# Patient Record
Sex: Male | Born: 1950 | Race: White | Hispanic: No | Marital: Married | Smoking: Former smoker
Health system: Southern US, Community
[De-identification: ages and names within clinical notes are randomized; demographics above are authoritative.]

## PROBLEM LIST (undated history)

## (undated) DIAGNOSIS — K5790 Diverticulosis of intestine, part unspecified, without perforation or abscess without bleeding: Secondary | ICD-10-CM

## (undated) DIAGNOSIS — Z8719 Personal history of other diseases of the digestive system: Secondary | ICD-10-CM

## (undated) DIAGNOSIS — J189 Pneumonia, unspecified organism: Secondary | ICD-10-CM

## (undated) DIAGNOSIS — K449 Diaphragmatic hernia without obstruction or gangrene: Secondary | ICD-10-CM

## (undated) DIAGNOSIS — Z974 Presence of external hearing-aid: Secondary | ICD-10-CM

## (undated) DIAGNOSIS — R269 Unspecified abnormalities of gait and mobility: Secondary | ICD-10-CM

## (undated) DIAGNOSIS — J310 Chronic rhinitis: Secondary | ICD-10-CM

## (undated) DIAGNOSIS — M1990 Unspecified osteoarthritis, unspecified site: Secondary | ICD-10-CM

## (undated) DIAGNOSIS — H9190 Unspecified hearing loss, unspecified ear: Secondary | ICD-10-CM

## (undated) DIAGNOSIS — G039 Meningitis, unspecified: Secondary | ICD-10-CM

## (undated) DIAGNOSIS — K08109 Complete loss of teeth, unspecified cause, unspecified class: Secondary | ICD-10-CM

## (undated) DIAGNOSIS — F329 Major depressive disorder, single episode, unspecified: Secondary | ICD-10-CM

## (undated) DIAGNOSIS — R7303 Prediabetes: Secondary | ICD-10-CM

## (undated) DIAGNOSIS — I498 Other specified cardiac arrhythmias: Secondary | ICD-10-CM

## (undated) DIAGNOSIS — I493 Ventricular premature depolarization: Secondary | ICD-10-CM

## (undated) DIAGNOSIS — F32A Depression, unspecified: Secondary | ICD-10-CM

## (undated) DIAGNOSIS — I77810 Thoracic aortic ectasia: Secondary | ICD-10-CM

## (undated) DIAGNOSIS — J302 Other seasonal allergic rhinitis: Secondary | ICD-10-CM

## (undated) DIAGNOSIS — Z860100 Personal history of colon polyps, unspecified: Secondary | ICD-10-CM

## (undated) DIAGNOSIS — Z9289 Personal history of other medical treatment: Secondary | ICD-10-CM

## (undated) DIAGNOSIS — N400 Enlarged prostate without lower urinary tract symptoms: Secondary | ICD-10-CM

## (undated) DIAGNOSIS — L409 Psoriasis, unspecified: Secondary | ICD-10-CM

## (undated) DIAGNOSIS — Z972 Presence of dental prosthetic device (complete) (partial): Secondary | ICD-10-CM

## (undated) DIAGNOSIS — I10 Essential (primary) hypertension: Secondary | ICD-10-CM

## (undated) DIAGNOSIS — K219 Gastro-esophageal reflux disease without esophagitis: Secondary | ICD-10-CM

## (undated) DIAGNOSIS — R3915 Urgency of urination: Secondary | ICD-10-CM

## (undated) DIAGNOSIS — D494 Neoplasm of unspecified behavior of bladder: Secondary | ICD-10-CM

## (undated) DIAGNOSIS — Z8619 Personal history of other infectious and parasitic diseases: Secondary | ICD-10-CM

## (undated) HISTORY — DX: Gastro-esophageal reflux disease without esophagitis: K21.9

## (undated) HISTORY — PX: SPINAL FUSION: SHX223

## (undated) HISTORY — DX: Personal history of colonic polyps: Z86.010

## (undated) HISTORY — PX: MULTIPLE TOOTH EXTRACTIONS: SHX2053

## (undated) HISTORY — DX: Psoriasis, unspecified: L40.9

## (undated) HISTORY — DX: Depression, unspecified: F32.A

## (undated) HISTORY — DX: Major depressive disorder, single episode, unspecified: F32.9

## (undated) HISTORY — PX: INGUINAL HERNIA REPAIR: SUR1180

## (undated) HISTORY — DX: Diverticulosis of intestine, part unspecified, without perforation or abscess without bleeding: K57.90

## (undated) HISTORY — DX: Personal history of colon polyps, unspecified: Z86.0100

## (undated) HISTORY — PX: TONSILLECTOMY: SUR1361

## (undated) HISTORY — DX: Chronic rhinitis: J31.0

## (undated) HISTORY — PX: ANKLE ARTHROSCOPY: SUR85

## (undated) HISTORY — PX: COLONOSCOPY: SHX174

## (undated) HISTORY — PX: INGUINAL LYMPH NODE BIOPSY: SHX5865

---

## 2001-04-12 ENCOUNTER — Ambulatory Visit (HOSPITAL_COMMUNITY): Admission: RE | Admit: 2001-04-12 | Discharge: 2001-04-12 | Payer: Self-pay | Admitting: *Deleted

## 2002-05-23 ENCOUNTER — Encounter: Payer: Self-pay | Admitting: Dermatology

## 2002-05-23 ENCOUNTER — Ambulatory Visit (HOSPITAL_COMMUNITY): Admission: RE | Admit: 2002-05-23 | Discharge: 2002-05-23 | Payer: Self-pay | Admitting: Dermatology

## 2002-07-05 ENCOUNTER — Ambulatory Visit (HOSPITAL_BASED_OUTPATIENT_CLINIC_OR_DEPARTMENT_OTHER): Admission: RE | Admit: 2002-07-05 | Discharge: 2002-07-05 | Payer: Self-pay | Admitting: General Surgery

## 2005-11-28 ENCOUNTER — Encounter: Admission: RE | Admit: 2005-11-28 | Discharge: 2005-11-28 | Payer: Self-pay | Admitting: Family Medicine

## 2006-11-07 DIAGNOSIS — Z8619 Personal history of other infectious and parasitic diseases: Secondary | ICD-10-CM

## 2006-11-07 HISTORY — DX: Personal history of other infectious and parasitic diseases: Z86.19

## 2007-02-09 ENCOUNTER — Ambulatory Visit (HOSPITAL_COMMUNITY): Admission: RE | Admit: 2007-02-09 | Discharge: 2007-02-09 | Payer: Self-pay | Admitting: Orthopedic Surgery

## 2007-02-10 ENCOUNTER — Ambulatory Visit: Payer: Self-pay | Admitting: Infectious Diseases

## 2007-02-10 ENCOUNTER — Inpatient Hospital Stay (HOSPITAL_COMMUNITY): Admission: AD | Admit: 2007-02-10 | Discharge: 2007-02-12 | Payer: Self-pay | Admitting: Orthopedic Surgery

## 2010-11-07 DIAGNOSIS — I493 Ventricular premature depolarization: Secondary | ICD-10-CM

## 2010-11-07 HISTORY — DX: Ventricular premature depolarization: I49.3

## 2010-12-29 ENCOUNTER — Emergency Department (HOSPITAL_COMMUNITY): Payer: BC Managed Care – PPO

## 2010-12-29 ENCOUNTER — Emergency Department (HOSPITAL_COMMUNITY)
Admission: EM | Admit: 2010-12-29 | Discharge: 2010-12-29 | Disposition: A | Discharge status: Home / Self Care | Payer: BC Managed Care – PPO | Attending: Emergency Medicine | Admitting: Emergency Medicine

## 2010-12-29 DIAGNOSIS — R42 Dizziness and giddiness: Secondary | ICD-10-CM | POA: Insufficient documentation

## 2010-12-29 DIAGNOSIS — R05 Cough: Secondary | ICD-10-CM | POA: Insufficient documentation

## 2010-12-29 DIAGNOSIS — I498 Other specified cardiac arrhythmias: Secondary | ICD-10-CM | POA: Insufficient documentation

## 2010-12-29 DIAGNOSIS — R059 Cough, unspecified: Secondary | ICD-10-CM | POA: Insufficient documentation

## 2010-12-29 DIAGNOSIS — R002 Palpitations: Secondary | ICD-10-CM | POA: Insufficient documentation

## 2010-12-29 DIAGNOSIS — I4949 Other premature depolarization: Secondary | ICD-10-CM | POA: Insufficient documentation

## 2010-12-29 DIAGNOSIS — R55 Syncope and collapse: Secondary | ICD-10-CM | POA: Insufficient documentation

## 2010-12-29 DIAGNOSIS — R0989 Other specified symptoms and signs involving the circulatory and respiratory systems: Secondary | ICD-10-CM | POA: Insufficient documentation

## 2010-12-29 DIAGNOSIS — R0602 Shortness of breath: Secondary | ICD-10-CM | POA: Insufficient documentation

## 2010-12-29 LAB — BASIC METABOLIC PANEL
BUN: 9 mg/dL (ref 6–23)
CO2: 27 mEq/L (ref 19–32)
Calcium: 9 mg/dL (ref 8.4–10.5)
Chloride: 107 mEq/L (ref 96–112)
Creatinine, Ser: 0.95 mg/dL (ref 0.4–1.5)
GFR calc Af Amer: >60 mL/min (ref >60)
GFR calc non Af Amer: >60 mL/min (ref >60)
Glucose, Bld: 97 mg/dL (ref 70–99)
Potassium: 4.5 mEq/L (ref 3.5–5.1)
Sodium: 142 mEq/L (ref 135–145)

## 2010-12-29 LAB — POCT CARDIAC MARKERS
CKMB, poc: 2.9 ng/mL (ref 1.0–8.0)
CKMB, poc: 1.5 ng/mL (ref 1.0–8.0)
CKMB, poc: 1.8 ng/mL (ref 1.0–8.0)
Myoglobin, poc: 73.5 ng/mL (ref 12–200)
Myoglobin, poc: 48.6 ng/mL (ref 12–200)
Myoglobin, poc: 74 ng/mL (ref 12–200)
Troponin i, poc: <0.05 ng/mL (ref 0.00–0.09)
Troponin i, poc: <0.05 ng/mL (ref 0.00–0.09)
Troponin i, poc: <0.05 ng/mL (ref 0.00–0.09)

## 2010-12-29 LAB — URINALYSIS, ROUTINE W REFLEX MICROSCOPIC
Bilirubin Urine: NEGATIVE
Hgb urine dipstick: NEGATIVE
Ketones, ur: 15 mg/dL — AB
Nitrite: NEGATIVE
Protein, ur: NEGATIVE mg/dL
Specific Gravity, Urine: 1.008 (ref 1.005–1.030)
Urine Glucose, Fasting: NEGATIVE mg/dL
Urobilinogen, UA: 0.2 mg/dL (ref 0.0–1.0)
pH: 8.5 — ABNORMAL HIGH (ref 5.0–8.0)

## 2010-12-29 LAB — DIFFERENTIAL
Basophils Absolute: 0.1 10*3/uL (ref 0.0–0.1)
Basophils Relative: 1 % (ref 0–1)
Eosinophils Absolute: 0.2 10*3/uL (ref 0.0–0.7)
Eosinophils Relative: 2 % (ref 0–5)
Lymphocytes Relative: 22 % (ref 12–46)
Lymphs Abs: 2.4 10*3/uL (ref 0.7–4.0)
Monocytes Absolute: 1.2 10*3/uL — ABNORMAL HIGH (ref 0.1–1.0)
Monocytes Relative: 11 % (ref 3–12)
Neutro Abs: 6.9 10*3/uL (ref 1.7–7.7)
Neutrophils Relative %: 65 % (ref 43–77)

## 2010-12-29 LAB — CBC
HCT: 45.1 % (ref 39.0–52.0)
Hemoglobin: 16.1 g/dL (ref 13.0–17.0)
MCH: 33.7 pg (ref 26.0–34.0)
MCHC: 35.7 g/dL (ref 30.0–36.0)
MCV: 94.4 fL (ref 78.0–100.0)
Platelets: 208 10*3/uL (ref 150–400)
RBC: 4.78 MIL/uL (ref 4.22–5.81)
RDW: 12.6 % (ref 11.5–15.5)
WBC: 10.7 10*3/uL — ABNORMAL HIGH (ref 4.0–10.5)

## 2010-12-29 LAB — MAGNESIUM: Magnesium: 2.1 mg/dL (ref 1.5–2.5)

## 2010-12-29 LAB — CK TOTAL AND CKMB (NOT AT ARMC)
CK, MB: 4.1 ng/mL — ABNORMAL HIGH (ref 0.3–4.0)
Relative Index: 2.8 — ABNORMAL HIGH (ref 0.0–2.5)
Total CK: 146 U/L (ref 7–232)

## 2010-12-29 LAB — TROPONIN I: Troponin I: 0.02 ng/mL (ref 0.00–0.06)

## 2011-01-03 NOTE — Consult Note (Signed)
NAME:  Jeremy Day, Jeremy Day NO.:  [id number]  MEDICAL RECORD NO.:  [id number]           PATIENT TYPE:  E  LOCATION:  [facility]                         FACILITY:  [facility]  PHYSICIAN:  [name], MD      DATE OF BIRTH:  08-02-1951  DATE OF CONSULTATION:  12/29/2010 DATE OF DISCHARGE:  12/29/2010                                CONSULTATION   REQUESTING PHYSICIAN:  Calvert Cantor, MD with [facility].  REASON FOR CONSULTATION:  Evaluation of ventricular bigeminy/abnormal EKG/dizziness.  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 During that period of time, he was also experiencing occasional "thumping" of his heart and felt at times slightly anxious and mildly dizzy.  His symptoms have resolved at our encounter.  After making it to his office, he called his wife and she urged him to go to the Urgent Care Center.  He was evaluated there where he was sent to the [facility] for further evaluation.  An EKG was performed, which did show frequent PVCs in a trigeminy/bigeminy pattern.  Once again, he feels as though these PVCs are symptomatic.  While interviewing him, I did observe a few PVCs, which he did not feel.  Point-of-care cardiac biomarkers are negative x2 and onset of abdominal fullness was at 9 a.m.  Once again in clarification, he does not describe any chest pain or chest fullness, just abdominal fullness which appears to be chronic.  He is also taking digestive enzymes for this and he had weaned himself off of his Prilosec, which he had been taken for years.   He is also taking six Tums throughout the day to assist them with his digestion.  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  PAST MEDICAL HISTORY: 1. Osteoarthritis. 2. Gastritis. 3. Prior right ankle staph infection. 4. Lower back pain. 5. Psoriasis.  No diabetes.  No hyperlipidemia.  No hypertension.  PAST SURGICAL HISTORY:  Right inguinal hernia repair, lymph node biopsy of left groin.  FAMILY HISTORY:  No early family history of coronary artery disease present.  SOCIAL HISTORY:  Nonsmoker.  No alcohol use.  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  REVIEW OF SYSTEMS:  He has had chronic sinus infections, but denies any recent decongestant use.  He does drink three to four cups of caffeine daily, occasionally has a cough, arthritis, but denies any melena, bleeding frank syncope, chest pain, or shortness of breath.  Anxiety and depression stress with his job.  Unless specified above, all other 12 review of systems negative.  PHYSICAL EXAMINATION:  VITAL SIGNS:  Blood pressure 110/83, pulse currently in the 70s with occasional ectopy/PVCs noted on telemetry, afebrile 97.5, respirations 18, satting 99% on room air. GENERAL:  Alert and oriented x3, very pleasant in no acute distress, sitting comfortably in bed, feeling in his usual state of health. EYES:  Well-perfused conjunctivae.  EOMI.  No scleral icterus. NECK:  Supple.  No lymphadenopathy.  No carotid bruits.  No JVD.  No thyromegaly. CARDIOVASCULAR:  Regular rate and rhythm without any appreciable murmurs, rubs or gallops. LUNGS:  Clear to auscultation bilaterally.  Normal respiratory effort. No wheezes.  No rales. ABDOMEN:  Soft, nontender.  No active bowel sounds.  No rebound or guarding. EXTREMITIES:  No clubbing, cyanosis or edema. GU:  Deferred. RECTAL:   Deferred. NEUROLOGIC:  Nonfocal.  No tremors are noted. PSYCHIATRY:  Normal affect.  DATA:  Two-point of cardiac biomarkers are normal.  Serum markers are currently pending.  Stat chest x-ray shows no acute findings.  EKG personally viewed shows sinus rhythm with frequent PVCs in a bigeminy/trigeminy pattern.  No ST-segment changes.  No Q-waves.  Normal R-wave progression is noted.  Heart rate of 63.  Lab work as above. Electrolytes unremarkable.  Magnesium normal.  White count 10.7, hemoglobin 16.1, hematocrit 45.1, platelet count 208.  ASSESSMENT AND PLAN:  A 60 year old male with no cardiac risk factors with EKG indicating bigeminy/trigeminy pattern with frequent premature ventricular contractions, recent abdominal fullness and bloating, mild dizziness. 1. Abnormal EKG/frequent premature ventricular contractions -     currently on monitor.  He is having rare ectopy, but occasionally     demonstrates a bigeminal pattern.  Some of these premature     ventricular contractions are symptomatic according to him.     Certainly if he was in a prolonged bigeminal pattern, his effective     heart rate would be in the 30s to 40s and may result in     lightheadedness.  Cardiac biomarkers are currently normal and a stat set of serum markers are pending.  If this stat set is negative, then I feel comfortable allowing him to be discharged from the emergency department with close followup with me in the office setting.  I will likely perform an echocardiogram on him as well as a Holter monitor to ensure that there is no evidence of any dangerous arrhythmias over a 24-hour period.  In addition, we will likely perform an exercise treadmill test on him to ensure that his sinoatrial node function is normal and to ensure that there is no evidence of overt ischemia.  Once again, he complains mostly of abdominal fullness, which appears to be quite chronic in nature with waxing and waning periods.  I  also wonder if he was having perhaps a mild viral gastroenteritis with his borderline elevated white count of 10.7.  Currently eating well without any difficulty. 1. Abdominal fullness - resolving, acute issue.  Continue to address     this with Dr. Gerri Spore, his primary physician. 2. Dizziness - may be partly secondary to mild dehydration.  I will     place a 24-hour Holter monitor to ensure that there are no     dangerous arrhythmias present.  With excessive ventricular bigeminy     as stated above, his effective heart rate may be overly decreased     and could cause some dizziness.  We will continue to monitor and     address.  I feel comfortable overall at this point if his stat serum cardiac biomarkers are  normal to allow him to be discharged from the emergency department and come back to my office for close evaluation.     [name], MD     MCS/MEDQ  D:  12/29/2010  T:  12/30/2010  Job:  [id number]  cc:   Otilio Connors. Gerri Spore, M.D. Calvert Cantor, M.D.  Electronically Signed by [name] MD on 01/03/2011 08:00:39 AM

## 2011-01-04 NOTE — Consult Note (Signed)
NAME:  Jeremy Day, Jeremy Day NO.:  [id number]  MEDICAL RECORD NO.:  [id number]           PATIENT TYPE:  E  LOCATION:  [facility]                         FACILITY:  [facility]  PHYSICIAN:  Calvert Cantor, M.D.     DATE OF BIRTH:  1951-05-18  DATE OF CONSULTATION: DATE OF DISCHARGE:                                CONSULTATION   REFERRING PHYSICIAN:  Eber Hong, MD  PRIMARY CARE PHYSICIAN:  Carola J. Gerri Spore, MD at [facility] [place].  PRESENTING COMPLAINT:  Palpitations, lightheadedness.  HISTORY OF PRESENT ILLNESS:  This is a 60 year old male with no cardiac history who was driving in his car this morning and felt a fullness in his stomach as if he had eaten a heavy meal.  It appeared to be pushing up against his chest.  He then noticed some thumping in his chest and the lightheadedness and pulled over.  He eventually made it to Urgent Care and was then referred to the ER, brought to the ER by EMS.  He was noted to be in bigeminy.  The patient states that his palpitations have improved but have not gone away.  He continues to be in bigeminy.  The patient admits to having significant amount of GI problems.  He was on omeprazole but states that he wanted to come off of it and therefore weaned himself off.  For the past 2 weeks, he has been taking Tums and states he takes about 10-12 a day and is still having significant GI issues.  He has never seen a gastroenterologist.  PAST MEDICAL HISTORY: 1. Psoriasis. 2. Arthritis. 3. Right ankle staph infection in 2006. 4. Skin cancer, surgically removed. 5. Lower back problems.  He states there is no pain but he has some     sort of disk problems. 6. Lymph node biopsy of the right groin area around 2001-2002.  This     was benign. 7. Right inguinal hernia repair in 1980s.  SOCIAL HISTORY:  Nonsmoker, nondrinker, teaches ballet, married.  ALLERGIES:  No known drug allergies but has occasionally had hives and he is not sure  what he was allergic to.  HOME MEDICATIONS: 1. Digestive enzymes 1 capsule three times a day with meals. 2. Multivitamin 1 tablet daily. 3. Nasonex both nostrils 1 spray daily as needed.  FAMILY HISTORY:  No history of heart disease.  He is not aware of any other medical illnesses in his family.  REVIEW OF SYSTEMS:  No recent weight loss or weight gain.  No fever, chills, or sweats.  No headache.  No sore throat, sinus trouble, earache.  He has had a dry cough for 4-6 weeks.  No shortness of breath. No chest pain.  He has had palpitations as mentioned in H and P.  No orthopnea.  No pedal edema.  GI:  As per H and P.  GU:  No dysuria, hematuria.  HEMATOLOGIC:  Bruises easily.  SKIN:  No rash. MUSCULOSKELETAL:  Has arthritis.  NEUROLOGIC:  Had some tingling in both of his thumbs on coming over here.  Otherwise no history of strokes or seizures.  PSYCHOLOGIC:  Has generalized stress but no significant depression or panic attacks  PHYSICAL EXAMINATION:  VITAL SIGNS:  Blood pressure 110/83, pulse 58, respiratory rate 18, temperature 97.5, oxygen saturation 99% on room air.  Pulse is fluctuating from 50s to the 80s. HEENT:  Pupils equal, round, reactive to light.  Extraocular movements are intact.  Conjunctivae is pink.  No scleral icterus.  Oral mucosa is moist.  Oropharynx clear. NECK:  Supple.  No thyromegaly, lymphadenopathy, carotid bruits. HEART:  Regular rate and rhythm.  No murmurs.  Slightly irregular rate and rhythm.  No murmurs, rubs or gallops. LUNGS:  Clear bilaterally.  Normal respiratory effort.  No use of accessory muscles. ABDOMEN:  Soft, nontender, nondistended.  Bowel sounds positive. EXTREMITIES:  No cyanosis, clubbing, or edema. NEUROLOGIC:  Cranial nerves II-XII intact.  Strength intact in all 4 extremities. PSYCHOLOGIC:  Awake, alert, oriented x3.  Mood and affect normal. SKIN:  Warm, dry, no rash, or bruising.  BLOOD WORK:  He has a normal metabolic panel,  magnesium is normal at 2.1.  Two sets of cardiac enzymes are negative and CBC is within normal limits.  UA reveals a small amount of ketones as 15 mg/dL.  EKG reveals bigeminy.  ASSESSMENT AND PLAN: 1. Near syncope with palpitations and lightheadedness, likely     secondary to his bigeminy.  He has had a cardiac eval in the ER.     He was evaluated by Dr. Jayme Cloud who is recommending that he be     discharged and follow up with him in the office.  He will be giving     him his information. 2. Bigeminy. 3. Dyspepsia.  I have recommended that the patient try either Pepcid     or Zantac if he does not want to restart his proton pump inhibitor.     He is currently very hesitant to go back on his PPI.  However, I     have told him it is not safe to take as many Tums as he is taking     on a daily basis.  Time taken on patient care was 80 minutes.     Calvert Cantor, M.D.     SR/MEDQ  D:  12/29/2010  T:  12/29/2010  Job:  [id number]  cc:   Otilio Connors. Gerri Spore, M.D.  Electronically Signed by Calvert Cantor M.D. on 01/04/2011 08:28:15 PM

## 2012-04-03 ENCOUNTER — Other Ambulatory Visit: Payer: Self-pay | Admitting: Gastroenterology

## 2014-04-16 ENCOUNTER — Other Ambulatory Visit: Payer: Self-pay | Admitting: Gastroenterology

## 2014-04-30 ENCOUNTER — Encounter: Payer: Self-pay | Admitting: Cardiology

## 2014-04-30 DIAGNOSIS — R12 Heartburn: Secondary | ICD-10-CM | POA: Insufficient documentation

## 2014-04-30 DIAGNOSIS — K219 Gastro-esophageal reflux disease without esophagitis: Secondary | ICD-10-CM | POA: Insufficient documentation

## 2014-04-30 DIAGNOSIS — M7540 Impingement syndrome of unspecified shoulder: Secondary | ICD-10-CM | POA: Insufficient documentation

## 2014-04-30 DIAGNOSIS — R1310 Dysphagia, unspecified: Secondary | ICD-10-CM | POA: Insufficient documentation

## 2015-07-15 ENCOUNTER — Encounter: Payer: Self-pay | Admitting: Infectious Disease

## 2015-07-15 ENCOUNTER — Ambulatory Visit (INDEPENDENT_AMBULATORY_CARE_PROVIDER_SITE_OTHER): Payer: Medicare Other | Admitting: Infectious Disease

## 2015-07-15 VITALS — BP 123/77 | HR 65 | Temp 98.1°F | Wt 197.0 lb

## 2015-07-15 DIAGNOSIS — Z23 Encounter for immunization: Secondary | ICD-10-CM

## 2015-07-15 DIAGNOSIS — M19271 Secondary osteoarthritis, right ankle and foot: Secondary | ICD-10-CM

## 2015-07-15 DIAGNOSIS — M009 Pyogenic arthritis, unspecified: Secondary | ICD-10-CM

## 2015-07-15 DIAGNOSIS — M129 Arthropathy, unspecified: Secondary | ICD-10-CM | POA: Diagnosis not present

## 2015-07-15 DIAGNOSIS — A4901 Methicillin susceptible Staphylococcus aureus infection, unspecified site: Secondary | ICD-10-CM | POA: Diagnosis not present

## 2015-07-15 DIAGNOSIS — M19171 Post-traumatic osteoarthritis, right ankle and foot: Secondary | ICD-10-CM | POA: Insufficient documentation

## 2015-07-15 DIAGNOSIS — S82891A Other fracture of right lower leg, initial encounter for closed fracture: Secondary | ICD-10-CM

## 2015-07-15 DIAGNOSIS — M19071 Primary osteoarthritis, right ankle and foot: Secondary | ICD-10-CM | POA: Insufficient documentation

## 2015-07-15 LAB — CBC WITH DIFFERENTIAL/PLATELET
Basophils Absolute: 0.1 10*3/uL (ref 0.0–0.1)
Basophils Relative: 1 % (ref 0–1)
Eosinophils Absolute: 0.3 10*3/uL (ref 0.0–0.7)
Eosinophils Relative: 4 % (ref 0–5)
HCT: 44.7 % (ref 39.0–52.0)
Hemoglobin: 15.5 g/dL (ref 13.0–17.0)
Lymphocytes Relative: 26 % (ref 12–46)
Lymphs Abs: 1.8 10*3/uL (ref 0.7–4.0)
MCH: 32.5 pg (ref 26.0–34.0)
MCHC: 34.7 g/dL (ref 30.0–36.0)
MCV: 93.7 fL (ref 78.0–100.0)
MPV: 9.2 fL (ref 8.6–12.4)
Monocytes Absolute: 0.9 10*3/uL (ref 0.1–1.0)
Monocytes Relative: 12 % (ref 3–12)
Neutro Abs: 4 10*3/uL (ref 1.7–7.7)
Neutrophils Relative %: 57 % (ref 43–77)
Platelets: 210 10*3/uL (ref 150–400)
RBC: 4.77 MIL/uL (ref 4.22–5.81)
RDW: 14.2 % (ref 11.5–15.5)
WBC: 7.1 10*3/uL (ref 4.0–10.5)

## 2015-07-15 LAB — BASIC METABOLIC PANEL WITH GFR
BUN: 15 mg/dL (ref 7–25)
CO2: 29 mmol/L (ref 20–31)
Calcium: 9.2 mg/dL (ref 8.6–10.3)
Chloride: 102 mmol/L (ref 98–110)
Creat: 0.75 mg/dL (ref 0.70–1.25)
GFR, Est African American: >89 mL/min (ref >=60)
GFR, Est Non African American: >89 mL/min (ref >=60)
Glucose, Bld: 98 mg/dL (ref 65–99)
Potassium: 4.5 mmol/L (ref 3.5–5.3)
Sodium: 140 mmol/L (ref 135–146)

## 2015-07-15 LAB — C-REACTIVE PROTEIN: CRP: <0.5 mg/dL (ref <0.60)

## 2015-07-15 NOTE — Progress Notes (Signed)
Consult: Is the patient's ankle still infected?  Requesting Physician: [name], MD   Subjective:    Patient ID: Jeremy Day, male    DOB: 05-26-1951, 64 y.o.   MRN: [id number]  HPI  64 year old with septic right ankle with retained loose body with infected peroneal sheath sp surgery with Dr. [name] on 02/13/2007 with arthroscopic debridement of ankle joint with removal of loose body. The communicated wound was opened and the area irrigated and debrided near the peroneal tendon sheath with 10 oh lysis of the peroneal tendons. Cultures were obtained intraoperatively and apparently yielded a staphylococcal  aureus species (the patient believes it was MSSA). He received at least 4 weeks of IV antibody etc. he does not recall which ones they were and was followed closely by Dr. [name]. Sedimentation rate and C-reactive protein normalized. The patient was never formally seen by [place] in infectious disease but Dr. [name] corresponded with my former senior colleague Dr. [name] who agreed with course of treatment and felt there was no need for further antibody beyond the IV course of the patient received. Since 2008 the patient has had no evidence of recurrence of infection. He does have pain with bearing weight on this leg with jumping and other activities that stress the joint. When at rest however he does not have pain he does not have fevers chills nausea malaise or weight loss to suggest ongoing infection.  He has been evaluated by some of the local orthopedic surgeons including Dr. [name] and Dr. [name] who is advised against ankle replacement surgery which the patient has been seeking. They apparently have been concerned that there still may be residual infection in the bone and that proceeding with ankle replacement surgery could run the risk of the patient developing hardware associated osteomyelitis. The patient was referred for 2nd opinion  to Dr. [name]  With practice in [place], [place].  Dr. [name] is contemplating placement of  "Star replacement joint."  The patient has since then had a CT of the ankle to assess bony alignment in anticipation of surgery.  The patient was referred to [place] by the primary care physician Dr. [name] at  request of the surgeon to help decide whether this patient still has evidence of active infection which could make placement of the replacement ankle hardware precarious.  Past Medical History  Diagnosis Date  . Chronic rhinitis   . Shoulder impingement syndrome   . Diverticulosis   . ETOH abuse   . Psoriasis   . Depression   . Irregular heart beat   . History of colon polyps   . DJD (degenerative joint disease)   . Arthritis   . GERD (gastroesophageal reflux disease)   . Arthritis of right ankle 07/15/2015    Past Surgical History  Procedure Laterality Date  . Biopsy of lymphnoid    . Staff infection right ankle    . Dental surgery     Social History:  reports that he has quit smoking. He does not have any smokeless tobacco history on file. He reports that he does not drink alcohol or use illicit drugs.   Family History  Problem Relation Age of Onset  . Other Father     low potassium     No Known Allergies   Current outpatient prescriptions:  .  aspirin 81 MG tablet, Take 81 mg by mouth daily., Disp: , Rfl:  .  cholecalciferol (VITAMIN D) 1000 UNITS tablet, Take 1,000 Units  by mouth daily., Disp: , Rfl:  .  clobetasol cream (TEMOVATE) 2.87 %, Apply 1 application topically 2 (two) times daily., Disp: , Rfl:  .  diclofenac (CATAFLAM) 50 MG tablet, Take 50 mg by mouth 3 (three) times daily., Disp: , Rfl:  .  DIGESTIVE ENZYMES PO, Take by mouth., Disp: , Rfl:  .  HYDROcodone-ibuprofen (VICOPROFEN) 7.5-200 MG per tablet, Take 1 tablet by mouth every 8 (eight) hours as needed for moderate pain., Disp: , Rfl:  .  mometasone (NASONEX) 50 MCG/ACT nasal spray, Place 2 sprays into the nose daily., Disp: , Rfl:  .  Nutritional Supplements  (PROTEIN SUPPLEMENT 80% PO), Take by mouth., Disp: , Rfl:  .  pimecrolimus (ELIDEL) 1 % cream, Apply 1 application topically once a week., Disp: , Rfl:  .  ranitidine (ZANTAC) 150 MG tablet, Take 150 mg by mouth 2 (two) times daily., Disp: , Rfl:  .  WHEY PROTEIN PO, Take by mouth., Disp: , Rfl:     Review of Systems  Constitutional: Negative for fever, chills, diaphoresis, activity change, appetite change, fatigue and unexpected weight change.  HENT: Negative for congestion, rhinorrhea, sinus pressure, sneezing, sore throat and trouble swallowing.   Eyes: Negative for photophobia and visual disturbance.  Respiratory: Negative for cough, chest tightness, shortness of breath, wheezing and stridor.   Cardiovascular: Negative for chest pain, palpitations and leg swelling.  Gastrointestinal: Negative for nausea, vomiting, abdominal pain, diarrhea, constipation, blood in stool, abdominal distention and anal bleeding.  Genitourinary: Negative for dysuria, hematuria, flank pain and difficulty urinating.  Musculoskeletal: Positive for arthralgias. Negative for myalgias, back pain, joint swelling and gait problem.  Skin: Negative for color change, pallor, rash and wound.  Neurological: Negative for dizziness, tremors, weakness and light-headedness.  Hematological: Negative for adenopathy. Does not bruise/bleed easily.  Psychiatric/Behavioral: Negative for behavioral problems, confusion, sleep disturbance, dysphoric mood, decreased concentration and agitation.       Objective:   Physical Exam  Constitutional: He is oriented to person, place, and time. He appears well-developed and well-nourished.  HENT:  Head: Normocephalic and atraumatic.  Eyes: Conjunctivae and EOM are normal.  Neck: Normal range of motion. Neck supple.  Cardiovascular: Normal rate and regular rhythm.   Pulmonary/Chest: Effort normal. No respiratory distress. He has no wheezes.  Abdominal: Soft. He exhibits no distension.    Musculoskeletal: Normal range of motion.       Feet:  Neurological: He is alert and oriented to person, place, and time.  Skin: Skin is warm and dry. No rash noted. No erythema. No pallor.  Psychiatric: He has a normal mood and affect. His behavior is normal. Judgment and thought content normal.  Nursing note and vitals reviewed.       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 This is certainly possible though the patient has gone for more than 8 years now without evidence of recurrent infection in the ankle. Specifically has not had increasing pain persistent pain at rest fevers swelling of the ankle or anything to suggest recurrent infection clinically. This does not however certainly eliminate the possibility that there could be some quiescent organisms deep in the bone. That is always a possibility.  However I feel clinically there is little to suggest evidence of significant infection in the joint, or foot.  I will check a  sedimentation rate and C-reactive protein along with a CBC and a copper has a metabolic panel today. CONSIDER getting an MRI of his ankle to further evaluate structures and to see if there is anything else to suggest infection based on radiographic images.  Certainly a fall the above is encouraging I would build assay with a fair amount of confidence that there is not any active infection and that there is certainly a much lower risk for proceeding with ankle replacement surgery then there would've been many years ago.  I have told the patient however that there is never 100% guarantee of hardware not becoming infected.  I told him if the hardware was placed by his surgeon and it became suddenly infected that this could lead to greater morbidity including potential loss of his limb.  Severe  osteo-arthritis of right ankle see above surgeon wants to replace with prosthetic ankle. See above discussion.  I spent greater than 60 minutes with the patient including greater than 50% of time in face to face counsel of the patient regarding his past MSSA septic ankle, his severe osteoarthritis and in coordination of their care.

## 2015-07-16 LAB — SEDIMENTATION RATE: Sed Rate: 1 mm/hr (ref 0–20)

## 2015-08-12 ENCOUNTER — Encounter: Payer: Self-pay | Admitting: Infectious Disease

## 2015-08-12 ENCOUNTER — Ambulatory Visit (INDEPENDENT_AMBULATORY_CARE_PROVIDER_SITE_OTHER): Payer: Medicare Other | Admitting: Infectious Disease

## 2015-08-12 VITALS — BP 108/67 | Wt 196.0 lb

## 2015-08-12 DIAGNOSIS — A4901 Methicillin susceptible Staphylococcus aureus infection, unspecified site: Secondary | ICD-10-CM

## 2015-08-12 DIAGNOSIS — M00071 Staphylococcal arthritis, right ankle and foot: Secondary | ICD-10-CM

## 2015-08-12 DIAGNOSIS — M19271 Secondary osteoarthritis, right ankle and foot: Secondary | ICD-10-CM

## 2015-08-12 DIAGNOSIS — M129 Arthropathy, unspecified: Secondary | ICD-10-CM

## 2015-08-12 DIAGNOSIS — S82891A Other fracture of right lower leg, initial encounter for closed fracture: Secondary | ICD-10-CM | POA: Diagnosis not present

## 2015-08-12 DIAGNOSIS — M19071 Primary osteoarthritis, right ankle and foot: Secondary | ICD-10-CM

## 2015-08-12 DIAGNOSIS — M19171 Post-traumatic osteoarthritis, right ankle and foot: Secondary | ICD-10-CM

## 2015-08-12 NOTE — Progress Notes (Signed)
Chief Complaint: followup for possible ankle infection  Subjective:    Patient ID: Jeremy Day, male    DOB: 08-12-1951, 64 y.o.   MRN: [id number]  HPI   64 year old with septic right ankle with retained loose body with infected peroneal sheath sp surgery with Dr. [name] on 02/13/2007 with arthroscopic debridement of ankle joint with removal of loose body. The communicated wound was opened and the area irrigated and debrided near the peroneal tendon sheath with 10 oh lysis of the peroneal tendons. Cultures were obtained intraoperatively and apparently yielded a staphylococcal  aureus species (the patient believes it was MSSA). He received at least 4 weeks of IV antibody etc. he does not recall which ones they were and was followed closely by Dr. [name]. Sedimentation rate and C-reactive protein normalized. The patient was never formally seen by [place] in infectious disease but Dr. [name] corresponded with my former senior colleague Dr. [name] who agreed with course of treatment and felt there was no need for further antibody beyond the IV course of the patient received. Since 2008 the patient has had no evidence of recurrence of infection. He does have pain with bearing weight on this leg with jumping and other activities that stress the joint. When at rest however he does not have pain he does not have fevers chills nausea malaise or weight loss to suggest ongoing infection.  He has been evaluated by some of the local orthopedic surgeons including Dr. [name] and Dr. [name] who is advised against ankle replacement surgery which the patient has been seeking. They apparently have been concerned that there still may be residual infection in the bone and that proceeding with ankle replacement surgery could run the risk of the patient developing hardware associated osteomyelitis. The patient was referred for 2nd opinion  to Dr. [name]  With practice in [place], [place]. Dr. [name] is contemplating placement  of  "Star replacement joint."  The patient has since then had a CT of the ankle to assess bony alignment in anticipation of surgery.  The patient was referred to [place] by the primary care physician Dr. [name] at  request of the surgeon to help decide whether this patient still has evidence of active infection which could make placement of the replacement ankle hardware precarious.   I examined him in late September and checked ESR, CRP and other labs that were normal and without laboratory evidence for infection. He comes in today and is doing well and without complaints. We discussed obtaining an MRI to get even more data  Past Medical History  Diagnosis Date  . Chronic rhinitis   . Shoulder impingement syndrome   . Diverticulosis   . ETOH abuse   . Psoriasis   . Depression   . Irregular heart beat   . History of colon polyps   . DJD (degenerative joint disease)   . Arthritis   . GERD (gastroesophageal reflux disease)   . Arthritis of right ankle 07/15/2015  . Septic arthritis of right ankle ([place]) 07/15/2015  . MSSA (methicillin susceptible Staphylococcus aureus) infection 07/15/2015  . Ankle fracture, right 07/15/2015  . Traumatic osteoarthritis of right ankle 07/15/2015    Past Surgical History  Procedure Laterality Date  . Biopsy of lymphnoid    . Staff infection right ankle    . Dental surgery     Social History:  reports that he has quit smoking. He does not have any smokeless tobacco history on file. He  reports that he does not drink alcohol or use illicit drugs.   Family History  Problem Relation Age of Onset  . Other Father     low potassium     No Known Allergies   Current outpatient prescriptions:  .  aspirin 81 MG tablet, Take 81 mg by mouth daily., Disp: , Rfl:  .  cholecalciferol (VITAMIN D) 1000 UNITS tablet, Take 1,000 Units by mouth daily., Disp: , Rfl:  .  clobetasol cream (TEMOVATE) 1.03 %, Apply 1 application topically 2 (two) times daily., Disp: , Rfl:  .   diclofenac (CATAFLAM) 50 MG tablet, Take 50 mg by mouth 3 (three) times daily., Disp: , Rfl:  .  DIGESTIVE ENZYMES PO, Take by mouth., Disp: , Rfl:  .  HYDROcodone-ibuprofen (VICOPROFEN) 7.5-200 MG per tablet, Take 1 tablet by mouth every 8 (eight) hours as needed for moderate pain., Disp: , Rfl:  .  mometasone (NASONEX) 50 MCG/ACT nasal spray, Place 2 sprays into the nose daily., Disp: , Rfl:  .  Nutritional Supplements (PROTEIN SUPPLEMENT 80% PO), Take by mouth., Disp: , Rfl:  .  pimecrolimus (ELIDEL) 1 % cream, Apply 1 application topically once a week., Disp: , Rfl:  .  ranitidine (ZANTAC) 150 MG tablet, Take 150 mg by mouth 2 (two) times daily., Disp: , Rfl:  .  WHEY PROTEIN PO, Take by mouth., Disp: , Rfl:     Review of Systems  Constitutional: Negative for fever, chills, diaphoresis, activity change, appetite change, fatigue and unexpected weight change.  HENT: Negative for congestion, rhinorrhea, sinus pressure, sneezing, sore throat and trouble swallowing.   Eyes: Negative for photophobia and visual disturbance.  Respiratory: Negative for cough, chest tightness, shortness of breath, wheezing and stridor.   Cardiovascular: Negative for chest pain, palpitations and leg swelling.  Gastrointestinal: Negative for nausea, vomiting, abdominal pain, diarrhea, constipation, blood in stool, abdominal distention and anal bleeding.  Genitourinary: Negative for dysuria, hematuria, flank pain and difficulty urinating.  Musculoskeletal: Positive for arthralgias. Negative for myalgias, back pain, joint swelling and gait problem.  Skin: Negative for color change, pallor, rash and wound.  Neurological: Negative for dizziness, tremors, weakness and light-headedness.  Hematological: Negative for adenopathy. Does not bruise/bleed easily.  Psychiatric/Behavioral: Negative for behavioral problems, confusion, sleep disturbance, dysphoric mood, decreased concentration and agitation.       Objective:    Physical Exam  Constitutional: He is oriented to person, place, and time. He appears well-developed and well-nourished.  HENT:  Head: Normocephalic and atraumatic.  Eyes: Conjunctivae and EOM are normal.  Neck: Normal range of motion. Neck supple.  Cardiovascular: Normal rate and regular rhythm.   Pulmonary/Chest: Effort normal. No respiratory distress. He has no wheezes.  Abdominal: Soft. He exhibits no distension.  Musculoskeletal: Normal range of motion.       Feet:  Neurological: He is alert and oriented to person, place, and time.  Skin: Skin is warm and dry. No rash noted. No erythema. No pallor.     Psychiatric: He has a normal mood and affect. His behavior is normal. Judgment and thought content normal.  Nursing note and vitals reviewed.       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 This is certainly possible though the patient has gone for more than 8 years now without evidence of recurrent infection  in the ankle. Specifically has not had increasing pain persistent pain at rest fevers swelling of the ankle or anything to suggest recurrent infection clinically. His ESR, CRP are normal as well   This does not however certainly eliminate the possibility that there could be some quiescent organisms deep in the bone. That is always a possibility.  However I feel clinically there is little to suggest evidence of significant infection in the joint, or foot.  We will also get an MRI of his ankle to further evaluate structures and to see if there is anything else to suggest infection based on radiographic images keeping in mind the MRI can be overly sensitive at times  Severe osteo-arthritis of right ankle see above surgeon wants to replace with prosthetic ankle. See above discussion.  Psoriasis: taking topical  clobetasol  I spent greater than 25 minutes with the patient including greater than 50% of time in face to face counsel of the patient regarding his past MSSA septic ankle, his severe osteoarthritis and in coordination of their care.

## 2015-08-21 ENCOUNTER — Telehealth: Payer: Self-pay | Admitting: Infectious Disease

## 2015-08-21 NOTE — Telephone Encounter (Signed)
Called to schedule patient's MRI of ankle and the representative stated she would call the patient directly because she needed to ask screening questions. Approval number is [id number] 08/20/2015 thru 09/18/2015

## 2015-09-07 ENCOUNTER — Ambulatory Visit
Admission: RE | Admit: 2015-09-07 | Discharge: 2015-09-07 | Disposition: A | Discharge status: Home / Self Care | Payer: Medicare Other | Source: Ambulatory Visit | Attending: Infectious Disease | Admitting: Infectious Disease

## 2015-09-07 DIAGNOSIS — M00071 Staphylococcal arthritis, right ankle and foot: Secondary | ICD-10-CM

## 2015-09-07 MED ORDER — GADOBENATE DIMEGLUMINE 529 MG/ML IV SOLN
19.0000 mL | Freq: Once | INTRAVENOUS | Status: AC | PRN
  Administered 2015-09-07: 19 mL via INTRAVENOUS

## 2015-09-07 MED ORDER — GADOBENATE DIMEGLUMINE 529 MG/ML IV SOLN: 19.0000 mL | Freq: Once | INTRAVENOUS | Status: DC | PRN

## 2015-09-21 ENCOUNTER — Ambulatory Visit (INDEPENDENT_AMBULATORY_CARE_PROVIDER_SITE_OTHER): Payer: Medicare Other | Admitting: Infectious Disease

## 2015-09-21 ENCOUNTER — Encounter: Payer: Self-pay | Admitting: Infectious Disease

## 2015-09-21 VITALS — BP 116/76 | HR 70 | Temp 97.8°F | Ht 72.0 in | Wt 196.8 lb

## 2015-09-21 DIAGNOSIS — A4901 Methicillin susceptible Staphylococcus aureus infection, unspecified site: Secondary | ICD-10-CM | POA: Diagnosis not present

## 2015-09-21 DIAGNOSIS — M00071 Staphylococcal arthritis, right ankle and foot: Secondary | ICD-10-CM

## 2015-09-21 DIAGNOSIS — M19271 Secondary osteoarthritis, right ankle and foot: Secondary | ICD-10-CM | POA: Diagnosis not present

## 2015-09-21 DIAGNOSIS — R12 Heartburn: Secondary | ICD-10-CM | POA: Diagnosis not present

## 2015-09-21 DIAGNOSIS — M19171 Post-traumatic osteoarthritis, right ankle and foot: Secondary | ICD-10-CM

## 2015-09-21 NOTE — Progress Notes (Signed)
CD received from Radiology.  Mailed CD to Dr. [name] in [place], [place] per the pt request.

## 2015-09-21 NOTE — Progress Notes (Signed)
Chief Complaint: followup for possible ankle infection  Subjective:    Patient ID: Jeremy Day, male    DOB: [birth year], 64 y.o.   MRN: [id number]  HPI   64 year old with septic right ankle with retained loose body with infected peroneal sheath sp surgery with Dr. [name] on 02/13/2007 with arthroscopic debridement of ankle joint with removal of loose body. The communicated wound was opened and the area irrigated and debrided near the peroneal tendon sheath with 10 oh lysis of the peroneal tendons. Cultures were obtained intraoperatively and apparently yielded a staphylococcal  aureus species (the patient believes it was MSSA). He received at least 4 weeks of IV antibody etc. he does not recall which ones they were and was followed closely by Dr. [name]. Sedimentation rate and C-reactive protein normalized. The patient was never formally seen by [place] in infectious disease but Dr. [name] corresponded with my former senior colleague Dr. [name] who agreed with course of treatment and felt there was no need for further antibody beyond the IV course of the patient received. Since 2008 the patient has had no evidence of recurrence of infection. He does have pain with bearing weight on this leg with jumping and other activities that stress the joint. When at rest however he does not have pain he does not have fevers chills nausea malaise or weight loss to suggest ongoing infection.  He has been evaluated by some of the local orthopedic surgeons including Dr. [name] and Dr. [name] who is advised against ankle replacement surgery which the patient has been seeking. They apparently have been concerned that there still may be residual infection in the bone and that proceeding with ankle replacement surgery could run the risk of the patient developing hardware associated osteomyelitis. The patient was referred for 2nd opinion  to Dr. [name]  With practice in [place], [place]. Dr. [name] is contemplating placement  of  "Star replacement joint."  The patient has since then had a CT of the ankle to assess bony alignment in anticipation of surgery.  The patient was referred to [place] by the primary care physician Dr. [name] at  request of the surgeon to help decide whether this patient still has evidence of active infection which could make placement of the replacement ankle hardware precarious.   I examined him in late September and checked ESR, CRP and other labs that were normal and without laboratory evidence for infection. He comes in today and is doing well and without complaints. We obtained an MRI to get even more data and this did NOT show any evidence of infection but showed:  MRI ankle  1. 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 2. Mild to moderate subtalar joint degenerative changes. 3. 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  Past Medical History  Diagnosis Date  . Chronic rhinitis   . Shoulder impingement syndrome   . Diverticulosis   . ETOH abuse   . Psoriasis   . Depression   . Irregular heart beat   . History of colon polyps   . DJD (degenerative joint disease)   . Arthritis   . GERD (gastroesophageal reflux disease)   . Arthritis of right ankle 07/15/2015  . Septic arthritis of right ankle ([place]) 07/15/2015  . MSSA (methicillin susceptible Staphylococcus aureus) infection 07/15/2015  . Ankle fracture, right 07/15/2015  . Traumatic osteoarthritis  of right ankle 07/15/2015    Past Surgical History  Procedure Laterality Date  . Biopsy of lymphnoid    . Staff infection right ankle    . Dental surgery     Social History:  reports that he has quit smoking. He does not have any smokeless tobacco history on file. He reports that he does not drink alcohol or use illicit drugs.   Family History  Problem Relation Age of Onset  .  Other Father     low potassium     No Known Allergies   Current outpatient prescriptions:  .  aspirin 81 MG tablet, Take 81 mg by mouth daily., Disp: , Rfl:  .  cholecalciferol (VITAMIN D) 1000 UNITS tablet, Take 1,000 Units by mouth daily., Disp: , Rfl:  .  clobetasol cream (TEMOVATE) 2.50 %, Apply 1 application topically 2 (two) times daily., Disp: , Rfl:  .  diclofenac (CATAFLAM) 50 MG tablet, Take 50 mg by mouth 3 (three) times daily., Disp: , Rfl:  .  DIGESTIVE ENZYMES PO, Take by mouth., Disp: , Rfl:  .  HYDROcodone-ibuprofen (VICOPROFEN) 7.5-200 MG per tablet, Take 1 tablet by mouth every 8 (eight) hours as needed for moderate pain., Disp: , Rfl:  .  mometasone (NASONEX) 50 MCG/ACT nasal spray, Place 2 sprays into the nose daily., Disp: , Rfl:  .  Nutritional Supplements (PROTEIN SUPPLEMENT 80% PO), Take by mouth., Disp: , Rfl:  .  pimecrolimus (ELIDEL) 1 % cream, Apply 1 application topically once a week., Disp: , Rfl:  .  ranitidine (ZANTAC) 150 MG tablet, Take 150 mg by mouth 2 (two) times daily., Disp: , Rfl:  .  WHEY PROTEIN PO, Take by mouth., Disp: , Rfl:     Review of Systems  Constitutional: Negative for fever, chills, diaphoresis, activity change, appetite change, fatigue and unexpected weight change.  HENT: Negative for congestion, rhinorrhea, sinus pressure, sneezing, sore throat and trouble swallowing.   Eyes: Negative for photophobia and visual disturbance.  Respiratory: Negative for cough, chest tightness, shortness of breath, wheezing and stridor.   Cardiovascular: Negative for chest pain, palpitations and leg swelling.  Gastrointestinal: Negative for nausea, vomiting, abdominal pain, diarrhea, constipation, blood in stool, abdominal distention and anal bleeding.  Genitourinary: Negative for dysuria, hematuria, flank pain and difficulty urinating.  Musculoskeletal: Positive for arthralgias. Negative for myalgias, back pain, joint swelling and gait problem.  Skin:  Negative for color change, pallor, rash and wound.  Neurological: Negative for dizziness, tremors, weakness and light-headedness.  Hematological: Negative for adenopathy. Does not bruise/bleed easily.  Psychiatric/Behavioral: Negative for behavioral problems, confusion, sleep disturbance, dysphoric mood, decreased concentration and agitation.       Objective:   Physical Exam  Constitutional: He is oriented to person, place, and time. He appears well-developed and well-nourished.  HENT:  Head: Normocephalic and atraumatic.  Eyes: Conjunctivae and EOM are normal.  Neck: Normal range of motion. Neck supple.  Cardiovascular: Normal rate and regular rhythm.   Pulmonary/Chest: Effort normal. No respiratory distress. He has no wheezes.  Abdominal: Soft. He exhibits no distension.  Musculoskeletal: Normal range of motion.       Feet:  Neurological: He is alert and oriented to person, place, and time.  Skin: Skin is warm and dry. No rash noted. No erythema. No pallor.     Psychiatric: He has a normal mood and affect. His behavior is normal. Judgment and thought content normal.  Nursing note and vitals reviewed.  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 This is certainly possible though the patient has gone for more than 8 years now without evidence of recurrent infection in the ankle. Specifically has not had increasing pain persistent pain at rest fevers swelling of the ankle or anything to suggest recurrent infection clinically. His ESR, CRP are normal as well   This does not however certainly eliminate the possibility that there could be some quiescent organisms deep in the bone. That is always a possibility.  However I feel clinically there is little to suggest evidence of significant infection in the joint, or  foot. Finally we have obtained an MRI and this ALSO does not sow evidence of infection  At this point in time we have done as much as is possible to exclude presence of any smoldering infection in the joint and if he is to pursure surgery it would seem that now seems to be the best time for him to have such surgery. Again we can never guarantee no recurrence of infection nor a new infection  Severe osteo-arthritis of right ankle see above surgeon wants to replace with prosthetic ankle. See above discussion.  Psoriasis: taking topical clobetasol

## 2016-03-27 NOTE — Progress Notes (Signed)
Cardiology Office Note    Date:  03/28/2016   ID:  Jeremy, Day December 14, 1950, MRN [id number]  PCP:  Jonathon Bellows, MD  Cardiologist:   Candee Furbish, MD     History of Present Illness:  Jeremy Day is a 65 y.o. male previously seen by myself in 2012 for consultation during hospitalization secondary to ventricular bigeminy/abnormal EKG/dizziness here for further evaluation of dilated aortic root previously 1.4-1.5 cm, palpitations.  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 Felt like a pushing on his diaphragm. During that period of time he felt occasional thumping of his heart and at times slightly anxious, mildly dizzy. After making into the office, he cold his wife urged him to go to urgent care center. He was sent to the emergency room for further evaluation at that time where her EKG showed frequent PVCs and trigeminy/bigeminy pattern. At that time, I observed a few PVCs on the monitor which she did not feel however he did state that he felt the sensation at home. Biomarkers were normal. No chest pain.  Ascending aorta.Dilated, 4.1-4.5 cm  Noting skipping again. Trying to stay hydrated. Mild SOB. Feels like he is in bad shape.  Denies any chest pain, fevers, chills, shortness of breath, orthopnea. No bleeding. No further syncopal episodes.  Past Medical History  Diagnosis Date  . Chronic rhinitis   . Shoulder impingement syndrome   . Diverticulosis   . ETOH abuse   . Psoriasis   . Depression   . Irregular heart beat   . History of colon polyps   . DJD (degenerative joint disease)   . Arthritis   . GERD (gastroesophageal reflux disease)   . Arthritis of right ankle 07/15/2015  . Septic arthritis of right  ankle ([place]) 07/15/2015  . MSSA (methicillin susceptible Staphylococcus aureus) infection 07/15/2015  . Ankle fracture, right 07/15/2015  . Traumatic osteoarthritis of right ankle 07/15/2015    Past Surgical History  Procedure Laterality Date  . Biopsy of lymphnoid    . Staff infection right ankle    . Dental surgery      Current Medications: Outpatient Prescriptions Prior to Visit  Medication Sig Dispense Refill  . aspirin 81 MG tablet Take 81 mg by mouth daily.    . cholecalciferol (VITAMIN D) 1000 UNITS tablet Take 1,000 Units by mouth daily.    . clobetasol cream (TEMOVATE) [id number] % Apply 1 application topically 2 (two) times daily.    . diclofenac (CATAFLAM) 50 MG tablet Take 50 mg by mouth 3 (three) times daily.    Marland Kitchen DIGESTIVE ENZYMES PO Take 1 tablet by mouth daily.     . mometasone (NASONEX) 50 MCG/ACT nasal spray Place 2 sprays into the nose daily as needed (sinuses).     . Nutritional Supplements (PROTEIN SUPPLEMENT 80% PO) Take 1 Can by mouth daily.     . pimecrolimus (ELIDEL) 1 % cream Apply 1 application topically once a week.    . ranitidine (ZANTAC) 150 MG tablet Take 150 mg by mouth 2 (two) times daily.    . WHEY PROTEIN PO Take 1 tablet by mouth daily.     Marland Kitchen HYDROcodone-ibuprofen (VICOPROFEN) 7.5-200 MG per tablet Take  1 tablet by mouth every 8 (eight) hours as needed for moderate pain.     No facility-administered medications prior to visit.     Allergies:   Review of patient's allergies indicates no known allergies.   Social History   Social History  . Marital Status: Married    Spouse Name: N/A  . Number of Children: N/A  . Years of Education: N/A   Social History Main Topics  . Smoking status: Former Research scientist (life sciences)  . Smokeless tobacco: None  . Alcohol Use: No  . Drug Use: No  . Sexual Activity: Not Asked   Other Topics Concern  . None   Social History Narrative     Family History:  No early family history of coronary artery disease *family history includes  Other in his father.   ROS:   Please see the history of present illness.   Right ankle pain, feels dehydrated, anxiety, depression ROS All other systems reviewed and are negative.   PHYSICAL EXAM:   VS:  BP 124/86 mmHg  Pulse 56  Ht 6' (1.829 m)  Wt 196 lb (88.905 kg)  BMI 26.58 kg/m2   GEN: Well nourished, well developed, in no acute distress HEENT: normal Neck: no JVD, carotid bruits, or masses Cardiac: RRR; no murmurs, rubs, or gallops,no edema , occasional ectopy. Respiratory:  clear to auscultation bilaterally, normal work of breathing GI: soft, nontender, nondistended, + BS MS: no deformity or atrophy Skin: warm and dry, no rash, right ankle injuries in the past. Left dorsalis pedis normal. Neuro:  Alert and Oriented x 3, Strength and sensation are intact Psych: euthymic mood, full affect  Wt Readings from Last 3 Encounters:  03/28/16 196 lb (88.905 kg)  09/21/15 196 lb 12 oz (89.245 kg)  08/12/15 196 lb (88.905 kg)      Studies/Labs Reviewed:   EKG:  Was not ordered today. Previous demonstrated PVCs  Recent Labs: 07/15/2015: BUN 15; Creat 0.75; Hemoglobin 15.5; Platelets 210; Potassium 4.5; Sodium 140   Lipid Panel No results found for: CHOL, TRIG, HDL, CHOLHDL, VLDL, LDLCALC, LDLDIRECT  Additional studies/ records that were reviewed today include:  Prior echocardiogram demonstrated dilated aortic root of 4.5 cm.    ASSESSMENT:    1. Dilated aortic root (HCC)   2. Palpitations   3. Premature ventricular contractions      PLAN:  In order of problems listed above:  Dilated aortic root  - Previously described as 4.1 4.5 cm. We will check echocardiogram. It is been several years. Contemplate CT angiogram in future as well for other objective measurement. Discussed when surgery potential, 5 cm range. Do not prescribe beta blocker because of low normal blood pressure. If blood pressure were to become elevated, beta blocker would be helpful to produce sheer  stress forces.  Palpitations -I did hear ectopy on exam. Previously described PVCs on monitor. I do not feel strongly that we need to pursue Holter monitor at this time. He is not having any significant symptoms from this. Continue to hydrate. Concerned about fluid status. Encouraged at least 1/2 L per day. Gatorade he has been drinking, good. Salts his food. Blood pressure has been excellent.    Medication Adjustments/Labs and Tests Ordered: Current medicines are reviewed at length with the patient today.  Concerns regarding medicines are outlined above.  Medication changes, Labs and Tests ordered today are listed in the Patient Instructions below. Patient Instructions  Medication Instructions:  The current medical regimen is effective;  continue present plan  and medications.  Testing/Procedures: Your physician has requested that you have an echocardiogram. Echocardiography is a painless test that uses sound waves to create images of your heart. It provides your doctor with information about the size and shape of your heart and how well your heart's chambers and valves are working. This procedure takes approximately one hour. There are no restrictions for this procedure.  Follow-Up: Follow up in 6 months with Dr. Marlou Porch.  You will receive a letter in the mail 2 months before you are due.  Please call us when you receive this letter to schedule your follow up appointment.  If you need a refill on your cardiac medications before your next appointment, please call your pharmacy.  Thank you for choosing [facility]!!           Signed, Candee Furbish, MD  03/28/2016 12:45 PM    [address], [place], [place]  [address] Phone: [phone]; Fax: [phone]

## 2016-03-28 ENCOUNTER — Ambulatory Visit (INDEPENDENT_AMBULATORY_CARE_PROVIDER_SITE_OTHER): Payer: Medicare Other | Admitting: Cardiology

## 2016-03-28 ENCOUNTER — Encounter: Payer: Self-pay | Admitting: Cardiology

## 2016-03-28 VITALS — BP 124/86 | HR 56 | Ht 72.0 in | Wt 196.0 lb

## 2016-03-28 DIAGNOSIS — R002 Palpitations: Secondary | ICD-10-CM | POA: Diagnosis not present

## 2016-03-28 DIAGNOSIS — I77810 Thoracic aortic ectasia: Secondary | ICD-10-CM

## 2016-03-28 DIAGNOSIS — I493 Ventricular premature depolarization: Secondary | ICD-10-CM

## 2016-03-28 NOTE — Patient Instructions (Signed)
Medication Instructions:  The current medical regimen is effective;  continue present plan and medications.  Testing/Procedures: Your physician has requested that you have an echocardiogram. Echocardiography is a painless test that uses sound waves to create images of your heart. It provides your doctor with information about the size and shape of your heart and how well your heart's chambers and valves are working. This procedure takes approximately one hour. There are no restrictions for this procedure.  Follow-Up: Follow up in 6 months with Dr. Skains.  You will receive a letter in the mail 2 months before you are due.  Please call us when you receive this letter to schedule your follow up appointment.  If you need a refill on your cardiac medications before your next appointment, please call your pharmacy.  Thank you for choosing [facility]!!       

## 2016-04-11 ENCOUNTER — Other Ambulatory Visit: Payer: Self-pay

## 2016-04-11 ENCOUNTER — Ambulatory Visit (HOSPITAL_COMMUNITY): Payer: Medicare Other | Attending: Cardiology

## 2016-04-11 DIAGNOSIS — I77810 Thoracic aortic ectasia: Secondary | ICD-10-CM | POA: Insufficient documentation

## 2016-04-11 DIAGNOSIS — Z87891 Personal history of nicotine dependence: Secondary | ICD-10-CM | POA: Diagnosis not present

## 2016-04-11 DIAGNOSIS — I517 Cardiomegaly: Secondary | ICD-10-CM | POA: Diagnosis not present

## 2016-04-11 DIAGNOSIS — R002 Palpitations: Secondary | ICD-10-CM | POA: Diagnosis not present

## 2016-04-11 LAB — ECHOCARDIOGRAM COMPLETE
Ao-asc: 38 cm
E decel time: 206 msec
E/e' ratio: 6.45
FS: 32 % (ref 28–44)
IVS/LV PW RATIO, ED: 1.61
LA ID, A-P, ES: 31 cm
LA diam end sys: 31 cm
LA diam index: 1.45 cm/m2
LA vol A4C: 27.4 ml
LA vol index: 17.8 mL/m2
LA vol: 38.1 cm3
LV E/e' medial: 6.45
LV E/e'average: 6.45
LV PW d: 11.6 mm — AB (ref 0.6–1.1)
LV e' LATERAL: 8.49 cm/s
LVOT SV: 80 cm3
LVOT VTI: 21.1 cm
LVOT area: 3.8 cm2
LVOT diameter: 22 mm
LVOT peak vel: 94.7 cm/s
Lateral S' vel: 12.6 cm/s
MV Dec: 206
MV pk A vel: 49.3 m/s
MV pk E vel: 54.8 m/s
RV sys press: 28 mmHg
Reg peak vel: 222 cm/s
TDI e' lateral: 8.49
TDI e' medial: 6.31
TR max vel: 222 m/s

## 2016-04-18 ENCOUNTER — Telehealth: Payer: Self-pay | Admitting: Cardiology

## 2016-04-18 NOTE — Telephone Encounter (Signed)
F/u  Pt returning RN phone call- echo results. Please call back and discuss.   

## 2016-04-18 NOTE — Telephone Encounter (Signed)
Reviewed results of echo with pt and answered all questions he had.  He will f/u in 09/2016 with Dr Marlou Porch.  He will c/b prior to then for any questions or concerns.

## 2016-11-14 DIAGNOSIS — R6889 Other general symptoms and signs: Secondary | ICD-10-CM | POA: Diagnosis not present

## 2016-11-14 DIAGNOSIS — R05 Cough: Secondary | ICD-10-CM | POA: Diagnosis not present

## 2017-05-02 DIAGNOSIS — I712 Thoracic aortic aneurysm, without rupture: Secondary | ICD-10-CM | POA: Diagnosis not present

## 2017-05-02 DIAGNOSIS — E782 Mixed hyperlipidemia: Secondary | ICD-10-CM | POA: Diagnosis not present

## 2017-05-02 DIAGNOSIS — Z1159 Encounter for screening for other viral diseases: Secondary | ICD-10-CM | POA: Diagnosis not present

## 2017-05-02 DIAGNOSIS — Z5181 Encounter for therapeutic drug level monitoring: Secondary | ICD-10-CM | POA: Diagnosis not present

## 2017-05-02 DIAGNOSIS — R739 Hyperglycemia, unspecified: Secondary | ICD-10-CM | POA: Diagnosis not present

## 2017-05-02 DIAGNOSIS — F330 Major depressive disorder, recurrent, mild: Secondary | ICD-10-CM | POA: Diagnosis not present

## 2017-05-02 DIAGNOSIS — K219 Gastro-esophageal reflux disease without esophagitis: Secondary | ICD-10-CM | POA: Diagnosis not present

## 2017-05-02 DIAGNOSIS — Z125 Encounter for screening for malignant neoplasm of prostate: Secondary | ICD-10-CM | POA: Diagnosis not present

## 2017-05-02 DIAGNOSIS — Z0000 Encounter for general adult medical examination without abnormal findings: Secondary | ICD-10-CM | POA: Diagnosis not present

## 2017-05-02 DIAGNOSIS — M150 Primary generalized (osteo)arthritis: Secondary | ICD-10-CM | POA: Diagnosis not present

## 2017-07-11 DIAGNOSIS — L820 Inflamed seborrheic keratosis: Secondary | ICD-10-CM | POA: Diagnosis not present

## 2017-07-11 DIAGNOSIS — D485 Neoplasm of uncertain behavior of skin: Secondary | ICD-10-CM | POA: Diagnosis not present

## 2017-07-11 DIAGNOSIS — L219 Seborrheic dermatitis, unspecified: Secondary | ICD-10-CM | POA: Diagnosis not present

## 2017-07-11 DIAGNOSIS — L309 Dermatitis, unspecified: Secondary | ICD-10-CM | POA: Diagnosis not present

## 2017-07-11 DIAGNOSIS — L570 Actinic keratosis: Secondary | ICD-10-CM | POA: Diagnosis not present

## 2018-01-18 DIAGNOSIS — Z85828 Personal history of other malignant neoplasm of skin: Secondary | ICD-10-CM | POA: Diagnosis not present

## 2018-01-18 DIAGNOSIS — L219 Seborrheic dermatitis, unspecified: Secondary | ICD-10-CM | POA: Diagnosis not present

## 2018-01-18 DIAGNOSIS — D1801 Hemangioma of skin and subcutaneous tissue: Secondary | ICD-10-CM | POA: Diagnosis not present

## 2018-01-18 DIAGNOSIS — L821 Other seborrheic keratosis: Secondary | ICD-10-CM | POA: Diagnosis not present

## 2018-01-18 DIAGNOSIS — L400 Psoriasis vulgaris: Secondary | ICD-10-CM | POA: Diagnosis not present

## 2018-01-18 DIAGNOSIS — L814 Other melanin hyperpigmentation: Secondary | ICD-10-CM | POA: Diagnosis not present

## 2018-01-18 DIAGNOSIS — L570 Actinic keratosis: Secondary | ICD-10-CM | POA: Diagnosis not present

## 2018-04-11 DIAGNOSIS — L570 Actinic keratosis: Secondary | ICD-10-CM | POA: Diagnosis not present

## 2018-05-18 DIAGNOSIS — F330 Major depressive disorder, recurrent, mild: Secondary | ICD-10-CM | POA: Diagnosis not present

## 2018-05-18 DIAGNOSIS — R739 Hyperglycemia, unspecified: Secondary | ICD-10-CM | POA: Diagnosis not present

## 2018-05-18 DIAGNOSIS — R399 Unspecified symptoms and signs involving the genitourinary system: Secondary | ICD-10-CM | POA: Diagnosis not present

## 2018-05-18 DIAGNOSIS — Z5181 Encounter for therapeutic drug level monitoring: Secondary | ICD-10-CM | POA: Diagnosis not present

## 2018-05-18 DIAGNOSIS — Z125 Encounter for screening for malignant neoplasm of prostate: Secondary | ICD-10-CM | POA: Diagnosis not present

## 2018-05-18 DIAGNOSIS — Z0000 Encounter for general adult medical examination without abnormal findings: Secondary | ICD-10-CM | POA: Diagnosis not present

## 2018-05-18 DIAGNOSIS — M150 Primary generalized (osteo)arthritis: Secondary | ICD-10-CM | POA: Diagnosis not present

## 2018-05-18 DIAGNOSIS — I712 Thoracic aortic aneurysm, without rupture: Secondary | ICD-10-CM | POA: Diagnosis not present

## 2018-05-18 DIAGNOSIS — E782 Mixed hyperlipidemia: Secondary | ICD-10-CM | POA: Diagnosis not present

## 2018-05-24 DIAGNOSIS — K219 Gastro-esophageal reflux disease without esophagitis: Secondary | ICD-10-CM | POA: Diagnosis not present

## 2018-05-24 DIAGNOSIS — Z86010 Personal history of colonic polyps: Secondary | ICD-10-CM | POA: Diagnosis not present

## 2018-05-24 DIAGNOSIS — R1310 Dysphagia, unspecified: Secondary | ICD-10-CM | POA: Diagnosis not present

## 2018-07-10 DIAGNOSIS — K2930 Chronic superficial gastritis without bleeding: Secondary | ICD-10-CM | POA: Diagnosis not present

## 2018-07-12 DIAGNOSIS — K222 Esophageal obstruction: Secondary | ICD-10-CM | POA: Diagnosis not present

## 2018-07-12 DIAGNOSIS — K210 Gastro-esophageal reflux disease with esophagitis: Secondary | ICD-10-CM | POA: Diagnosis not present

## 2018-07-12 DIAGNOSIS — R1310 Dysphagia, unspecified: Secondary | ICD-10-CM | POA: Diagnosis not present

## 2018-07-12 DIAGNOSIS — K2930 Chronic superficial gastritis without bleeding: Secondary | ICD-10-CM | POA: Diagnosis not present

## 2018-07-12 DIAGNOSIS — K228 Other specified diseases of esophagus: Secondary | ICD-10-CM | POA: Diagnosis not present

## 2018-07-12 DIAGNOSIS — Z86010 Personal history of colonic polyps: Secondary | ICD-10-CM | POA: Diagnosis not present

## 2018-07-18 DIAGNOSIS — K210 Gastro-esophageal reflux disease with esophagitis: Secondary | ICD-10-CM | POA: Diagnosis not present

## 2018-07-18 DIAGNOSIS — K2930 Chronic superficial gastritis without bleeding: Secondary | ICD-10-CM | POA: Diagnosis not present

## 2018-11-07 DIAGNOSIS — C801 Malignant (primary) neoplasm, unspecified: Secondary | ICD-10-CM

## 2018-11-07 HISTORY — DX: Malignant (primary) neoplasm, unspecified: C80.1

## 2018-12-18 DIAGNOSIS — R399 Unspecified symptoms and signs involving the genitourinary system: Secondary | ICD-10-CM | POA: Diagnosis not present

## 2019-01-24 DIAGNOSIS — R3121 Asymptomatic microscopic hematuria: Secondary | ICD-10-CM | POA: Diagnosis not present

## 2019-01-24 DIAGNOSIS — N401 Enlarged prostate with lower urinary tract symptoms: Secondary | ICD-10-CM | POA: Diagnosis not present

## 2019-01-24 DIAGNOSIS — R3912 Poor urinary stream: Secondary | ICD-10-CM | POA: Diagnosis not present

## 2019-01-24 DIAGNOSIS — R351 Nocturia: Secondary | ICD-10-CM | POA: Diagnosis not present

## 2019-01-24 DIAGNOSIS — R3915 Urgency of urination: Secondary | ICD-10-CM | POA: Diagnosis not present

## 2019-01-24 DIAGNOSIS — R310 Gross hematuria: Secondary | ICD-10-CM | POA: Diagnosis not present

## 2019-01-29 DIAGNOSIS — R310 Gross hematuria: Secondary | ICD-10-CM | POA: Diagnosis not present

## 2019-02-01 ENCOUNTER — Other Ambulatory Visit: Payer: Self-pay | Admitting: Urology

## 2019-02-01 ENCOUNTER — Encounter (HOSPITAL_COMMUNITY): Payer: Self-pay | Admitting: *Deleted

## 2019-02-01 ENCOUNTER — Encounter (HOSPITAL_COMMUNITY): Payer: Self-pay

## 2019-02-01 DIAGNOSIS — R310 Gross hematuria: Secondary | ICD-10-CM | POA: Diagnosis not present

## 2019-02-01 DIAGNOSIS — D414 Neoplasm of uncertain behavior of bladder: Secondary | ICD-10-CM | POA: Diagnosis not present

## 2019-02-01 NOTE — Patient Instructions (Addendum)
Jeremy Day  02/01/2019      Your procedure is scheduled on:  02-06-2019   Report to [facility] Main  Entrance,  Report to admitting at  6:30 AM    Call this number if you have problems the morning of surgery [phone]         Remember: Do not eat food or drink liquids :After Midnight.  This includes no water, candy, gum, mints  BRUSH YOUR TEETH MORNING OF SURGERY AND RINSE YOUR MOUTH OUT         Take these medicines the morning of surgery with A SIP OF WATER:   Famotidine (pepcid),  Tamsulosin (flomax),  Nasonex nasal spray                                    You may not have any metal on your body including piercings              Do not wear jewelry,  lotions, powders or perfumes, deodorant                          Men may shave face and neck.      Do not bring valuables to the hospital. [place].  Contacts, dentures or bridgework may not be worn into surgery.        Patients discharged the day of surgery will not be allowed to drive home. IF YOU ARE HAVING SURGERY AND GOING HOME THE SAME DAY, YOU MUST HAVE AN ADULT TO DRIVE YOU HOME AND BE WITH YOU FOR THE NEXT 24 HOURS AFTER SURGERY DUE TO ANESTHESIA. YOU MAY NOT GO HOME BY TAXI OR UBER  OR OTHERWISE, BUT AN ADULT MUST ACCOMPANY YOU HOME AND STAY WITH YOU FOR 24 HOURS.   Name and phone number of your driver:  Wife-- Jeremy Day  [phone]                _____________________________________________________________________             [name] - Preparing for Surgery Before surgery, you can play an important role.  Because skin is not sterile, your skin needs to be as free of germs as possible.  You can reduce the number of germs on your skin by washing with CHG (chlorahexidine gluconate) soap before surgery.  CHG is an antiseptic cleaner which kills germs and bonds with the skin to continue killing germs even after  washing. Please DO NOT use if you have an allergy to CHG or antibacterial soaps.  If your skin becomes reddened/irritated stop using the CHG and inform your nurse when you arrive at Short Stay. Do not shave (including legs and underarms) for at least 48 hours prior to the first CHG shower.  You may shave your face/neck. Please follow these instructions carefully:  1.  Shower with CHG Soap the night before surgery and the  morning of Surgery.  2.  If you choose to wash your hair, wash your hair first as usual with your  normal  shampoo.  3.  After you shampoo, rinse your hair and body thoroughly to remove the  shampoo.  4.  Use CHG as you would any other liquid soap.  You can apply chg directly  to the skin and wash                       Gently with a scrungie or clean washcloth.  5.  Apply the CHG Soap to your body ONLY FROM THE NECK DOWN.   Do not use on face/ open                           Wound or open sores. Avoid contact with eyes, ears mouth and genitals (private parts).                       Wash face,  Genitals (private parts) with your normal soap.             6.  Wash thoroughly, paying special attention to the area where your surgery  will be performed.  7.  Thoroughly rinse your body with warm water from the neck down.  8.  DO NOT shower/wash with your normal soap after using and rinsing off  the CHG Soap.             9.  Pat yourself dry with a clean towel.            10.  Wear clean pajamas.            11.  Place clean sheets on your bed the night of your first shower and do not  sleep with pets. Day of Surgery : Do not apply any lotions/deodorants the morning of surgery.  Please wear clean clothes to the hospital/surgery center.  FAILURE TO FOLLOW THESE INSTRUCTIONS MAY RESULT IN THE CANCELLATION OF YOUR SURGERY PATIENT SIGNATURE_________________________________  NURSE  SIGNATURE__________________________________  ________________________________________________________________________

## 2019-02-05 ENCOUNTER — Encounter (HOSPITAL_COMMUNITY): Payer: Self-pay

## 2019-02-05 ENCOUNTER — Other Ambulatory Visit: Payer: Self-pay

## 2019-02-05 ENCOUNTER — Encounter (HOSPITAL_COMMUNITY): Payer: Self-pay | Admitting: *Deleted

## 2019-02-05 ENCOUNTER — Encounter (HOSPITAL_COMMUNITY)
Admission: RE | Admit: 2019-02-05 | Discharge: 2019-02-05 | Disposition: A | Discharge status: Home / Self Care | Payer: PPO | Source: Ambulatory Visit | Attending: Urology | Admitting: Urology

## 2019-02-05 DIAGNOSIS — R9431 Abnormal electrocardiogram [ECG] [EKG]: Secondary | ICD-10-CM | POA: Diagnosis not present

## 2019-02-05 DIAGNOSIS — C679 Malignant neoplasm of bladder, unspecified: Secondary | ICD-10-CM | POA: Diagnosis not present

## 2019-02-05 DIAGNOSIS — Z01818 Encounter for other preprocedural examination: Secondary | ICD-10-CM | POA: Insufficient documentation

## 2019-02-05 DIAGNOSIS — R001 Bradycardia, unspecified: Secondary | ICD-10-CM | POA: Diagnosis not present

## 2019-02-05 HISTORY — DX: Personal history of other infectious and parasitic diseases: Z86.19

## 2019-02-05 HISTORY — DX: Ventricular premature depolarization: I49.3

## 2019-02-05 HISTORY — DX: Neoplasm of unspecified behavior of bladder: D49.4

## 2019-02-05 HISTORY — DX: Unspecified osteoarthritis, unspecified site: M19.90

## 2019-02-05 HISTORY — DX: Other seasonal allergic rhinitis: J30.2

## 2019-02-05 HISTORY — DX: Thoracic aortic ectasia: I77.810

## 2019-02-05 LAB — CBC
HCT: 47.5 % (ref 39.0–52.0)
Hemoglobin: 15.4 g/dL (ref 13.0–17.0)
MCH: 32.8 pg (ref 26.0–34.0)
MCHC: 32.4 g/dL (ref 30.0–36.0)
MCV: 101.3 fL — ABNORMAL HIGH (ref 80.0–100.0)
Platelets: 208 10*3/uL (ref 150–400)
RBC: 4.69 MIL/uL (ref 4.22–5.81)
RDW: 13.2 % (ref 11.5–15.5)
WBC: 6.6 10*3/uL (ref 4.0–10.5)
nRBC: 0 % (ref 0.0–0.2)

## 2019-02-05 NOTE — Anesthesia Preprocedure Evaluation (Addendum)
Anesthesia Evaluation  Patient identified by MRN, date of birth, ID band Patient awake    Reviewed: Allergy & Precautions, NPO status , Patient's Chart, lab work & pertinent test results  Airway Mallampati: II  TM Distance: >3 FB Neck ROM: Full    Dental no notable dental hx.    Pulmonary neg pulmonary ROS, former smoker,    Pulmonary exam normal breath sounds clear to auscultation       Cardiovascular negative cardio ROS Normal cardiovascular exam Rhythm:Regular Rate:Normal     Neuro/Psych Depression negative neurological ROS  negative psych ROS   GI/Hepatic Neg liver ROS, GERD  ,  Endo/Other  negative endocrine ROS  Renal/GU negative Renal ROS  negative genitourinary   Musculoskeletal  (+) Arthritis , Osteoarthritis,    Abdominal   Peds negative pediatric ROS (+)  Hematology negative hematology ROS (+)   Anesthesia Other Findings Bladder Cancer  Reproductive/Obstetrics negative OB ROS                            Anesthesia Physical Anesthesia Plan  ASA: III  Anesthesia Plan: General   Post-op Pain Management:    Induction: Intravenous  PONV Risk Score and Plan: 2 and Ondansetron, Midazolam and Treatment may vary due to age or medical condition  Airway Management Planned: LMA  Additional Equipment:   Intra-op Plan:   Post-operative Plan: Extubation in OR  Informed Consent: I have reviewed the patients History and Physical, chart, labs and discussed the procedure including the risks, benefits and alternatives for the proposed anesthesia with the patient or authorized representative who has indicated his/her understanding and acceptance.     Dental advisory given  Plan Discussed with: CRNA  Anesthesia Plan Comments: (See PAT note 02/05/19, Konrad Felix, PA-C)       Anesthesia Quick Evaluation

## 2019-02-05 NOTE — Progress Notes (Signed)
Final EKG pending dated 02-05-2019.   SPOKE W/  _  Pt at PAT appointment today     SCREENING SYMPTOMS OF COVID 19:   COUGH--  NO  RUNNY NOSE--- NO  SORE THROAT--- NO  SHORTNESS OF BREATH--- NO  DIFFICULTY BREATHING--- NO  TEMP >100.4----- NO  HAVE YOU OR ANY FAMILY MEMBER TRAVELLED PAST 14 DAYS OUT OF THE   COUNTY--- NO STATE---- NO COUNTRY---- NO  HAVE YOU OR ANY FAMILY MEMBER BEEN EXPOSED TO ANYONE WITH COVID 19?  NO

## 2019-02-05 NOTE — Progress Notes (Signed)
Anesthesia Chart Review   Case:  [id number] Date/Time:  02/06/19 0815   Procedure:  TRANSURETHRAL RESECTION OF BLADDER TUMOR (TURBT) (N/A )   Anesthesia type:  General   Pre-op diagnosis:  BLADDER TUMOR   Location:  [place] 09 / [facility] ORS   Surgeon:  Lucas Mallow, MD      DISCUSSION: 68 yo former smoker (quit 02/04/85) with h/o depression, GERD, PVC's, dilated aortic root (1mm on Echo 2017, stable with recheck in 2-3 years recommended), BPH, bladder tumor scheduled for above procedure 02/06/19 with Dr. Link Snuffer.   Asymptomatic at PAT visit 02/05/19.  Pt can proceed with planned procedure barring acute status change.  VS: BP 132/77   Pulse 65   Temp 36.4 C (Oral)   Resp 16   Ht 5\' 10"  (1.778 m)   Wt 89.4 kg   SpO2 99%   BMI 28.28 kg/m   PROVIDERS: [name], MD is PCP   Candee Furbish, MD is Cardiologist  LABS: Labs reviewed: Acceptable for surgery. (all labs ordered are listed, but only abnormal results are displayed)  Labs Reviewed  CBC - Abnormal; Notable for the following components:      Result Value   MCV 101.3 (*)    All other components within normal limits     IMAGES:   EKG: 02/05/2019 Rate 59 bpm Sinus bradycardia Cannot rule out inferior infarct, age undetermined Abnormal ECG   CV: Echo 04/11/2016 Study Conclusions  - Left ventricle: The cavity size was normal. There was mild focal   basal hypertrophy of the septum. The estimated ejection fraction   was 55%. Wall motion was normal; there were no regional wall   motion abnormalities. Doppler parameters are consistent with   abnormal left ventricular relaxation (grade 1 diastolic   dysfunction). - Aortic valve: Trileaflet. There was no stenosis. - Aorta: Mildly dilated aortic root and ascending aorta. Aortic   root dimension: 43 mm (ED). Ascending aortic diameter: 41 mm (S). - Mitral valve: There was no significant regurgitation. - Right ventricle: The cavity size was normal. Systolic function    was normal. - Tricuspid valve: Peak RV-RA gradient (S): 20 mm Hg. - Pulmonary arteries: PA peak pressure: 23 mm Hg (S). - Inferior vena cava: The vessel was normal in size. The   respirophasic diameter changes were in the normal range (>= 50%),   consistent with normal central venous pressure.  Impressions:  - Normal LV size with mild focal basal septal hypertrophy. EF 55%.   Normal RV size and systolic function. Mildly dilated aortic root   and ascending aortra. Trileaflet aortic valve without   regurgitation. Past Medical History:  Diagnosis Date  . Ascending aorta dilatation (HCC)    echo  04-11-2016  31mm and dilated aortc root 63mm  . Bladder tumor   . BPH (benign prostatic hyperplasia)   . Chronic rhinitis   . Depression   . Diverticulosis   . Full dentures   . GERD (gastroesophageal reflux disease)   . History of colon polyps   . History of staphylococcal infection 2008   right ankle  w/ sepsis  . OA (osteoarthritis)   . Psoriasis   . PVC's (premature ventricular contractions) 2012   hx bigeminy/ trigeminy with near syncope (cardiology consult note in epic dated 2012)  . Urgency of urination     Past Surgical History:  Procedure Laterality Date  . ANKLE ARTHROSCOPY Right 02-13-2007    dr [name]   w/ debridement and  removal loose body  . COLONOSCOPY  last one summer 2019  . INGUINAL HERNIA REPAIR Right 1980s  . INGUINAL LYMPH NODE BIOPSY  2001  approx.   benign    MEDICATIONS: . diclofenac (VOLTAREN) 75 MG EC tablet  . calcium carbonate (TUMS - DOSED IN MG ELEMENTAL CALCIUM) 500 MG chewable tablet  . clobetasol cream (TEMOVATE) 0.05 %  . diphenhydrAMINE (BENADRYL) 25 MG tablet  . famotidine (PEPCID) 20 MG tablet  . mometasone (NASONEX) 50 MCG/ACT nasal spray  . pimecrolimus (ELIDEL) 1 % cream  . Polyethyl Glycol-Propyl Glycol (SYSTANE OP)  . tamsulosin (FLOMAX) 0.4 MG CAPS capsule   No current facility-administered medications for this encounter.      Konrad Felix, PA-C [facility] Pre-Surgical Testing [phone] 02/05/19 2:00 PM

## 2019-02-06 ENCOUNTER — Observation Stay (HOSPITAL_COMMUNITY)
Admission: RE | Admit: 2019-02-06 | Discharge: 2019-02-07 | Disposition: A | Discharge status: Home / Self Care | Payer: PPO | Attending: Urology | Admitting: Urology

## 2019-02-06 ENCOUNTER — Encounter (HOSPITAL_COMMUNITY): Payer: Self-pay | Admitting: Emergency Medicine

## 2019-02-06 ENCOUNTER — Ambulatory Visit (HOSPITAL_COMMUNITY): Payer: PPO | Admitting: Physician Assistant

## 2019-02-06 ENCOUNTER — Encounter (HOSPITAL_COMMUNITY)
Admission: RE | Disposition: A | Discharge status: Home / Self Care | Payer: Self-pay | Source: Home / Self Care | Attending: Urology

## 2019-02-06 ENCOUNTER — Ambulatory Visit (HOSPITAL_COMMUNITY): Payer: PPO | Admitting: Certified Registered Nurse Anesthetist

## 2019-02-06 ENCOUNTER — Other Ambulatory Visit: Payer: Self-pay

## 2019-02-06 DIAGNOSIS — R188 Other ascites: Secondary | ICD-10-CM | POA: Diagnosis not present

## 2019-02-06 DIAGNOSIS — R310 Gross hematuria: Secondary | ICD-10-CM | POA: Insufficient documentation

## 2019-02-06 DIAGNOSIS — S72112A Displaced fracture of greater trochanter of left femur, initial encounter for closed fracture: Secondary | ICD-10-CM | POA: Diagnosis not present

## 2019-02-06 DIAGNOSIS — C678 Malignant neoplasm of overlapping sites of bladder: Principal | ICD-10-CM | POA: Insufficient documentation

## 2019-02-06 DIAGNOSIS — Z6834 Body mass index (BMI) 34.0-34.9, adult: Secondary | ICD-10-CM | POA: Diagnosis not present

## 2019-02-06 DIAGNOSIS — Z791 Long term (current) use of non-steroidal anti-inflammatories (NSAID): Secondary | ICD-10-CM | POA: Diagnosis not present

## 2019-02-06 DIAGNOSIS — M4322 Fusion of spine, cervical region: Secondary | ICD-10-CM | POA: Diagnosis not present

## 2019-02-06 DIAGNOSIS — D494 Neoplasm of unspecified behavior of bladder: Secondary | ICD-10-CM | POA: Diagnosis present

## 2019-02-06 DIAGNOSIS — I878 Other specified disorders of veins: Secondary | ICD-10-CM | POA: Diagnosis not present

## 2019-02-06 DIAGNOSIS — G893 Neoplasm related pain (acute) (chronic): Secondary | ICD-10-CM | POA: Diagnosis not present

## 2019-02-06 DIAGNOSIS — D696 Thrombocytopenia, unspecified: Secondary | ICD-10-CM | POA: Diagnosis not present

## 2019-02-06 DIAGNOSIS — E43 Unspecified severe protein-calorie malnutrition: Secondary | ICD-10-CM | POA: Diagnosis not present

## 2019-02-06 DIAGNOSIS — J019 Acute sinusitis, unspecified: Secondary | ICD-10-CM | POA: Diagnosis not present

## 2019-02-06 DIAGNOSIS — E6609 Other obesity due to excess calories: Secondary | ICD-10-CM | POA: Diagnosis not present

## 2019-02-06 DIAGNOSIS — I130 Hypertensive heart and chronic kidney disease with heart failure and stage 1 through stage 4 chronic kidney disease, or unspecified chronic kidney disease: Secondary | ICD-10-CM | POA: Diagnosis not present

## 2019-02-06 DIAGNOSIS — I25810 Atherosclerosis of coronary artery bypass graft(s) without angina pectoris: Secondary | ICD-10-CM | POA: Diagnosis not present

## 2019-02-06 DIAGNOSIS — B078 Other viral warts: Secondary | ICD-10-CM | POA: Diagnosis not present

## 2019-02-06 DIAGNOSIS — F5101 Primary insomnia: Secondary | ICD-10-CM | POA: Diagnosis not present

## 2019-02-06 DIAGNOSIS — F411 Generalized anxiety disorder: Secondary | ICD-10-CM | POA: Diagnosis not present

## 2019-02-06 DIAGNOSIS — R279 Unspecified lack of coordination: Secondary | ICD-10-CM | POA: Diagnosis not present

## 2019-02-06 DIAGNOSIS — E1151 Type 2 diabetes mellitus with diabetic peripheral angiopathy without gangrene: Secondary | ICD-10-CM | POA: Diagnosis not present

## 2019-02-06 DIAGNOSIS — J9611 Chronic respiratory failure with hypoxia: Secondary | ICD-10-CM | POA: Diagnosis not present

## 2019-02-06 DIAGNOSIS — N189 Chronic kidney disease, unspecified: Secondary | ICD-10-CM | POA: Diagnosis not present

## 2019-02-06 DIAGNOSIS — E039 Hypothyroidism, unspecified: Secondary | ICD-10-CM | POA: Diagnosis not present

## 2019-02-06 DIAGNOSIS — B351 Tinea unguium: Secondary | ICD-10-CM | POA: Diagnosis not present

## 2019-02-06 DIAGNOSIS — E7800 Pure hypercholesterolemia, unspecified: Secondary | ICD-10-CM | POA: Diagnosis not present

## 2019-02-06 DIAGNOSIS — R351 Nocturia: Secondary | ICD-10-CM | POA: Insufficient documentation

## 2019-02-06 DIAGNOSIS — A419 Sepsis, unspecified organism: Secondary | ICD-10-CM | POA: Diagnosis not present

## 2019-02-06 DIAGNOSIS — R3912 Poor urinary stream: Secondary | ICD-10-CM | POA: Diagnosis not present

## 2019-02-06 DIAGNOSIS — J9601 Acute respiratory failure with hypoxia: Secondary | ICD-10-CM | POA: Diagnosis not present

## 2019-02-06 DIAGNOSIS — I252 Old myocardial infarction: Secondary | ICD-10-CM | POA: Diagnosis not present

## 2019-02-06 DIAGNOSIS — I700 Atherosclerosis of aorta: Secondary | ICD-10-CM | POA: Diagnosis not present

## 2019-02-06 DIAGNOSIS — F0390 Unspecified dementia without behavioral disturbance: Secondary | ICD-10-CM | POA: Diagnosis not present

## 2019-02-06 DIAGNOSIS — R3915 Urgency of urination: Secondary | ICD-10-CM | POA: Diagnosis not present

## 2019-02-06 DIAGNOSIS — Z79899 Other long term (current) drug therapy: Secondary | ICD-10-CM | POA: Diagnosis not present

## 2019-02-06 DIAGNOSIS — K219 Gastro-esophageal reflux disease without esophagitis: Secondary | ICD-10-CM | POA: Insufficient documentation

## 2019-02-06 DIAGNOSIS — C9110 Chronic lymphocytic leukemia of B-cell type not having achieved remission: Secondary | ICD-10-CM | POA: Diagnosis not present

## 2019-02-06 DIAGNOSIS — J209 Acute bronchitis, unspecified: Secondary | ICD-10-CM | POA: Diagnosis not present

## 2019-02-06 DIAGNOSIS — C679 Malignant neoplasm of bladder, unspecified: Secondary | ICD-10-CM | POA: Diagnosis not present

## 2019-02-06 DIAGNOSIS — J329 Chronic sinusitis, unspecified: Secondary | ICD-10-CM | POA: Diagnosis not present

## 2019-02-06 DIAGNOSIS — I5032 Chronic diastolic (congestive) heart failure: Secondary | ICD-10-CM | POA: Diagnosis not present

## 2019-02-06 DIAGNOSIS — D630 Anemia in neoplastic disease: Secondary | ICD-10-CM | POA: Diagnosis not present

## 2019-02-06 DIAGNOSIS — N390 Urinary tract infection, site not specified: Secondary | ICD-10-CM | POA: Diagnosis not present

## 2019-02-06 DIAGNOSIS — E1152 Type 2 diabetes mellitus with diabetic peripheral angiopathy with gangrene: Secondary | ICD-10-CM | POA: Diagnosis not present

## 2019-02-06 DIAGNOSIS — G4700 Insomnia, unspecified: Secondary | ICD-10-CM | POA: Diagnosis not present

## 2019-02-06 DIAGNOSIS — J1289 Other viral pneumonia: Secondary | ICD-10-CM | POA: Diagnosis not present

## 2019-02-06 DIAGNOSIS — F321 Major depressive disorder, single episode, moderate: Secondary | ICD-10-CM | POA: Diagnosis not present

## 2019-02-06 DIAGNOSIS — Z5111 Encounter for antineoplastic chemotherapy: Secondary | ICD-10-CM | POA: Diagnosis not present

## 2019-02-06 DIAGNOSIS — K7290 Hepatic failure, unspecified without coma: Secondary | ICD-10-CM | POA: Diagnosis not present

## 2019-02-06 DIAGNOSIS — G90521 Complex regional pain syndrome I of right lower limb: Secondary | ICD-10-CM | POA: Diagnosis not present

## 2019-02-06 DIAGNOSIS — L821 Other seborrheic keratosis: Secondary | ICD-10-CM | POA: Diagnosis not present

## 2019-02-06 DIAGNOSIS — J84112 Idiopathic pulmonary fibrosis: Secondary | ICD-10-CM | POA: Diagnosis not present

## 2019-02-06 DIAGNOSIS — Z79891 Long term (current) use of opiate analgesic: Secondary | ICD-10-CM | POA: Diagnosis not present

## 2019-02-06 DIAGNOSIS — L408 Other psoriasis: Secondary | ICD-10-CM | POA: Diagnosis not present

## 2019-02-06 DIAGNOSIS — I96 Gangrene, not elsewhere classified: Secondary | ICD-10-CM | POA: Diagnosis not present

## 2019-02-06 DIAGNOSIS — Z7689 Persons encountering health services in other specified circumstances: Secondary | ICD-10-CM | POA: Diagnosis not present

## 2019-02-06 DIAGNOSIS — M79675 Pain in left toe(s): Secondary | ICD-10-CM | POA: Diagnosis not present

## 2019-02-06 DIAGNOSIS — M2042 Other hammer toe(s) (acquired), left foot: Secondary | ICD-10-CM | POA: Diagnosis not present

## 2019-02-06 DIAGNOSIS — L570 Actinic keratosis: Secondary | ICD-10-CM | POA: Diagnosis not present

## 2019-02-06 DIAGNOSIS — M79672 Pain in left foot: Secondary | ICD-10-CM | POA: Diagnosis not present

## 2019-02-06 DIAGNOSIS — R1031 Right lower quadrant pain: Secondary | ICD-10-CM | POA: Diagnosis not present

## 2019-02-06 DIAGNOSIS — C155 Malignant neoplasm of lower third of esophagus: Secondary | ICD-10-CM | POA: Diagnosis not present

## 2019-02-06 DIAGNOSIS — I8500 Esophageal varices without bleeding: Secondary | ICD-10-CM | POA: Diagnosis not present

## 2019-02-06 DIAGNOSIS — M47816 Spondylosis without myelopathy or radiculopathy, lumbar region: Secondary | ICD-10-CM | POA: Diagnosis not present

## 2019-02-06 DIAGNOSIS — R5381 Other malaise: Secondary | ICD-10-CM | POA: Diagnosis not present

## 2019-02-06 DIAGNOSIS — G894 Chronic pain syndrome: Secondary | ICD-10-CM | POA: Diagnosis not present

## 2019-02-06 DIAGNOSIS — S86811D Strain of other muscle(s) and tendon(s) at lower leg level, right leg, subsequent encounter: Secondary | ICD-10-CM | POA: Diagnosis not present

## 2019-02-06 DIAGNOSIS — I10 Essential (primary) hypertension: Secondary | ICD-10-CM | POA: Diagnosis not present

## 2019-02-06 DIAGNOSIS — K7581 Nonalcoholic steatohepatitis (NASH): Secondary | ICD-10-CM | POA: Diagnosis not present

## 2019-02-06 DIAGNOSIS — Z720 Tobacco use: Secondary | ICD-10-CM | POA: Diagnosis not present

## 2019-02-06 DIAGNOSIS — N179 Acute kidney failure, unspecified: Secondary | ICD-10-CM | POA: Diagnosis not present

## 2019-02-06 DIAGNOSIS — Z743 Need for continuous supervision: Secondary | ICD-10-CM | POA: Diagnosis not present

## 2019-02-06 DIAGNOSIS — Z794 Long term (current) use of insulin: Secondary | ICD-10-CM | POA: Diagnosis not present

## 2019-02-06 DIAGNOSIS — Z87891 Personal history of nicotine dependence: Secondary | ICD-10-CM | POA: Diagnosis not present

## 2019-02-06 DIAGNOSIS — J189 Pneumonia, unspecified organism: Secondary | ICD-10-CM | POA: Diagnosis not present

## 2019-02-06 DIAGNOSIS — E871 Hypo-osmolality and hyponatremia: Secondary | ICD-10-CM | POA: Diagnosis not present

## 2019-02-06 DIAGNOSIS — E1122 Type 2 diabetes mellitus with diabetic chronic kidney disease: Secondary | ICD-10-CM | POA: Diagnosis not present

## 2019-02-06 DIAGNOSIS — N401 Enlarged prostate with lower urinary tract symptoms: Secondary | ICD-10-CM | POA: Insufficient documentation

## 2019-02-06 DIAGNOSIS — G5750 Tarsal tunnel syndrome, unspecified lower limb: Secondary | ICD-10-CM | POA: Diagnosis not present

## 2019-02-06 DIAGNOSIS — Z6830 Body mass index (BMI) 30.0-30.9, adult: Secondary | ICD-10-CM | POA: Diagnosis not present

## 2019-02-06 DIAGNOSIS — E875 Hyperkalemia: Secondary | ICD-10-CM | POA: Diagnosis not present

## 2019-02-06 DIAGNOSIS — M1990 Unspecified osteoarthritis, unspecified site: Secondary | ICD-10-CM | POA: Diagnosis not present

## 2019-02-06 DIAGNOSIS — F329 Major depressive disorder, single episode, unspecified: Secondary | ICD-10-CM | POA: Insufficient documentation

## 2019-02-06 DIAGNOSIS — M6284 Sarcopenia: Secondary | ICD-10-CM | POA: Diagnosis not present

## 2019-02-06 DIAGNOSIS — R259 Unspecified abnormal involuntary movements: Secondary | ICD-10-CM | POA: Diagnosis not present

## 2019-02-06 DIAGNOSIS — I872 Venous insufficiency (chronic) (peripheral): Secondary | ICD-10-CM | POA: Diagnosis not present

## 2019-02-06 DIAGNOSIS — S32592A Other specified fracture of left pubis, initial encounter for closed fracture: Secondary | ICD-10-CM | POA: Diagnosis not present

## 2019-02-06 DIAGNOSIS — Z7189 Other specified counseling: Secondary | ICD-10-CM | POA: Diagnosis not present

## 2019-02-06 DIAGNOSIS — Z95828 Presence of other vascular implants and grafts: Secondary | ICD-10-CM | POA: Diagnosis not present

## 2019-02-06 HISTORY — DX: Complete loss of teeth, unspecified cause, unspecified class: K08.109

## 2019-02-06 HISTORY — DX: Complete loss of teeth, unspecified cause, unspecified class: Z97.2

## 2019-02-06 HISTORY — DX: Benign prostatic hyperplasia without lower urinary tract symptoms: N40.0

## 2019-02-06 HISTORY — PX: TRANSURETHRAL RESECTION OF BLADDER TUMOR: SHX2575

## 2019-02-06 HISTORY — DX: Urgency of urination: R39.15

## 2019-02-06 LAB — BASIC METABOLIC PANEL
Anion gap: 9 (ref 5–15)
BUN: 15 mg/dL (ref 8–23)
CO2: 23 mmol/L (ref 22–32)
Calcium: 8.1 mg/dL — ABNORMAL LOW (ref 8.9–10.3)
Chloride: 104 mmol/L (ref 98–111)
Creatinine, Ser: 0.95 mg/dL (ref 0.61–1.24)
GFR calc Af Amer: >60 mL/min (ref >60)
GFR calc non Af Amer: >60 mL/min (ref >60)
Glucose, Bld: 140 mg/dL — ABNORMAL HIGH (ref 70–99)
Potassium: 4 mmol/L (ref 3.5–5.1)
Sodium: 136 mmol/L (ref 135–145)

## 2019-02-06 LAB — HEMOGLOBIN AND HEMATOCRIT, BLOOD
HCT: 44.2 % (ref 39.0–52.0)
Hemoglobin: 14.7 g/dL (ref 13.0–17.0)

## 2019-02-06 SURGERY — TURBT (TRANSURETHRAL RESECTION OF BLADDER TUMOR)
Anesthesia: General | Site: Bladder

## 2019-02-06 MED ORDER — DEXAMETHASONE SODIUM PHOSPHATE 10 MG/ML IJ SOLN
INTRAMUSCULAR | Status: AC
  Filled 2019-02-06: qty 1

## 2019-02-06 MED ORDER — DOCUSATE SODIUM 100 MG PO CAPS
100.0000 mg | ORAL_CAPSULE | Freq: Two times a day (BID) | ORAL | Status: DC
  Filled 2019-02-06 (×2): qty 1

## 2019-02-06 MED ORDER — SODIUM CHLORIDE 0.9 % IR SOLN
Status: DC | PRN
  Administered 2019-02-06: 9000 mL

## 2019-02-06 MED ORDER — DEXAMETHASONE SODIUM PHOSPHATE 10 MG/ML IJ SOLN
INTRAMUSCULAR | Status: DC | PRN
  Administered 2019-02-06: 10 mg via INTRAVENOUS

## 2019-02-06 MED ORDER — ONDANSETRON HCL 4 MG/2ML IJ SOLN
INTRAMUSCULAR | Status: DC | PRN
  Administered 2019-02-06: 4 mg via INTRAVENOUS

## 2019-02-06 MED ORDER — HYDROMORPHONE HCL 1 MG/ML IJ SOLN
0.2500 mg | INTRAMUSCULAR | Status: DC | PRN
  Administered 2019-02-06: 0.5 mg via INTRAVENOUS

## 2019-02-06 MED ORDER — OXYCODONE HCL 5 MG/5ML PO SOLN: 5.0000 mg | Freq: Once | ORAL | Status: DC | PRN

## 2019-02-06 MED ORDER — SENNA 8.6 MG PO TABS
1.0000 | ORAL_TABLET | Freq: Two times a day (BID) | ORAL | Status: DC
  Filled 2019-02-06 (×2): qty 1

## 2019-02-06 MED ORDER — PROPOFOL 10 MG/ML IV BOLUS
INTRAVENOUS | Status: DC | PRN
  Administered 2019-02-06: 140 mg via INTRAVENOUS

## 2019-02-06 MED ORDER — OXYBUTYNIN CHLORIDE 5 MG PO TABS: 5.0000 mg | ORAL_TABLET | Freq: Three times a day (TID) | ORAL | Status: DC | PRN

## 2019-02-06 MED ORDER — SUGAMMADEX SODIUM 200 MG/2ML IV SOLN
INTRAVENOUS | Status: DC | PRN
  Administered 2019-02-06: 200 mg via INTRAVENOUS

## 2019-02-06 MED ORDER — HYDROMORPHONE HCL 1 MG/ML IJ SOLN: 0.5000 mg | INTRAMUSCULAR | Status: DC | PRN

## 2019-02-06 MED ORDER — LACTATED RINGERS IV SOLN
INTRAVENOUS | Status: DC
  Administered 2019-02-06 (×2): via INTRAVENOUS

## 2019-02-06 MED ORDER — LIDOCAINE 2% (20 MG/ML) 5 ML SYRINGE
INTRAMUSCULAR | Status: AC
  Filled 2019-02-06: qty 5

## 2019-02-06 MED ORDER — ROCURONIUM BROMIDE 10 MG/ML (PF) SYRINGE
PREFILLED_SYRINGE | INTRAVENOUS | Status: AC
  Filled 2019-02-06: qty 10

## 2019-02-06 MED ORDER — ACETAMINOPHEN 325 MG PO TABS
650.0000 mg | ORAL_TABLET | ORAL | Status: DC | PRN
  Administered 2019-02-06: 650 mg via ORAL
  Filled 2019-02-06: qty 2

## 2019-02-06 MED ORDER — BELLADONNA ALKALOIDS-OPIUM 16.2-60 MG RE SUPP: 1.0000 | Freq: Four times a day (QID) | RECTAL | Status: DC | PRN

## 2019-02-06 MED ORDER — ONDANSETRON HCL 4 MG/2ML IJ SOLN: 4.0000 mg | INTRAMUSCULAR | Status: DC | PRN

## 2019-02-06 MED ORDER — CEFAZOLIN SODIUM-DEXTROSE 2-4 GM/100ML-% IV SOLN
2.0000 g | INTRAVENOUS | Status: AC
  Administered 2019-02-06: 2 g via INTRAVENOUS
  Filled 2019-02-06: qty 100

## 2019-02-06 MED ORDER — HYDROMORPHONE HCL 1 MG/ML IJ SOLN
INTRAMUSCULAR | Status: AC
  Administered 2019-02-06: 12:00:00
  Filled 2019-02-06: qty 1

## 2019-02-06 MED ORDER — PROMETHAZINE HCL 25 MG/ML IJ SOLN: 6.2500 mg | INTRAMUSCULAR | Status: DC | PRN

## 2019-02-06 MED ORDER — SUCCINYLCHOLINE CHLORIDE 200 MG/10ML IV SOSY
PREFILLED_SYRINGE | INTRAVENOUS | Status: DC | PRN
  Administered 2019-02-06: 120 mg via INTRAVENOUS

## 2019-02-06 MED ORDER — OXYCODONE HCL 5 MG PO TABS: 5.0000 mg | ORAL_TABLET | Freq: Once | ORAL | Status: DC | PRN

## 2019-02-06 MED ORDER — FENTANYL CITRATE (PF) 100 MCG/2ML IJ SOLN
INTRAMUSCULAR | Status: AC
  Filled 2019-02-06: qty 2

## 2019-02-06 MED ORDER — DIPHENHYDRAMINE HCL 12.5 MG/5ML PO ELIX: 12.5000 mg | ORAL_SOLUTION | Freq: Four times a day (QID) | ORAL | Status: DC | PRN

## 2019-02-06 MED ORDER — POLYETHYL GLYCOL-PROPYL GLYCOL 0.4-0.3 % OP GEL: Freq: Two times a day (BID) | OPHTHALMIC | Status: DC | PRN

## 2019-02-06 MED ORDER — SUCCINYLCHOLINE CHLORIDE 200 MG/10ML IV SOSY
PREFILLED_SYRINGE | INTRAVENOUS | Status: AC
  Filled 2019-02-06: qty 10

## 2019-02-06 MED ORDER — ZOLPIDEM TARTRATE 5 MG PO TABS: 5.0000 mg | ORAL_TABLET | Freq: Every evening | ORAL | Status: DC | PRN

## 2019-02-06 MED ORDER — ONDANSETRON HCL 4 MG/2ML IJ SOLN
INTRAMUSCULAR | Status: AC
  Filled 2019-02-06: qty 2

## 2019-02-06 MED ORDER — TAMSULOSIN HCL 0.4 MG PO CAPS
0.4000 mg | ORAL_CAPSULE | Freq: Two times a day (BID) | ORAL | Status: DC
  Administered 2019-02-06 – 2019-02-07 (×2): 0.4 mg via ORAL
  Filled 2019-02-06 (×2): qty 1

## 2019-02-06 MED ORDER — CALCIUM CARBONATE ANTACID 500 MG PO CHEW
1.0000 | CHEWABLE_TABLET | ORAL | Status: DC | PRN
  Administered 2019-02-06: 200 mg via ORAL
  Filled 2019-02-06: qty 1

## 2019-02-06 MED ORDER — FAMOTIDINE 20 MG PO TABS
20.0000 mg | ORAL_TABLET | Freq: Every morning | ORAL | Status: DC
  Administered 2019-02-07: 20 mg via ORAL
  Filled 2019-02-06: qty 1

## 2019-02-06 MED ORDER — LIDOCAINE 2% (20 MG/ML) 5 ML SYRINGE
INTRAMUSCULAR | Status: DC | PRN
  Administered 2019-02-06: 60 mg via INTRAVENOUS

## 2019-02-06 MED ORDER — DIPHENHYDRAMINE HCL 50 MG/ML IJ SOLN: 12.5000 mg | Freq: Four times a day (QID) | INTRAMUSCULAR | Status: DC | PRN

## 2019-02-06 MED ORDER — SODIUM CHLORIDE 0.9 % IV SOLN
INTRAVENOUS | Status: DC
  Administered 2019-02-06 (×2): via INTRAVENOUS

## 2019-02-06 MED ORDER — OXYCODONE HCL 5 MG PO TABS: 5.0000 mg | ORAL_TABLET | ORAL | Status: DC | PRN

## 2019-02-06 MED ORDER — SODIUM CHLORIDE 0.9 % IR SOLN
3000.0000 mL | Status: AC
  Administered 2019-02-06 (×4): 3000 mL

## 2019-02-06 MED ORDER — FENTANYL CITRATE (PF) 100 MCG/2ML IJ SOLN
INTRAMUSCULAR | Status: DC | PRN
  Administered 2019-02-06: 75 ug via INTRAVENOUS
  Administered 2019-02-06 (×2): 50 ug via INTRAVENOUS
  Administered 2019-02-06: 25 ug via INTRAVENOUS

## 2019-02-06 MED ORDER — BACITRACIN-NEOMYCIN-POLYMYXIN 400-5-5000 EX OINT: 1.0000 "application " | TOPICAL_OINTMENT | Freq: Three times a day (TID) | CUTANEOUS | Status: DC | PRN

## 2019-02-06 MED ORDER — HYDROCODONE-ACETAMINOPHEN 5-325 MG PO TABS: 1.0000 | ORAL_TABLET | ORAL | 0 refills | Status: DC | PRN

## 2019-02-06 MED ORDER — PROPOFOL 10 MG/ML IV BOLUS
INTRAVENOUS | Status: AC
  Filled 2019-02-06: qty 20

## 2019-02-06 MED ORDER — ROCURONIUM BROMIDE 100 MG/10ML IV SOLN
INTRAVENOUS | Status: DC | PRN
  Administered 2019-02-06: 10 mg via INTRAVENOUS
  Administered 2019-02-06: 40 mg via INTRAVENOUS
  Administered 2019-02-06 (×2): 10 mg via INTRAVENOUS

## 2019-02-06 SURGICAL SUPPLY — 14 items
BAG URINE DRAINAGE (UROLOGICAL SUPPLIES) IMPLANT
BAG URO CATCHER STRL LF (MISCELLANEOUS) ×2 IMPLANT
COVER WAND RF STERILE (DRAPES) IMPLANT
ELECT REM PT RETURN 15FT ADLT (MISCELLANEOUS) ×2 IMPLANT
GLOVE BIO SURGEON STRL SZ7.5 (GLOVE) ×2 IMPLANT
GOWN STRL REUS W/TWL LRG LVL3 (GOWN DISPOSABLE) ×2 IMPLANT
KIT TURNOVER KIT A (KITS) IMPLANT
LOOP CUT BIPOLAR 24F LRG (ELECTROSURGICAL) IMPLANT
MANIFOLD NEPTUNE II (INSTRUMENTS) ×2 IMPLANT
PACK CYSTO (CUSTOM PROCEDURE TRAY) ×2 IMPLANT
SET ASPIRATION TUBING (TUBING) IMPLANT
SYRINGE IRR TOOMEY STRL 70CC (SYRINGE) IMPLANT
TUBING CONNECTING 10 (TUBING) ×2 IMPLANT
TUBING UROLOGY SET (TUBING) ×2 IMPLANT

## 2019-02-06 NOTE — Transfer of Care (Signed)
Immediate Anesthesia Transfer of Care Note  Patient: Jeremy Day  Procedure(s) Performed: TRANSURETHRAL RESECTION OF BLADDER TUMOR (TURBT) (N/A Bladder)  Patient Location: PACU  Anesthesia Type:General  Level of Consciousness: awake and alert   Airway & Oxygen Therapy: Patient Spontanous Breathing and Patient connected to face mask oxygen  Post-op Assessment: Report given to RN and Post -op Vital signs reviewed and stable  Post vital signs: Reviewed and stable  Last Vitals:  Vitals Value Taken Time  BP 120/80 02/06/2019 10:30 AM  Temp    Pulse 64 02/06/2019 10:30 AM  Resp 13 02/06/2019 10:30 AM  SpO2 100 % 02/06/2019 10:30 AM  Vitals shown include unvalidated device data.  Last Pain:  Vitals:   02/06/19 0650  TempSrc:   PainSc: 1       Patients Stated Pain Goal: 4 (37/94/32 7614)  Complications: No apparent anesthesia complications

## 2019-02-06 NOTE — Op Note (Signed)
Operative Note  Preoperative diagnosis:  1.  Bladder tumor  Postoperative diagnosis: 1.  Bladder tumor--large, multiple  Procedure(s): 1.  Transurethral resection of bladder tumor--large  Surgeon: Link Snuffer, MD  Assistants: None  Anesthesia: General  Complications: None immediate  EBL: 50 cc  Specimens: 1.  Bladder tumor  Drains/Catheters: 1.  22 French three-way Foley catheter  Intraoperative findings: 1.  Normal anterior urethra 2.  Mildly obstructing prostate with bilobar hypertrophy.  Tumor encompassed the majority of the bladder neck.  There was tumor throughout the bladder.  I was unable to locate ureteral orifices but resection appeared to be away from this area.  There is tumor on the posterior wall, both lateral walls, and the anterior bladder wall.  Tumor burden was extensive and unable to be completely resected.  This appeared to be high-grade and invasive.  Resection was somewhat deep but no evidence of obvious perforation.  Largest tumor was greater than 5 cm.  Indication: 68 year old male evaluated for gross hematuria found to have multiple bladder tumors presents for the previously mentioned operation.  Description of procedure:  The patient was identified and consent was obtained.  The patient was taken to the operating room and placed in the supine position.  The patient was placed under general anesthesia.  Perioperative antibiotics were administered.  The patient was placed in dorsal lithotomy.  Patient was prepped and draped in a standard sterile fashion and a timeout was performed.  A 26 French resectoscope with the visual obturator in place was advanced into the urethra and into the bladder.  Complete cystoscopy was performed with the findings noted above.  I exchanged this for the working element.  On bipolar settings, I resected a great deal of tumor on the posterior and lateral walls as well as towards the dome.  Also resected the bladder neck and  resected a portion of the prostate.  I fulgurated the resection beds throughout.  At the end, there was a very small amount of bleeding but there appeared to be no significant bleeding.  I collected all bladder specimen and reinspected the bladder and fulgurated some more mucosa.  There was a very small amount of bleeding and nothing significant but I left a 28 [place] three-way catheter in the event that he needs continuous bladder irrigation.  Plan: He will remain overnight on observation.  I will try to avoid continuous bladder irrigation but if he needs it, I will start it.  Obtain labs today and tomorrow.  Return in 1 week for a voiding trial.

## 2019-02-06 NOTE — H&P (Signed)
CC/HPI: CC: Lower urinary tract symptoms, gross hematuria  HPI:  01/24/2019  68 year old male presents with a primary complaint of urgency to urinate with low volume output. He also complains about mildly weak stream, nocturia 3 in difficulty postponing urination. AUA symptom score is 15. He denies a history of urinary tract infections except for once in the 1970s. He denies current hematuria or dysuria. Incidentally, he notes a 2 year history of intermittent gross hematuria. Most recently, the gross hematuria symptoms have increased and he had 3 episodes of gross hematuria last week. He has never had this worked up. He is asymptomatic in this regard. He started Flomax about 2 weeks ago and has not noticed much of a difference.   02/01/2019  Patient underwent a CT IVP. This revealed multiple filling defects in his bladder consistent with likely tumors. Kidney and ureter were normal. PSA at the last visit was normal.     ALLERGIES: None   MEDICATIONS: Phenazopyridine Hcl  Tamsulosin Hcl 0.4 mg capsule 1 capsule PO BID  Tamsulosin Hcl 0.4 mg capsule  Calcium Carbonate  Clobetasol Propionate  Diclofenac Sodium  Diphenhydramine Hcl 25 mg capsule  Famotidine 20 mg tablet  Mometasone Furoate  Pimecrolimus 1 % cream     GU PSH: Locm 300-399Mg /Ml Iodine,1Ml - 01/29/2019    NON-GU PSH: None   GU PMH: BPH w/LUTS - 01/24/2019 Encounter for Prostate Cancer screening - 01/24/2019 Gross hematuria - 01/24/2019 Nocturia - 01/24/2019 Urinary Urgency - 01/24/2019 Weak Urinary Stream - 01/24/2019    NON-GU PMH: None   FAMILY HISTORY: None   SOCIAL HISTORY: None   REVIEW OF SYSTEMS:    GU Review Male:   Patient denies frequent urination, hard to postpone urination, burning/ pain with urination, get up at night to urinate, leakage of urine, stream starts and stops, trouble starting your stream, have to strain to urinate , erection problems, and penile pain.  Gastrointestinal (Upper):   Patient denies  nausea, vomiting, and indigestion/ heartburn.  Gastrointestinal (Lower):   Patient denies diarrhea and constipation.  Constitutional:   Patient denies fever, night sweats, weight loss, and fatigue.  Skin:   Patient denies skin rash/ lesion and itching.  Eyes:   Patient denies double vision and blurred vision.  Ears/ Nose/ Throat:   Patient denies sore throat and sinus problems.  Hematologic/Lymphatic:   Patient denies swollen glands and easy bruising.  Cardiovascular:   Patient denies leg swelling and chest pains.  Respiratory:   Patient denies cough and shortness of breath.  Endocrine:   Patient denies excessive thirst.  Musculoskeletal:   Patient denies back pain and joint pain.  Neurological:   Patient denies headaches and dizziness.  Psychologic:   Patient denies depression and anxiety.   VITAL SIGNS:      02/01/2019 11:41 AM  BP 122/70 mmHg  Pulse 70 /min  Temperature 96.9 F / 36.0 C   MULTI-SYSTEM PHYSICAL EXAMINATION:    Constitutional: Well-nourished. No physical deformities. Normally developed. Good grooming.  Respiratory: No labored breathing, no use of accessory muscles.   Cardiovascular: Normal temperature, adequate perfusion of extremities  Skin: No paleness, no jaundice  Neurologic / Psychiatric: Oriented to time, oriented to place, oriented to person. No depression, no anxiety, no agitation.  Gastrointestinal: No mass, no tenderness, no rigidity, non obese abdomen.  Eyes: Normal conjunctivae. Normal eyelids.  Musculoskeletal: Normal gait and station of head and neck.     PAST DATA REVIEWED:  Source Of History:  Patient  Lab Test  Review:   PSA  Records Review:   Previous Patient Records  X-Ray Review: C.T. Abdomen/Pelvis: Reviewed Films. Reviewed Report. Discussed With Patient.     01/24/19  PSA  Total PSA 2.93 ng/mL    PROCEDURES:         Flexible Cystoscopy - 52000  Risks, benefits, and some of the potential complications of the procedure were discussed at  length with the patient including infection, bleeding, voiding discomfort, urinary retention, fever, chills, sepsis, and others. All questions were answered. Informed consent was obtained. Antibiotic prophylaxis was given. Sterile technique and intraurethral analgesia were used.  Meatus:  Normal size. Normal location. Normal condition.  Urethra:  No strictures.  External Sphincter:  Normal.  Verumontanum:  Normal.  Prostate:  Obstructing. Moderate hyperplasia.  Bladder Neck:  Non-obstructing.  Ureteral Orifices:  I was unable to visualize the ureteral orifices. They may be involved with tumor  Bladder:  The patient had many tumors papillary in nature consistent with bladder cancer. Largest was about 5 cm. These were located throughout the bladder and there was probably 10-20 tumors some of them of which were quite large. There were tumors around the bladder neck, posterior wall, dome of the bladder, anterior bladder wall, and both lateral walls.      The lower urinary tract was carefully examined. The procedure was well-tolerated and without complications. Antibiotic instructions were given. Instructions were given to call the office immediately for bloody urine, difficulty urinating, urinary retention, painful or frequent urination, fever, chills, nausea, vomiting or other illness. The patient stated that he understood these instructions and would comply with them.         Urinalysis w/Scope Dipstick Dipstick Cont'd Micro  Color: Amber Bilirubin: Neg mg/dL WBC/hpf: 0 - 5/hpf  Appearance: Cloudy Ketones: Neg mg/dL RBC/hpf: >60/hpf  Specific Gravity: 1.025 Blood: 3+ ery/uL Bacteria: Rare (0-9/hpf)  pH: 6.5 Protein: 3+ mg/dL Cystals: NS (Not Seen)  Glucose: Neg mg/dL Urobilinogen: 0.2 mg/dL Casts: NS (Not Seen)    Nitrites: Neg Trichomonas: Not Present    Leukocyte Esterase: Trace leu/uL Mucous: Not Present      Epithelial Cells: 10 - 20/hpf      Yeast: NS (Not Seen)      Sperm: Not Present     Notes: transitional and renal tubular    ASSESSMENT:      ICD-10 Details  1 GU:   Bladder tumor/neoplasm - D41.4   2   Gross hematuria - R31.0    PLAN:           Orders Labs Urine Culture          Document Letter(s):  Created for Patient: Clinical Summary         Notes:   Proceed with transurethral resection bladder tumor. He understands potential risks including but not limited to bleeding, infection, injury to surrounding structures, need for additional procedures, potential for bladder perforation. Do not plan to instill postoperative chemotherapy due to the extent of disease and the possibility of invasive disease, and a large amount of resection that is going to be required.   Cc: [name], M.D.    Signed by Link Snuffer, III, M.D. on 02/01/19 at 12:05 PM (EDT

## 2019-02-06 NOTE — Anesthesia Procedure Notes (Signed)
Procedure Name: Intubation Date/Time: 02/06/2019 8:37 AM Performed by: Sharlette Dense, CRNA Patient Re-evaluated:Patient Re-evaluated prior to induction Oxygen Delivery Method: Circle system utilized Preoxygenation: Pre-oxygenation with 100% oxygen Induction Type: IV induction and Rapid sequence Laryngoscope Size: Miller and 3 Grade View: Grade I Tube type: Oral Tube size: 8.0 mm Number of attempts: 1 Airway Equipment and Method: Stylet Placement Confirmation: positive ETCO2,  ETT inserted through vocal cords under direct vision and breath sounds checked- equal and bilateral Secured at: 22 cm Tube secured with: Tape Dental Injury: Teeth and Oropharynx as per pre-operative assessment

## 2019-02-06 NOTE — Interval H&P Note (Signed)
History and Physical Interval Note:  02/06/2019 8:07 AM  Jeremy Day  has presented today for surgery, with the diagnosis of BLADDER TUMOR.  The various methods of treatment have been discussed with the patient and family. After consideration of risks, benefits and other options for treatment, the patient has consented to  Procedure(s): TRANSURETHRAL RESECTION OF BLADDER TUMOR (TURBT) (N/A) as a surgical intervention.  The patient's history has been reviewed, patient examined, no change in status, stable for surgery.  I have reviewed the patient's chart and labs.  Questions were answered to the patient's satisfaction.     Marton Redwood, III

## 2019-02-06 NOTE — Anesthesia Postprocedure Evaluation (Signed)
Anesthesia Post Note  Patient: Jeremy Day  Procedure(s) Performed: TRANSURETHRAL RESECTION OF BLADDER TUMOR (TURBT) (N/A Bladder)     Patient location during evaluation: PACU Anesthesia Type: General Level of consciousness: awake and alert Pain management: pain level controlled Vital Signs Assessment: post-procedure vital signs reviewed and stable Respiratory status: spontaneous breathing, nonlabored ventilation and respiratory function stable Cardiovascular status: blood pressure returned to baseline and stable Postop Assessment: no apparent nausea or vomiting Anesthetic complications: no    Last Vitals:  Vitals:   02/06/19 1200 02/06/19 1223  BP: (!) 141/103 (!) 150/82  Pulse: 68 64  Resp: 18 16  Temp:  36.6 C  SpO2: 100% 100%    Last Pain:  Vitals:   02/06/19 1223  TempSrc: Oral  PainSc:                  Lynda Rainwater

## 2019-02-06 NOTE — Discharge Instructions (Signed)

## 2019-02-07 ENCOUNTER — Encounter (HOSPITAL_COMMUNITY): Payer: Self-pay | Admitting: Urology

## 2019-02-07 DIAGNOSIS — C678 Malignant neoplasm of overlapping sites of bladder: Secondary | ICD-10-CM | POA: Diagnosis not present

## 2019-02-07 LAB — BASIC METABOLIC PANEL
Anion gap: 5 (ref 5–15)
BUN: 15 mg/dL (ref 8–23)
CO2: 23 mmol/L (ref 22–32)
Calcium: 8 mg/dL — ABNORMAL LOW (ref 8.9–10.3)
Chloride: 109 mmol/L (ref 98–111)
Creatinine, Ser: 0.85 mg/dL (ref 0.61–1.24)
GFR calc Af Amer: >60 mL/min (ref >60)
GFR calc non Af Amer: >60 mL/min (ref >60)
Glucose, Bld: 126 mg/dL — ABNORMAL HIGH (ref 70–99)
Potassium: 4.2 mmol/L (ref 3.5–5.1)
Sodium: 137 mmol/L (ref 135–145)

## 2019-02-07 LAB — HEMOGLOBIN AND HEMATOCRIT, BLOOD
HCT: 43.8 % (ref 39.0–52.0)
Hemoglobin: 14.1 g/dL (ref 13.0–17.0)

## 2019-02-07 LAB — HIV ANTIBODY (ROUTINE TESTING W REFLEX): HIV Screen 4th Generation wRfx: NONREACTIVE

## 2019-02-07 NOTE — Discharge Summary (Signed)
Physician Discharge Summary  Patient ID: Jeremy Day MRN: [id number] DOB/AGE: [birth year] 70 y.o.  Admit date: 02/06/2019 Discharge date: 02/07/2019  Admission Diagnoses:  Discharge Diagnoses:  Active Problems:   Bladder tumor   Discharged Condition: good  Hospital Course: Patient underwent a TURBT on 02/07/2019.  Due to continued gross hematuria, he remained overnight for observation on continuous bladder irrigation.  This was able to be weaned off the following day.  He was therefore discharged home in stable condition with his catheter.  Consults: None  Significant Diagnostic Studies: labs: Hemoglobin stable, creatinine stable  Treatments: surgery: TURBT  Discharge Exam: Blood pressure 136/87, pulse (!) 55, temperature 98.3 F (36.8 C), temperature source Oral, resp. rate 18, height 6' (1.829 m), weight 91.7 kg, SpO2 95 %. General appearance: alert no acute distress Adequate perfusion of extremities Nonlabored respiration Abdomen soft nontender nondistended Foley catheter with light pink urine off CBI  Disposition: Discharge disposition: 01-Home or Self Care        Allergies as of 02/07/2019   No Known Allergies     Medication List    TAKE these medications   acetaminophen 500 MG tablet Commonly known as:  TYLENOL Take 1,000 mg by mouth every 6 (six) hours as needed (pain.).   calcium carbonate 500 MG chewable tablet Commonly known as:  TUMS - dosed in mg elemental calcium Chew 1 tablet by mouth daily as needed for indigestion or heartburn.   clobetasol cream 0.05 % Commonly known as:  TEMOVATE Apply 1 application topically 2 (two) times daily as needed (psoriasis).   diclofenac 75 MG EC tablet Commonly known as:  VOLTAREN Take 75 mg by mouth daily.   diphenhydrAMINE 25 MG tablet Commonly known as:  BENADRYL Take 25 mg by mouth every 6 (six) hours as needed for allergies.   famotidine 20 MG tablet Commonly known as:  PEPCID Take 20 mg by mouth every  morning.   HYDROcodone-acetaminophen 5-325 MG tablet Commonly known as:  NORCO/VICODIN Take 1 tablet by mouth every 4 (four) hours as needed for moderate pain.   mometasone 50 MCG/ACT nasal spray Commonly known as:  NASONEX Place 2 sprays into the nose daily as needed (allergies.).   pimecrolimus 1 % cream Commonly known as:  ELIDEL Apply 1 application topically daily as needed (psoriasis).   SYSTANE OP Place 1 drop into both eyes 2 (two) times daily as needed (dry eyes).   tamsulosin 0.4 MG Caps capsule Commonly known as:  FLOMAX Take 0.4 mg by mouth 2 (two) times daily.        Signed: Marton Redwood, III 02/07/2019, 4:21 PM

## 2019-02-11 DIAGNOSIS — N400 Enlarged prostate without lower urinary tract symptoms: Secondary | ICD-10-CM | POA: Diagnosis not present

## 2019-02-11 DIAGNOSIS — I70233 Atherosclerosis of native arteries of right leg with ulceration of ankle: Secondary | ICD-10-CM | POA: Diagnosis not present

## 2019-02-11 DIAGNOSIS — L97519 Non-pressure chronic ulcer of other part of right foot with unspecified severity: Secondary | ICD-10-CM | POA: Diagnosis not present

## 2019-02-11 DIAGNOSIS — I70235 Atherosclerosis of native arteries of right leg with ulceration of other part of foot: Secondary | ICD-10-CM | POA: Diagnosis not present

## 2019-02-11 DIAGNOSIS — C678 Malignant neoplasm of overlapping sites of bladder: Secondary | ICD-10-CM | POA: Diagnosis not present

## 2019-02-11 DIAGNOSIS — L97319 Non-pressure chronic ulcer of right ankle with unspecified severity: Secondary | ICD-10-CM | POA: Diagnosis not present

## 2019-02-11 DIAGNOSIS — R310 Gross hematuria: Secondary | ICD-10-CM | POA: Diagnosis not present

## 2019-02-11 DIAGNOSIS — Z79899 Other long term (current) drug therapy: Secondary | ICD-10-CM | POA: Diagnosis not present

## 2019-02-11 DIAGNOSIS — Z7902 Long term (current) use of antithrombotics/antiplatelets: Secondary | ICD-10-CM | POA: Diagnosis not present

## 2019-02-11 DIAGNOSIS — Z7982 Long term (current) use of aspirin: Secondary | ICD-10-CM | POA: Diagnosis not present

## 2019-02-12 ENCOUNTER — Other Ambulatory Visit: Payer: Self-pay | Admitting: Urology

## 2019-02-13 ENCOUNTER — Other Ambulatory Visit: Payer: Self-pay

## 2019-02-18 NOTE — Progress Notes (Signed)
Anesthesia Chart Review   Case:  [id number] Date/Time:  02/20/19 0815   Procedure:  TRANSURETHRAL RESECTION OF BLADDER TUMOR (TURBT) (N/A )   Anesthesia type:  General   Pre-op diagnosis:  BLADDER CANCER   Location:  [facility] 03 / [facility]   Surgeon:  Lucas Mallow, MD      DISCUSSION:68 yo former smoker (quit 02/04/85) with h/o depression, GERD, PVC's, dilated aortic root (64mm on Echo 2017, stable with recheck in 2-3 years recommended), BPH, bladder tumor scheduled for above procedure 02/20/19 with Dr. Link Snuffer.   Anesthesia records reviewed.  S/p TURBT 02/06/19 with no anesthesia complications noted. Pt can proceed with planned procedure barring acute status change and DOS evaluation (same day workup).  VS: There were no vitals taken for this visit.  PROVIDERS: [name], MD is PCP   Candee Furbish, MD is Cardiologist  LABS: Labs DOS (all labs ordered are listed, but only abnormal results are displayed)  Labs Reviewed - No data to display   IMAGES:   EKG: 02/05/2019 Rate 59 bpm Sinus bradycardia Cannot rule out inferior infarct, age undetermined Abnormal ECG   CV: Echo 04/11/2016 Study Conclusions  - Left ventricle: The cavity size was normal. There was mild focal basal hypertrophy of the septum. The estimated ejection fraction was 55%. Wall motion was normal; there were no regional wall motion abnormalities. Doppler parameters are consistent with abnormal left ventricular relaxation (grade 1 diastolic dysfunction). - Aortic valve: Trileaflet. There was no stenosis. - Aorta: Mildly dilated aortic root and ascending aorta. Aortic root dimension: 43 mm (ED). Ascending aortic diameter: 41 mm (S). - Mitral valve: There was no significant regurgitation. - Right ventricle: The cavity size was normal. Systolic function was normal. - Tricuspid valve: Peak RV-RA gradient (S): 20 mm Hg. - Pulmonary arteries: PA peak pressure: 23 mm Hg (S). - Inferior vena  cava: The vessel was normal in size. The respirophasic diameter changes were in the normal range (>= 50%), consistent with normal central venous pressure.  Impressions:  - Normal LV size with mild focal basal septal hypertrophy. EF 55%. Normal RV size and systolic function. Mildly dilated aortic root and ascending aortra. Trileaflet aortic valve without regurgitation. Past Medical History:  Diagnosis Date  . Ascending aorta dilatation (HCC)    echo  04-11-2016  22mm and dilated aortc root 76mm  . Bladder tumor   . BPH (benign prostatic hyperplasia)   . Chronic rhinitis   . Depression   . Diverticulosis   . Full dentures   . GERD (gastroesophageal reflux disease)   . History of colon polyps   . History of staphylococcal infection 2008   right ankle  w/ sepsis  . OA (osteoarthritis)   . Psoriasis   . PVC's (premature ventricular contractions) 2012   hx bigeminy/ trigeminy with near syncope (cardiology consult note in epic dated 2012)  . Seasonal allergies   . Urgency of urination     Past Surgical History:  Procedure Laterality Date  . ANKLE ARTHROSCOPY Right 02-13-2007    dr [name]   w/ debridement and removal loose body  . COLONOSCOPY  last one summer 2019  . INGUINAL HERNIA REPAIR Right 1980s  . INGUINAL LYMPH NODE BIOPSY  2001  approx.   benign  . TRANSURETHRAL RESECTION OF BLADDER TUMOR N/A 02/06/2019   Procedure: TRANSURETHRAL RESECTION OF BLADDER TUMOR (TURBT);  Surgeon: Lucas Mallow, MD;  Location: [facility];  Service: Urology;  Laterality: N/A;  MEDICATIONS: No current facility-administered medications for this encounter.    Marland Kitchen acetaminophen (TYLENOL) 500 MG tablet  . calcium carbonate (TUMS - DOSED IN MG ELEMENTAL CALCIUM) 500 MG chewable tablet  . clobetasol cream (TEMOVATE) 0.05 %  . diclofenac (VOLTAREN) 75 MG EC tablet  . diphenhydrAMINE (BENADRYL) 25 MG tablet  . famotidine (PEPCID) 20 MG tablet  . HYDROcodone-acetaminophen  (NORCO/VICODIN) 5-325 MG tablet  . mometasone (NASONEX) 50 MCG/ACT nasal spray  . pimecrolimus (ELIDEL) 1 % cream  . Polyethyl Glycol-Propyl Glycol (SYSTANE OP)  . tamsulosin (FLOMAX) 0.4 MG CAPS capsule    Maia Plan [facility] Pre-Surgical Testing [phone] 02/18/19 3:37 PM

## 2019-02-18 NOTE — Anesthesia Preprocedure Evaluation (Addendum)
Anesthesia Evaluation  Patient identified by MRN, date of birth, ID band Patient awake    Reviewed: Allergy & Precautions, NPO status , Patient's Chart, lab work & pertinent test results  Airway Mallampati: II  TM Distance: >3 FB Neck ROM: Full    Dental  (+) Dental Advisory Given, Edentulous Upper, Edentulous Lower   Pulmonary former smoker,    Pulmonary exam normal breath sounds clear to auscultation       Cardiovascular negative cardio ROS Normal cardiovascular exam Rhythm:Regular Rate:Normal     Neuro/Psych PSYCHIATRIC DISORDERS Depression negative neurological ROS     GI/Hepatic Neg liver ROS, GERD  ,  Endo/Other  negative endocrine ROS  Renal/GU negative Renal ROS     Musculoskeletal  (+) Arthritis ,   Abdominal   Peds  Hematology negative hematology ROS (+)   Anesthesia Other Findings   Reproductive/Obstetrics negative OB ROS                            Anesthesia Physical Anesthesia Plan  ASA: III  Anesthesia Plan: General   Post-op Pain Management:    Induction: Intravenous and Rapid sequence  PONV Risk Score and Plan: 4 or greater and Ondansetron, Dexamethasone and Treatment may vary due to age or medical condition  Airway Management Planned: Oral ETT  Additional Equipment: None  Intra-op Plan:   Post-operative Plan: Extubation in OR  Informed Consent: I have reviewed the patients History and Physical, chart, labs and discussed the procedure including the risks, benefits and alternatives for the proposed anesthesia with the patient or authorized representative who has indicated his/her understanding and acceptance.     Dental advisory given  Plan Discussed with: CRNA  Anesthesia Plan Comments: (See PAT note 02/18/19, Konrad Felix, PA-C)       Anesthesia Quick Evaluation

## 2019-02-20 ENCOUNTER — Encounter (HOSPITAL_COMMUNITY)
Admission: RE | Disposition: A | Discharge status: Home / Self Care | Payer: Self-pay | Source: Home / Self Care | Attending: Urology

## 2019-02-20 ENCOUNTER — Encounter (HOSPITAL_COMMUNITY): Payer: Self-pay

## 2019-02-20 ENCOUNTER — Ambulatory Visit (HOSPITAL_COMMUNITY): Payer: PPO | Admitting: Physician Assistant

## 2019-02-20 ENCOUNTER — Ambulatory Visit (HOSPITAL_COMMUNITY)
Admission: RE | Admit: 2019-02-20 | Discharge: 2019-02-20 | Disposition: A | Discharge status: Home / Self Care | Payer: PPO | Attending: Urology | Admitting: Urology

## 2019-02-20 ENCOUNTER — Other Ambulatory Visit: Payer: Self-pay

## 2019-02-20 DIAGNOSIS — N401 Enlarged prostate with lower urinary tract symptoms: Secondary | ICD-10-CM | POA: Insufficient documentation

## 2019-02-20 DIAGNOSIS — F329 Major depressive disorder, single episode, unspecified: Secondary | ICD-10-CM | POA: Insufficient documentation

## 2019-02-20 DIAGNOSIS — C673 Malignant neoplasm of anterior wall of bladder: Secondary | ICD-10-CM | POA: Insufficient documentation

## 2019-02-20 DIAGNOSIS — C679 Malignant neoplasm of bladder, unspecified: Secondary | ICD-10-CM | POA: Diagnosis not present

## 2019-02-20 DIAGNOSIS — R39198 Other difficulties with micturition: Secondary | ICD-10-CM | POA: Diagnosis not present

## 2019-02-20 DIAGNOSIS — Z87891 Personal history of nicotine dependence: Secondary | ICD-10-CM | POA: Diagnosis not present

## 2019-02-20 DIAGNOSIS — M1990 Unspecified osteoarthritis, unspecified site: Secondary | ICD-10-CM | POA: Diagnosis not present

## 2019-02-20 DIAGNOSIS — R351 Nocturia: Secondary | ICD-10-CM | POA: Diagnosis not present

## 2019-02-20 DIAGNOSIS — C671 Malignant neoplasm of dome of bladder: Secondary | ICD-10-CM | POA: Insufficient documentation

## 2019-02-20 DIAGNOSIS — C672 Malignant neoplasm of lateral wall of bladder: Secondary | ICD-10-CM | POA: Insufficient documentation

## 2019-02-20 DIAGNOSIS — R3912 Poor urinary stream: Secondary | ICD-10-CM | POA: Insufficient documentation

## 2019-02-20 DIAGNOSIS — K219 Gastro-esophageal reflux disease without esophagitis: Secondary | ICD-10-CM | POA: Insufficient documentation

## 2019-02-20 DIAGNOSIS — Z791 Long term (current) use of non-steroidal anti-inflammatories (NSAID): Secondary | ICD-10-CM | POA: Insufficient documentation

## 2019-02-20 DIAGNOSIS — C678 Malignant neoplasm of overlapping sites of bladder: Secondary | ICD-10-CM | POA: Diagnosis not present

## 2019-02-20 HISTORY — PX: TRANSURETHRAL RESECTION OF BLADDER TUMOR: SHX2575

## 2019-02-20 LAB — CBC
HCT: 48.9 % (ref 39.0–52.0)
Hemoglobin: 16.1 g/dL (ref 13.0–17.0)
MCH: 33.2 pg (ref 26.0–34.0)
MCHC: 32.9 g/dL (ref 30.0–36.0)
MCV: 100.8 fL — ABNORMAL HIGH (ref 80.0–100.0)
Platelets: 202 10*3/uL (ref 150–400)
RBC: 4.85 MIL/uL (ref 4.22–5.81)
RDW: 13 % (ref 11.5–15.5)
WBC: 7.8 10*3/uL (ref 4.0–10.5)
nRBC: 0 % (ref 0.0–0.2)

## 2019-02-20 LAB — BASIC METABOLIC PANEL
Anion gap: 8 (ref 5–15)
BUN: 19 mg/dL (ref 8–23)
CO2: 27 mmol/L (ref 22–32)
Calcium: 9.1 mg/dL (ref 8.9–10.3)
Chloride: 104 mmol/L (ref 98–111)
Creatinine, Ser: 0.95 mg/dL (ref 0.61–1.24)
GFR calc Af Amer: >60 mL/min (ref >60)
GFR calc non Af Amer: >60 mL/min (ref >60)
Glucose, Bld: 99 mg/dL (ref 70–99)
Potassium: 3.6 mmol/L (ref 3.5–5.1)
Sodium: 139 mmol/L (ref 135–145)

## 2019-02-20 SURGERY — TURBT (TRANSURETHRAL RESECTION OF BLADDER TUMOR)
Anesthesia: General

## 2019-02-20 MED ORDER — SUGAMMADEX SODIUM 200 MG/2ML IV SOLN
INTRAVENOUS | Status: DC | PRN
  Administered 2019-02-20: 200 mg via INTRAVENOUS

## 2019-02-20 MED ORDER — EPHEDRINE SULFATE-NACL 50-0.9 MG/10ML-% IV SOSY
PREFILLED_SYRINGE | INTRAVENOUS | Status: DC | PRN
  Administered 2019-02-20: 5 mg via INTRAVENOUS

## 2019-02-20 MED ORDER — PROPOFOL 10 MG/ML IV BOLUS
INTRAVENOUS | Status: AC
  Filled 2019-02-20: qty 20

## 2019-02-20 MED ORDER — STERILE WATER FOR IRRIGATION IR SOLN
Status: DC | PRN
  Administered 2019-02-20: 250 mL

## 2019-02-20 MED ORDER — SUCCINYLCHOLINE CHLORIDE 200 MG/10ML IV SOSY
PREFILLED_SYRINGE | INTRAVENOUS | Status: DC | PRN
  Administered 2019-02-20: 100 mg via INTRAVENOUS

## 2019-02-20 MED ORDER — PROPOFOL 10 MG/ML IV BOLUS
INTRAVENOUS | Status: DC | PRN
  Administered 2019-02-20: 140 mg via INTRAVENOUS

## 2019-02-20 MED ORDER — SODIUM CHLORIDE 0.9 % IR SOLN
Status: DC | PRN
  Administered 2019-02-20 (×14): 3000 mL via INTRAVESICAL

## 2019-02-20 MED ORDER — FENTANYL CITRATE (PF) 100 MCG/2ML IJ SOLN
INTRAMUSCULAR | Status: AC
  Filled 2019-02-20: qty 2

## 2019-02-20 MED ORDER — LIDOCAINE 2% (20 MG/ML) 5 ML SYRINGE
INTRAMUSCULAR | Status: DC | PRN
  Administered 2019-02-20: 40 mg via INTRAVENOUS

## 2019-02-20 MED ORDER — EPHEDRINE 5 MG/ML INJ
INTRAVENOUS | Status: AC
  Filled 2019-02-20: qty 10

## 2019-02-20 MED ORDER — CEFAZOLIN SODIUM-DEXTROSE 2-4 GM/100ML-% IV SOLN
2.0000 g | INTRAVENOUS | Status: AC
  Administered 2019-02-20: 2 g via INTRAVENOUS
  Filled 2019-02-20: qty 100

## 2019-02-20 MED ORDER — HYDROMORPHONE HCL 1 MG/ML IJ SOLN: 0.2500 mg | INTRAMUSCULAR | Status: DC | PRN

## 2019-02-20 MED ORDER — HYDROCODONE-ACETAMINOPHEN 5-325 MG PO TABS: 1.0000 | ORAL_TABLET | ORAL | 0 refills | Status: DC | PRN

## 2019-02-20 MED ORDER — DEXAMETHASONE SODIUM PHOSPHATE 10 MG/ML IJ SOLN
INTRAMUSCULAR | Status: DC | PRN
  Administered 2019-02-20: 8 mg via INTRAVENOUS

## 2019-02-20 MED ORDER — DEXAMETHASONE SODIUM PHOSPHATE 10 MG/ML IJ SOLN
INTRAMUSCULAR | Status: AC
  Filled 2019-02-20: qty 1

## 2019-02-20 MED ORDER — ROCURONIUM BROMIDE 10 MG/ML (PF) SYRINGE
PREFILLED_SYRINGE | INTRAVENOUS | Status: DC | PRN
  Administered 2019-02-20: 40 mg via INTRAVENOUS
  Administered 2019-02-20 (×2): 10 mg via INTRAVENOUS
  Administered 2019-02-20: 20 mg via INTRAVENOUS

## 2019-02-20 MED ORDER — LIDOCAINE 2% (20 MG/ML) 5 ML SYRINGE
INTRAMUSCULAR | Status: AC
  Filled 2019-02-20: qty 5

## 2019-02-20 MED ORDER — ONDANSETRON HCL 4 MG/2ML IJ SOLN
INTRAMUSCULAR | Status: AC
  Filled 2019-02-20: qty 2

## 2019-02-20 MED ORDER — LACTATED RINGERS IV SOLN
INTRAVENOUS | Status: DC
  Administered 2019-02-20: 07:00:00 via INTRAVENOUS

## 2019-02-20 MED ORDER — ONDANSETRON HCL 4 MG/2ML IJ SOLN
INTRAMUSCULAR | Status: DC | PRN
  Administered 2019-02-20: 4 mg via INTRAVENOUS

## 2019-02-20 MED ORDER — PROMETHAZINE HCL 25 MG/ML IJ SOLN: 6.2500 mg | INTRAMUSCULAR | Status: DC | PRN

## 2019-02-20 MED ORDER — FENTANYL CITRATE (PF) 100 MCG/2ML IJ SOLN
INTRAMUSCULAR | Status: DC | PRN
  Administered 2019-02-20 (×5): 50 ug via INTRAVENOUS

## 2019-02-20 MED ORDER — MEPERIDINE HCL 50 MG/ML IJ SOLN: 6.2500 mg | INTRAMUSCULAR | Status: DC | PRN

## 2019-02-20 MED ORDER — 0.9 % SODIUM CHLORIDE (POUR BTL) OPTIME
TOPICAL | Status: DC | PRN
  Administered 2019-02-20: 1000 mL

## 2019-02-20 MED ORDER — SUGAMMADEX SODIUM 200 MG/2ML IV SOLN
INTRAVENOUS | Status: AC
  Filled 2019-02-20: qty 2

## 2019-02-20 SURGICAL SUPPLY — 14 items
BAG URINE DRAINAGE (UROLOGICAL SUPPLIES) IMPLANT
BAG URO CATCHER STRL LF (MISCELLANEOUS) ×2 IMPLANT
COVER WAND RF STERILE (DRAPES) IMPLANT
ELECT REM PT RETURN 15FT ADLT (MISCELLANEOUS) ×2 IMPLANT
GLOVE BIO SURGEON STRL SZ7.5 (GLOVE) ×2 IMPLANT
GOWN STRL REUS W/TWL LRG LVL3 (GOWN DISPOSABLE) ×2 IMPLANT
KIT TURNOVER KIT A (KITS) IMPLANT
LOOP CUT BIPOLAR 24F LRG (ELECTROSURGICAL) ×2 IMPLANT
MANIFOLD NEPTUNE II (INSTRUMENTS) ×2 IMPLANT
PACK CYSTO (CUSTOM PROCEDURE TRAY) ×2 IMPLANT
SET ASPIRATION TUBING (TUBING) IMPLANT
SYRINGE IRR TOOMEY STRL 70CC (SYRINGE) ×1 IMPLANT
TUBING CONNECTING 10 (TUBING) ×2 IMPLANT
TUBING UROLOGY SET (TUBING) ×2 IMPLANT

## 2019-02-20 NOTE — Op Note (Signed)
Operative Note  Preoperative diagnosis:  1.  Bladder cancer  Postoperative diagnosis: 1.  Bladder cancer  Procedure(s): 1.  Urethral dilation 2.  Transurethral resection of bladder tumor--large, greater than 5 cm  Surgeon: Link Snuffer, MD  Assistants: None  Anesthesia: General  Complications: None immediate  EBL: Minimal  Specimens: 1.  Bladder tumor  Drains/Catheters: 1.  77 French two-way Foley catheter  Intraoperative findings: 1.  Normal urethra 2.  I did not visualize bilateral ureteral orifices however resection seem to be away from that area. 3.  Extensive amount of tumor within the bladder today mainly resected on the right and left lateral walls, anterior bladder wall, and the dome of the bladder.  Some resection was performed on the posterior bladder wall as well.  Tumor was unable to be completely resected.  There were multiple tumors throughout the bladder ranging in size from 1 cm to greater than 5 cm.  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  It was unable to be completely resected.  I discussed cystectomy versus repeat TURBT taking into consideration his high-volume disease.  He greatly desired repeat TURBT.  Description of procedure:  The patient was identified and consent was obtained.  The patient was taken to the operating room and placed in the supine position.  The patient was placed under general anesthesia.  Perioperative antibiotics were administered.  The patient was placed in dorsal lithotomy.  Patient was prepped and draped in a standard sterile fashion and a timeout was performed.  The urethral meatus was first dilated sequentially from 24 [place] up to 30 [place].  A 26 French resectoscope with a visual obturator in place was advanced into the urethra and into the bladder.  This was exchanged for the working element.  On bipolar settings I proceeded to  resect a very large volume of tumor.  Despite extensive resection totaling about 2 hours, I was unable to completely resect all tumor.  I collected all bladder tumor specimens and passed it off for permanent pathology.  I inspected the bladder and there was good hemostasis.  Due to the extensive resection, I decided to leave a urethral catheter.  This concluded the operation.  Patient tolerated the procedure well and was stable postoperatively.  Plan: Return in 1 week for pathology review.  I will discuss immediate cystectomy versus repeat TURBT.  If there is evidence of muscle involvement, we will discuss neoadjuvant chemotherapy.

## 2019-02-20 NOTE — Transfer of Care (Signed)
Immediate Anesthesia Transfer of Care Note  Patient: Jeremy Day  Procedure(s) Performed: TRANSURETHRAL RESECTION OF BLADDER TUMOR (TURBT) (N/A )  Patient Location: PACU  Anesthesia Type:General  Level of Consciousness: awake and patient cooperative  Airway & Oxygen Therapy: Patient Spontanous Breathing and Patient connected to face mask  Post-op Assessment: Report given to RN and Post -op Vital signs reviewed and stable  Post vital signs: Reviewed and stable  Last Vitals:  Vitals Value Taken Time  BP 126/85 02/20/2019 10:54 AM  Temp 36.4 C 02/20/2019 10:54 AM  Pulse 62 02/20/2019 10:58 AM  Resp 9 02/20/2019 10:58 AM  SpO2 96 % 02/20/2019 10:58 AM  Vitals shown include unvalidated device data.  Last Pain:  Vitals:   02/20/19 1054  TempSrc:   PainSc: 0-No pain         Complications: No apparent anesthesia complications

## 2019-02-20 NOTE — H&P (Signed)
CC/HPI: CC: Bladder cancer  HPI:  01/24/2019  68 year old male presents with a primary complaint of urgency to urinate with low volume output. He also complains about mildly weak stream, nocturia 3 in difficulty postponing urination. AUA symptom score is 15. He denies a history of urinary tract infections except for once in the 1970s. He denies current hematuria or dysuria. Incidentally, he notes a 2 year history of intermittent gross hematuria. Most recently, the gross hematuria symptoms have increased and he had 3 episodes of gross hematuria last week. He has never had this worked up. He is asymptomatic in this regard. He started Flomax about 2 weeks ago and has not noticed much of a difference.   02/01/2019  Patient underwent a CT IVP. This revealed multiple filling defects in his bladder consistent with likely tumors. Kidney and ureter were normal. PSA at the last visit was normal.   02/11/19: s/p 1st TURBT on 02/06/19. 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 He tolerated well and returns today for pathology review and TOV. Urine has been clear. He has no complaints today. In the operating room, he was found have a large amount of tumor. It was not completely resectable with one surgery.     ALLERGIES: No Allergies    MEDICATIONS: Phenazopyridine Hcl  Tamsulosin Hcl 0.4 mg capsule 1 capsule PO BID  Tamsulosin Hcl 0.4 mg capsule  Calcium Carbonate  Clobetasol Propionate  Diclofenac Sodium  Diphenhydramine Hcl 25 mg capsule  Famotidine 20 mg tablet  Mometasone Furoate  Pimecrolimus 1 % cream     GU PSH: Cystoscopy - 02/01/2019 Cystoscopy TURBT >5 cm - 02/06/2019 Locm 300-399Mg /Ml Iodine,1Ml - 01/29/2019    NON-GU PSH: No Non-GU PSH    GU PMH: Bladder tumor/neoplasm - 02/01/2019 BPH w/LUTS - 01/24/2019 Encounter for Prostate Cancer screening - 01/24/2019 Gross hematuria - 01/24/2019 Nocturia - 01/24/2019 Urinary Urgency - 01/24/2019 Weak Urinary Stream - 01/24/2019    NON-GU PMH:  No Non-GU PMH    FAMILY HISTORY: No Family History    SOCIAL HISTORY: No Social History    REVIEW OF SYSTEMS:    GU Review Male:   Patient denies stream starts and stops, hard to postpone urination, erection problems, frequent urination, leakage of urine, burning/ pain with urination, trouble starting your stream, have to strain to urinate , penile pain, and get up at night to urinate.  Gastrointestinal (Upper):   Patient denies nausea, vomiting, and indigestion/ heartburn.  Gastrointestinal (Lower):   Patient denies diarrhea and constipation.  Constitutional:   Patient denies fever, night sweats, weight loss, and fatigue.  Skin:   Patient denies skin rash/ lesion and itching.  Eyes:   Patient denies blurred vision and double vision.  Ears/ Nose/ Throat:   Patient denies sore throat and sinus problems.  Hematologic/Lymphatic:   Patient denies swollen glands and easy bruising.  Cardiovascular:   Patient denies leg swelling and chest pains.  Respiratory:   Patient denies cough and shortness of breath.  Endocrine:   Patient denies excessive thirst.  Musculoskeletal:   Patient denies back pain and joint pain.  Neurological:   Patient denies headaches and dizziness.  Psychologic:   Patient denies depression and anxiety.   VITAL SIGNS:      02/11/2019 08:13 AM  BP 126/83 mmHg  Heart Rate 75 /min  Temperature 97.6 F / 36.4 C   GU PHYSICAL EXAMINATION:    Penis: Foley catheter draining clear yellow urine   MULTI-SYSTEM PHYSICAL EXAMINATION:    Constitutional:  Well-nourished. No physical deformities. Normally developed. Good grooming.  Respiratory: No labored breathing, no use of accessory muscles.   Cardiovascular: Normal temperature, adequate perfusion of extremities  Skin: No paleness, no jaundice  Gastrointestinal: No mass, no tenderness, no rigidity, non obese abdomen.  Eyes: Normal conjunctivae. Normal eyelids.  Musculoskeletal: Normal gait and station of head and neck.     PAST  DATA REVIEWED:  Source Of History:  Patient  Lab Test Review:   Path Report   01/24/19  PSA  Total PSA 2.93 ng/mL    PROCEDURES:         Voiding Trial - 51700  Voided Volume: 200 cc  Instilled Volume: 140 cc         Urinalysis w/Scope Dipstick Dipstick Cont'd Micro  Color: Amber Bilirubin: Neg mg/dL WBC/hpf: 10 - 20/hpf  Appearance: Cloudy Ketones: Neg mg/dL RBC/hpf: >60/hpf  Specific Gravity: 1.020 Blood: 3+ ery/uL Bacteria: Few (10-25/hpf)  pH: 6.5 Protein: 2+ mg/dL Cystals: NS (Not Seen)  Glucose: Neg mg/dL Urobilinogen: 0.2 mg/dL Casts: NS (Not Seen)    Nitrites: Neg Trichomonas: Not Present    Leukocyte Esterase: 1+ leu/uL Mucous: Present      Epithelial Cells: 0 - 5/hpf      Yeast: NS (Not Seen)      Sperm: Not Present    Notes: renal tubular cells obs.    ASSESSMENT:      ICD-10 Details  1 GU:   Bladder Cancer overlapping sites - C67.8 HGT1 bladder cancer Catheter removed today We discussed proceeding with repeat TURBT for confirmatory staging and residual tumor resection.    PLAN:           Orders Labs Urine Culture          Document Letter(s):  Created for Patient: Clinical Summary         Notes:   I discussed different options including repeat TURBT versus cystectomy. He greatly desires for me to try to get all the tumor endoscopically and avoid cystectomy which is certainly reasonable. This is reasonable considering he had no muscle involvement. We'll plan for repeat TURBT. If I'm unable to resect all the tumor, or if there is muscle involvement, I would recommend proceeding with neoadjuvant chemotherapy plus cystectomy.   Cc: [name], M.D.   Signed by Link Snuffer, III, M.D. on 02/11/19 at 11:38 AM (EDT

## 2019-02-20 NOTE — Anesthesia Procedure Notes (Signed)
Procedure Name: Intubation Date/Time: 02/20/2019 8:32 AM Performed by: Silas Sacramento, CRNA Pre-anesthesia Checklist: Patient identified, Emergency Drugs available, Suction available and Patient being monitored Patient Re-evaluated:Patient Re-evaluated prior to induction Oxygen Delivery Method: Circle system utilized Preoxygenation: Pre-oxygenation with 100% oxygen Induction Type: IV induction Ventilation: Mask ventilation without difficulty Laryngoscope Size: Mac and 3 Grade View: Grade I Tube type: Oral Tube size: 7.5 mm Number of attempts: 1 Airway Equipment and Method: Stylet Placement Confirmation: ETT inserted through vocal cords under direct vision,  positive ETCO2 and breath sounds checked- equal and bilateral Secured at: 23 cm Tube secured with: Tape Dental Injury: Teeth and Oropharynx as per pre-operative assessment

## 2019-02-20 NOTE — Anesthesia Postprocedure Evaluation (Signed)
Anesthesia Post Note  Patient: Jeremy Day  Procedure(s) Performed: TRANSURETHRAL RESECTION OF BLADDER TUMOR (TURBT) (N/A )     Patient location during evaluation: PACU Anesthesia Type: General Level of consciousness: sedated and patient cooperative Pain management: pain level controlled Vital Signs Assessment: post-procedure vital signs reviewed and stable Respiratory status: spontaneous breathing Cardiovascular status: stable Anesthetic complications: no    Last Vitals:  Vitals:   02/20/19 1130 02/20/19 1200  BP: 109/71 109/86  Pulse: 65 69  Resp: 12 16  Temp: (!) 36.4 C 36.4 C  SpO2: 95% 95%    Last Pain:  Vitals:   02/20/19 1200  TempSrc:   PainSc: 0-No pain                 Nolon Nations

## 2019-02-20 NOTE — Discharge Instructions (Signed)

## 2019-02-21 ENCOUNTER — Encounter (HOSPITAL_COMMUNITY): Payer: Self-pay | Admitting: Urology

## 2019-02-26 DIAGNOSIS — C678 Malignant neoplasm of overlapping sites of bladder: Secondary | ICD-10-CM | POA: Diagnosis not present

## 2019-02-26 DIAGNOSIS — R310 Gross hematuria: Secondary | ICD-10-CM | POA: Diagnosis not present

## 2019-02-27 ENCOUNTER — Other Ambulatory Visit: Payer: Self-pay | Admitting: Urology

## 2019-03-06 NOTE — Patient Instructions (Signed)
Jeremy Day  03/06/2019      Your procedure is scheduled on:  03-13-2019   Report to [facility] Main  Entrance, Report to admitting at  7:45 AM    Call this number if you have problems the morning of surgery [phone]       Remember: Do not eat food or drink liquids :After Midnight.  This includes no water, candy, gum, or mints.  BRUSH YOUR TEETH MORNING OF SURGERY AND RINSE YOUR MOUTH OUT.        Take these medicines the morning of surgery with A SIP OF WATER:   Famotidine (pepcid),  Tamsulosin (flomax),  Nasonex nasal spray                                   You may not have any metal on your body including piercings              Do not wear jewelry, lotions, powders or perfumes, deodorant                          Men may shave face and neck.   Do not bring valuables to the hospital. Jeremy Day.  Contacts, dentures or bridgework may not be worn into surgery.      Patients discharged the day of surgery WILL NOT be allowed to drive home.  YOU MUST HAVE AN ADULT TO DRIVE YOU HOME AND BE WITH YOU FOR 24 HOURS AFTER SURGERY DUE TO ANESTHESIA.  YOU MAY NOT GO HOME BY BUS, TAXI, OR UBER UNLESS YOU HAVE AN ADULT, A MUST,  ACCOMPANYING  YOU HOME AND STAY WITH YOU FOR 24 HOURS.   Name and phone number of your driver:                _____________________________________________________________________             [facility] - Preparing for Surgery Before surgery, you can play an important role.  Because skin is not sterile, your skin needs to be as free of germs as possible.  You can reduce the number of germs on your skin by washing with CHG (chlorahexidine gluconate) soap before surgery.  CHG is an antiseptic cleaner which kills germs and bonds with the skin to continue killing germs even after washing. Please DO NOT use if you have an allergy to CHG or antibacterial soaps.  If your skin  becomes reddened/irritated stop using the CHG and inform your nurse when you arrive at Short Stay. Do not shave (including legs and underarms) for at least 48 hours prior to the first CHG shower.  You may shave your face/neck. Please follow these instructions carefully:  1.  Shower with CHG Soap the night before surgery and the  morning of Surgery.  2.  If you choose to wash your hair, wash your hair first as usual with your  normal  shampoo.  3.  After you shampoo, rinse your hair and body thoroughly to remove the  shampoo.                            4.  Use CHG as  you would any other liquid soap.  You can apply chg directly  to the skin and wash                       Gently with a scrungie or clean washcloth.  5.  Apply the CHG Soap to your body ONLY FROM THE NECK DOWN.   Do not use on face/ open                           Wound or open sores. Avoid contact with eyes, ears mouth and genitals (private parts).                       Wash face,  Genitals (private parts) with your normal soap.             6.  Wash thoroughly, paying special attention to the area where your surgery  will be performed.  7.  Thoroughly rinse your body with warm water from the neck down.  8.  DO NOT shower/wash with your normal soap after using and rinsing off  the CHG Soap.             9.  Pat yourself dry with a clean towel.            10.  Wear clean pajamas.            11.  Place clean sheets on your bed the night of your first shower and do not  sleep with pets. Day of Surgery : Do not apply any lotions/deodorants the morning of surgery.  Please wear clean clothes to the hospital/surgery center.  FAILURE TO FOLLOW THESE INSTRUCTIONS MAY RESULT IN THE CANCELLATION OF YOUR SURGERY PATIENT SIGNATURE_________________________________  NURSE SIGNATURE__________________________________  ________________________________________________________________________

## 2019-03-07 NOTE — Progress Notes (Signed)
SPOKE W/  _Patient     SCREENING SYMPTOMS OF COVID 19:   COUGH--no  RUNNY NOSE--- no  SORE THROAT---no  NASAL CONGESTION----no  SNEEZING----no  SHORTNESS OF BREATH---no  DIFFICULTY BREATHING---no  TEMP >100.0 -----no  UNEXPLAINED BODY ACHES------no  CHILLS -------- no  HEADACHES ---------no  LOSS OF SMELL/ TASTE --------no    HAVE YOU OR ANY FAMILY MEMBER TRAVELLED PAST 14 DAYS OUT OF THE   COUNTY---no STATE----no COUNTRY----no  HAVE YOU OR ANY FAMILY MEMBER BEEN EXPOSED TO ANYONE WITH COVID 19? no    

## 2019-03-08 ENCOUNTER — Encounter (HOSPITAL_COMMUNITY): Payer: Self-pay

## 2019-03-08 ENCOUNTER — Other Ambulatory Visit: Payer: Self-pay

## 2019-03-08 ENCOUNTER — Encounter (HOSPITAL_COMMUNITY)
Admission: RE | Admit: 2019-03-08 | Discharge: 2019-03-08 | Disposition: A | Discharge status: Home / Self Care | Payer: PPO | Source: Ambulatory Visit | Attending: Urology | Admitting: Urology

## 2019-03-08 DIAGNOSIS — C679 Malignant neoplasm of bladder, unspecified: Secondary | ICD-10-CM | POA: Diagnosis not present

## 2019-03-08 DIAGNOSIS — Z01812 Encounter for preprocedural laboratory examination: Secondary | ICD-10-CM | POA: Insufficient documentation

## 2019-03-08 HISTORY — DX: Personal history of other diseases of the digestive system: Z87.19

## 2019-03-08 LAB — CBC
HCT: 47.1 % (ref 39.0–52.0)
Hemoglobin: 15.3 g/dL (ref 13.0–17.0)
MCH: 32.3 pg (ref 26.0–34.0)
MCHC: 32.5 g/dL (ref 30.0–36.0)
MCV: 99.4 fL (ref 80.0–100.0)
Platelets: 217 10*3/uL (ref 150–400)
RBC: 4.74 MIL/uL (ref 4.22–5.81)
RDW: 12.8 % (ref 11.5–15.5)
WBC: 8.5 10*3/uL (ref 4.0–10.5)
nRBC: 0 % (ref 0.0–0.2)

## 2019-03-08 LAB — BASIC METABOLIC PANEL
Anion gap: 6 (ref 5–15)
BUN: 15 mg/dL (ref 8–23)
CO2: 28 mmol/L (ref 22–32)
Calcium: 9.2 mg/dL (ref 8.9–10.3)
Chloride: 104 mmol/L (ref 98–111)
Creatinine, Ser: 1.11 mg/dL (ref 0.61–1.24)
GFR calc Af Amer: >60 mL/min (ref >60)
GFR calc non Af Amer: >60 mL/min (ref >60)
Glucose, Bld: 110 mg/dL — ABNORMAL HIGH (ref 70–99)
Potassium: 4.2 mmol/L (ref 3.5–5.1)
Sodium: 138 mmol/L (ref 135–145)

## 2019-03-08 NOTE — Progress Notes (Signed)
EKG done 02/05/19 and is in epic. Echo done 04/11/16 in epic PCP Dr. [name].

## 2019-03-12 NOTE — Anesthesia Preprocedure Evaluation (Addendum)
Anesthesia Evaluation  Patient identified by MRN, date of birth, ID band Patient awake    Reviewed: Allergy & Precautions, NPO status , Patient's Chart, lab work & pertinent test results  Airway Mallampati: II  TM Distance: >3 FB Neck ROM: Full    Dental no notable dental hx. (+) Dental Advisory Given   Pulmonary former smoker,    Pulmonary exam normal breath sounds clear to auscultation       Cardiovascular negative cardio ROS Normal cardiovascular exam Rhythm:Regular Rate:Normal     Neuro/Psych PSYCHIATRIC DISORDERS Depression negative neurological ROS     GI/Hepatic Neg liver ROS, hiatal hernia, GERD  ,  Endo/Other  negative endocrine ROS  Renal/GU negative Renal ROS     Musculoskeletal  (+) Arthritis ,   Abdominal   Peds  Hematology negative hematology ROS (+)   Anesthesia Other Findings   Reproductive/Obstetrics                                                             Anesthesia Evaluation  Patient identified by MRN, date of birth, ID band Patient awake    Reviewed: Allergy & Precautions, NPO status , Patient's Chart, lab work & pertinent test results  Airway Mallampati: II  TM Distance: >3 FB Neck ROM: Full    Dental  (+) Dental Advisory Given, Edentulous Upper, Edentulous Lower   Pulmonary former smoker,    Pulmonary exam normal breath sounds clear to auscultation       Cardiovascular negative cardio ROS Normal cardiovascular exam Rhythm:Regular Rate:Normal     Neuro/Psych PSYCHIATRIC DISORDERS Depression negative neurological ROS     GI/Hepatic Neg liver ROS, GERD  ,  Endo/Other  negative endocrine ROS  Renal/GU negative Renal ROS     Musculoskeletal  (+) Arthritis ,   Abdominal   Peds  Hematology negative hematology ROS (+)   Anesthesia Other Findings   Reproductive/Obstetrics negative OB ROS                             Anesthesia Physical Anesthesia Plan  ASA: III  Anesthesia Plan: General   Post-op Pain Management:    Induction: Intravenous and Rapid sequence  PONV Risk Score and Plan: 4 or greater and Ondansetron, Dexamethasone and Treatment may vary due to age or medical condition  Airway Management Planned: Oral ETT  Additional Equipment: None  Intra-op Plan:   Post-operative Plan: Extubation in OR  Informed Consent: I have reviewed the patients History and Physical, chart, labs and discussed the procedure including the risks, benefits and alternatives for the proposed anesthesia with the patient or authorized representative who has indicated his/her understanding and acceptance.     Dental advisory given  Plan Discussed with: CRNA  Anesthesia Plan Comments: (See PAT note 02/18/19, Konrad Felix, PA-C)       Anesthesia Quick Evaluation  Anesthesia Physical Anesthesia Plan  ASA: III  Anesthesia Plan: General   Post-op Pain Management:    Induction: Intravenous  PONV Risk Score and Plan: 4 or greater and Ondansetron, Dexamethasone and Treatment may vary due to age or medical condition  Airway Management Planned: Oral ETT  Additional Equipment: None  Intra-op Plan:   Post-operative Plan: Extubation in OR  Informed Consent: I have reviewed  the patients History and Physical, chart, labs and discussed the procedure including the risks, benefits and alternatives for the proposed anesthesia with the patient or authorized representative who has indicated his/her understanding and acceptance.     Dental advisory given  Plan Discussed with: CRNA  Anesthesia Plan Comments: (See PAT note 03/08/2019, Konrad Felix, PA-C)       Anesthesia Quick Evaluation

## 2019-03-12 NOTE — Progress Notes (Signed)
Anesthesia Chart Review   Case:  [id number] Date/Time:  03/13/19 4128   Procedure:  TRANSURETHRAL RESECTION OF BLADDER TUMOR (TURBT) (N/A )   Anesthesia type:  General   Pre-op diagnosis:  BLADDER CANCER   Location:  [place] / [facility]   Surgeon:  Lucas Mallow, MD      DISCUSSION:68 yo former smoker (quit 02/04/85) with h/o depression, GERD, PVC's, dilated aortic root (58mm on Echo 2017, stable with recheck in 2-3 years recommended), BPH, bladder tumor scheduled for above procedure 03/13/2019 with Dr. Link Snuffer.   Anesthesia records reviewed.  S/p TURBT 02/20/19 with no anesthesia complications noted.Pt can proceed with planned procedure barring acute status change. VS: BP 119/64   Pulse 65   Temp 36.4 C (Oral)   Resp 16   Ht 6' (1.829 m)   Wt 86.3 kg   SpO2 98%   BMI 25.80 kg/m   PROVIDERS: [name], MD is PCP    LABS: Labs reviewed: Acceptable for surgery. (all labs ordered are listed, but only abnormal results are displayed)  Labs Reviewed  BASIC METABOLIC PANEL - Abnormal; Notable for the following components:      Result Value   Glucose, Bld 110 (*)    All other components within normal limits  CBC     IMAGES:   EKG: 02/05/2019 Rate 59 bpm Sinus bradycardia Abnormal ECG No significant change since last tracing  CV: Echo 04/11/16 Study Conclusions  - Left ventricle: The cavity size was normal. There was mild focal   basal hypertrophy of the septum. The estimated ejection fraction   was 55%. Wall motion was normal; there were no regional wall   motion abnormalities. Doppler parameters are consistent with   abnormal left ventricular relaxation (grade 1 diastolic   dysfunction). - Aortic valve: Trileaflet. There was no stenosis. - Aorta: Mildly dilated aortic root and ascending aorta. Aortic   root dimension: 43 mm (ED). Ascending aortic diameter: 41 mm (S). - Mitral valve: There was no significant regurgitation. - Right ventricle: The  cavity size was normal. Systolic function   was normal. - Tricuspid valve: Peak RV-RA gradient (S): 20 mm Hg. - Pulmonary arteries: PA peak pressure: 23 mm Hg (S). - Inferior vena cava: The vessel was normal in size. The   respirophasic diameter changes were in the normal range (>= 50%),   consistent with normal central venous pressure.  Impressions:  - Normal LV size with mild focal basal septal hypertrophy. EF 55%.   Normal RV size and systolic function. Mildly dilated aortic root   and ascending aortra. Trileaflet aortic valve without   regurgitation. Past Medical History:  Diagnosis Date  . Ascending aorta dilatation (HCC)    echo  04-11-2016  29mm and dilated aortc root 65mm  . Bladder tumor   . BPH (benign prostatic hyperplasia)   . Chronic rhinitis   . Depression    no meds  . Diverticulosis   . Full dentures   . History of colon polyps   . History of hiatal hernia 2019   Dr. Michail Sermon. small hernia noted  . History of staphylococcal infection 2008   right ankle  w/ sepsis  . OA (osteoarthritis)    lower back, rt ankle,bi lat knees and shouldres  . Psoriasis   . PVC's (premature ventricular contractions) 2012   hx bigeminy/ trigeminy with near syncope (cardiology consult note in epic dated 2012)  . Seasonal allergies   . Urgency of urination  Past Surgical History:  Procedure Laterality Date  . ANKLE ARTHROSCOPY Right 02-13-2007    dr [name]   w/ debridement and removal loose body  . COLONOSCOPY  last one summer 2019  . INGUINAL HERNIA REPAIR Right 1980s  . INGUINAL LYMPH NODE BIOPSY  2001  approx.   benign  . TRANSURETHRAL RESECTION OF BLADDER TUMOR N/A 02/06/2019   Procedure: TRANSURETHRAL RESECTION OF BLADDER TUMOR (TURBT);  Surgeon: Lucas Mallow, MD;  Location: [facility];  Service: Urology;  Laterality: N/A;  . TRANSURETHRAL RESECTION OF BLADDER TUMOR N/A 02/20/2019   Procedure: TRANSURETHRAL RESECTION OF BLADDER TUMOR (TURBT);  Surgeon: Lucas Mallow, MD;  Location: [facility];  Service: Urology;  Laterality: N/A;    MEDICATIONS: . acetaminophen (TYLENOL) 500 MG tablet  . calcium carbonate (TUMS - DOSED IN MG ELEMENTAL CALCIUM) 500 MG chewable tablet  . clobetasol cream (TEMOVATE) 0.05 %  . diclofenac (VOLTAREN) 75 MG EC tablet  . diphenhydrAMINE (BENADRYL) 25 MG tablet  . famotidine (PEPCID) 20 MG tablet  . HYDROcodone-acetaminophen (NORCO) 5-325 MG tablet  . HYDROcodone-acetaminophen (NORCO/VICODIN) 5-325 MG tablet  . mometasone (NASONEX) 50 MCG/ACT nasal spray  . Multiple Vitamins-Minerals (MULTIVITAMIN WITH MINERALS) tablet  . pimecrolimus (ELIDEL) 1 % cream  . Polyethyl Glycol-Propyl Glycol (SYSTANE OP)  . tamsulosin (FLOMAX) 0.4 MG CAPS capsule  . vitamin C (ASCORBIC ACID) 500 MG tablet   No current facility-administered medications for this encounter.      Maia Plan [facility] Pre-Surgical Testing [phone] 03/12/19 10:13 AM

## 2019-03-12 NOTE — Progress Notes (Signed)
SPOKE W/ patient via phone.      SCREENING SYMPTOMS OF COVID 19:     COUGH--no   RUNNY NOSE---no    SORE THROAT---no   NASAL CONGESTION----no   SNEEZING---no   SHORTNESS OF BREATH---no   DIFFICULTY BREATHING---no   TEMP >100.0 -----no   UNEXPLAINED BODY ACHES------no   CHILLS --------no    HEADACHES ---------no   LOSS OF SMELL/ TASTE --------no     HAVE YOU OR ANY FAMILY MEMBER TRAVELLED PAST 14 DAYS OUT OF THE    COUNTY--- [place], took dog to vet and pre-op appt. STATE----no COUNTRY----no   HAVE YOU OR ANY FAMILY MEMBER BEEN EXPOSED TO ANYONE WITH COVID 19?    no

## 2019-03-13 ENCOUNTER — Ambulatory Visit (HOSPITAL_COMMUNITY): Payer: PPO | Admitting: Physician Assistant

## 2019-03-13 ENCOUNTER — Encounter (HOSPITAL_COMMUNITY)
Admission: RE | Disposition: A | Discharge status: Home / Self Care | Payer: Self-pay | Source: Home / Self Care | Attending: Urology

## 2019-03-13 ENCOUNTER — Ambulatory Visit (HOSPITAL_COMMUNITY)
Admission: RE | Admit: 2019-03-13 | Discharge: 2019-03-13 | Disposition: A | Discharge status: Home / Self Care | Payer: PPO | Attending: Urology | Admitting: Urology

## 2019-03-13 ENCOUNTER — Other Ambulatory Visit: Payer: Self-pay

## 2019-03-13 ENCOUNTER — Ambulatory Visit (HOSPITAL_COMMUNITY): Payer: PPO | Admitting: Anesthesiology

## 2019-03-13 DIAGNOSIS — Z87891 Personal history of nicotine dependence: Secondary | ICD-10-CM | POA: Diagnosis not present

## 2019-03-13 DIAGNOSIS — M7540 Impingement syndrome of unspecified shoulder: Secondary | ICD-10-CM | POA: Diagnosis not present

## 2019-03-13 DIAGNOSIS — M009 Pyogenic arthritis, unspecified: Secondary | ICD-10-CM | POA: Diagnosis not present

## 2019-03-13 DIAGNOSIS — C679 Malignant neoplasm of bladder, unspecified: Secondary | ICD-10-CM | POA: Diagnosis not present

## 2019-03-13 DIAGNOSIS — F329 Major depressive disorder, single episode, unspecified: Secondary | ICD-10-CM | POA: Diagnosis not present

## 2019-03-13 DIAGNOSIS — M1990 Unspecified osteoarthritis, unspecified site: Secondary | ICD-10-CM | POA: Diagnosis not present

## 2019-03-13 DIAGNOSIS — C678 Malignant neoplasm of overlapping sites of bladder: Secondary | ICD-10-CM | POA: Diagnosis not present

## 2019-03-13 DIAGNOSIS — K219 Gastro-esophageal reflux disease without esophagitis: Secondary | ICD-10-CM | POA: Insufficient documentation

## 2019-03-13 DIAGNOSIS — L409 Psoriasis, unspecified: Secondary | ICD-10-CM | POA: Insufficient documentation

## 2019-03-13 HISTORY — PX: TRANSURETHRAL RESECTION OF BLADDER TUMOR: SHX2575

## 2019-03-13 SURGERY — TURBT (TRANSURETHRAL RESECTION OF BLADDER TUMOR)
Anesthesia: General

## 2019-03-13 MED ORDER — FENTANYL CITRATE (PF) 250 MCG/5ML IJ SOLN
INTRAMUSCULAR | Status: DC | PRN
  Administered 2019-03-13: 100 ug via INTRAVENOUS
  Administered 2019-03-13 (×2): 50 ug via INTRAVENOUS

## 2019-03-13 MED ORDER — MIDAZOLAM HCL 2 MG/2ML IJ SOLN
INTRAMUSCULAR | Status: AC
  Filled 2019-03-13: qty 2

## 2019-03-13 MED ORDER — ROCURONIUM BROMIDE 10 MG/ML (PF) SYRINGE
PREFILLED_SYRINGE | INTRAVENOUS | Status: AC
  Filled 2019-03-13: qty 10

## 2019-03-13 MED ORDER — SUGAMMADEX SODIUM 200 MG/2ML IV SOLN
INTRAVENOUS | Status: DC | PRN
  Administered 2019-03-13: 200 mg via INTRAVENOUS

## 2019-03-13 MED ORDER — HYDROMORPHONE HCL 1 MG/ML IJ SOLN: 0.2500 mg | INTRAMUSCULAR | Status: DC | PRN

## 2019-03-13 MED ORDER — LIDOCAINE 2% (20 MG/ML) 5 ML SYRINGE
INTRAMUSCULAR | Status: DC | PRN
  Administered 2019-03-13: 75 mg via INTRAVENOUS

## 2019-03-13 MED ORDER — SODIUM CHLORIDE 0.9 % IR SOLN
Status: DC | PRN
  Administered 2019-03-13: 15000 mL

## 2019-03-13 MED ORDER — CEFAZOLIN SODIUM-DEXTROSE 2-4 GM/100ML-% IV SOLN
2.0000 g | INTRAVENOUS | Status: AC
  Administered 2019-03-13: 2 g via INTRAVENOUS

## 2019-03-13 MED ORDER — PROPOFOL 10 MG/ML IV BOLUS
INTRAVENOUS | Status: DC | PRN
  Administered 2019-03-13: 200 mg via INTRAVENOUS

## 2019-03-13 MED ORDER — SUCCINYLCHOLINE CHLORIDE 200 MG/10ML IV SOSY
PREFILLED_SYRINGE | INTRAVENOUS | Status: AC
  Filled 2019-03-13: qty 10

## 2019-03-13 MED ORDER — STERILE WATER FOR IRRIGATION IR SOLN
Status: DC | PRN
  Administered 2019-03-13: 1000 mL

## 2019-03-13 MED ORDER — MIDAZOLAM HCL 5 MG/5ML IJ SOLN
INTRAMUSCULAR | Status: DC | PRN
  Administered 2019-03-13: 2 mg via INTRAVENOUS

## 2019-03-13 MED ORDER — PROMETHAZINE HCL 25 MG/ML IJ SOLN: 6.2500 mg | INTRAMUSCULAR | Status: DC | PRN

## 2019-03-13 MED ORDER — OXYCODONE HCL 5 MG/5ML PO SOLN: 5.0000 mg | Freq: Once | ORAL | Status: DC | PRN

## 2019-03-13 MED ORDER — SUGAMMADEX SODIUM 200 MG/2ML IV SOLN
INTRAVENOUS | Status: AC
  Filled 2019-03-13: qty 2

## 2019-03-13 MED ORDER — CEFAZOLIN SODIUM-DEXTROSE 2-4 GM/100ML-% IV SOLN
INTRAVENOUS | Status: AC
  Filled 2019-03-13: qty 100

## 2019-03-13 MED ORDER — FENTANYL CITRATE (PF) 100 MCG/2ML IJ SOLN
INTRAMUSCULAR | Status: AC
  Filled 2019-03-13: qty 2

## 2019-03-13 MED ORDER — ONDANSETRON HCL 4 MG/2ML IJ SOLN
INTRAMUSCULAR | Status: AC
  Filled 2019-03-13: qty 2

## 2019-03-13 MED ORDER — MEPERIDINE HCL 50 MG/ML IJ SOLN: 6.2500 mg | INTRAMUSCULAR | Status: DC | PRN

## 2019-03-13 MED ORDER — ROCURONIUM BROMIDE 50 MG/5ML IV SOSY
PREFILLED_SYRINGE | INTRAVENOUS | Status: DC | PRN
  Administered 2019-03-13: 30 mg via INTRAVENOUS
  Administered 2019-03-13: 10 mg via INTRAVENOUS

## 2019-03-13 MED ORDER — SUCCINYLCHOLINE CHLORIDE 200 MG/10ML IV SOSY
PREFILLED_SYRINGE | INTRAVENOUS | Status: DC | PRN
  Administered 2019-03-13: 120 mg via INTRAVENOUS

## 2019-03-13 MED ORDER — PROPOFOL 10 MG/ML IV BOLUS
INTRAVENOUS | Status: AC
  Filled 2019-03-13: qty 20

## 2019-03-13 MED ORDER — DEXAMETHASONE SODIUM PHOSPHATE 10 MG/ML IJ SOLN
INTRAMUSCULAR | Status: AC
  Filled 2019-03-13: qty 1

## 2019-03-13 MED ORDER — DEXAMETHASONE SODIUM PHOSPHATE 10 MG/ML IJ SOLN
INTRAMUSCULAR | Status: DC | PRN
  Administered 2019-03-13: 10 mg via INTRAVENOUS

## 2019-03-13 MED ORDER — LIDOCAINE 2% (20 MG/ML) 5 ML SYRINGE
INTRAMUSCULAR | Status: AC
  Filled 2019-03-13: qty 5

## 2019-03-13 MED ORDER — LACTATED RINGERS IV SOLN
INTRAVENOUS | Status: DC
  Administered 2019-03-13: 08:00:00 via INTRAVENOUS

## 2019-03-13 MED ORDER — OXYCODONE HCL 5 MG PO TABS: 5.0000 mg | ORAL_TABLET | Freq: Once | ORAL | Status: DC | PRN

## 2019-03-13 SURGICAL SUPPLY — 14 items
BAG URINE DRAINAGE (UROLOGICAL SUPPLIES) IMPLANT
BAG URO CATCHER STRL LF (MISCELLANEOUS) ×2 IMPLANT
COVER WAND RF STERILE (DRAPES) IMPLANT
ELECT REM PT RETURN 15FT ADLT (MISCELLANEOUS) ×2 IMPLANT
GLOVE BIO SURGEON STRL SZ7.5 (GLOVE) ×2 IMPLANT
GOWN STRL REUS W/TWL LRG LVL3 (GOWN DISPOSABLE) ×2 IMPLANT
KIT TURNOVER KIT A (KITS) IMPLANT
LOOP CUT BIPOLAR 24F LRG (ELECTROSURGICAL) IMPLANT
MANIFOLD NEPTUNE II (INSTRUMENTS) ×2 IMPLANT
PACK CYSTO (CUSTOM PROCEDURE TRAY) ×2 IMPLANT
SET ASPIRATION TUBING (TUBING) IMPLANT
SYRINGE IRR TOOMEY STRL 70CC (SYRINGE) IMPLANT
TUBING CONNECTING 10 (TUBING) ×2 IMPLANT
TUBING UROLOGY SET (TUBING) ×2 IMPLANT

## 2019-03-13 NOTE — Anesthesia Procedure Notes (Signed)
Procedure Name: Intubation Date/Time: 03/13/2019 9:59 AM Performed by: Lollie Sails, CRNA Pre-anesthesia Checklist: Patient identified, Emergency Drugs available, Suction available, Patient being monitored and Timeout performed Patient Re-evaluated:Patient Re-evaluated prior to induction Oxygen Delivery Method: Circle system utilized Preoxygenation: Pre-oxygenation with 100% oxygen Induction Type: IV induction and Rapid sequence Laryngoscope Size: Mac and 4 Grade View: Grade I Tube type: Oral Tube size: 7.5 mm Number of attempts: 1 Airway Equipment and Method: Stylet Placement Confirmation: ETT inserted through vocal cords under direct vision,  positive ETCO2 and breath sounds checked- equal and bilateral Secured at: 23 cm Tube secured with: Tape Dental Injury: Teeth and Oropharynx as per pre-operative assessment

## 2019-03-13 NOTE — H&P (Signed)
CC/HPI: CC: Bladder cancer  HPI:  01/24/2019  68 year old male presents with a primary complaint of urgency to urinate with low volume output. He also complains about mildly weak stream, nocturia 3 in difficulty postponing urination. AUA symptom score is 15. He denies a history of urinary tract infections except for once in the 1970s. He denies current hematuria or dysuria. Incidentally, he notes a 2 year history of intermittent gross hematuria. Most recently, the gross hematuria symptoms have increased and he had 3 episodes of gross hematuria last week. He has never had this worked up. He is asymptomatic in this regard. He started Flomax about 2 weeks ago and has not noticed much of a difference.   02/01/2019  Patient underwent a CT IVP. This revealed multiple filling defects in his bladder consistent with likely tumors. Kidney and ureter were normal. PSA at the last visit was normal.   02/11/19: s/p 1st TURBT on 02/06/19. 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 He tolerated well and returns today for pathology review and TOV. Urine has been clear. He has no complaints today. In the operating room, he was found have a large amount of tumor. It was not completely resectable with one surgery.   02/26/2019  Patient is status post his second TURBT on 02/20/2019. 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 He has been doing well with the catheter. Urine is cleared up. He greatly desires to avoid cystectomy. He does understand that he has a large amount of tumor involvement and after 2 resections, I was not able to remove all the tumor. However, given no muscle involvement. He desires for me to try full resection endoscopically. 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     ALLERGIES: None   MEDICATIONS: Phenazopyridine Hcl  Tamsulosin Hcl 0.4 mg capsule 1 capsule PO BID  Tamsulosin Hcl 0.4 mg capsule  Calcium Carbonate  Clobetasol  Propionate  Diclofenac Sodium  Diphenhydramine Hcl 25 mg capsule  Famotidine 20 mg tablet  Mometasone Furoate  Pimecrolimus 1 % cream     GU PSH: Cystoscopy - 02/01/2019 Cystoscopy TURBT >5 cm - 02/20/2019, 02/06/2019 Locm 300-399Mg /Ml Iodine,1Ml - 01/29/2019    NON-GU PSH: None   GU PMH: Bladder Cancer overlapping sites, HGT1 bladder cancer Catheter removed today We discussed proceeding with repeat TURBT for confirmatory staging and residual tumor resection. - 02/11/2019 Bladder tumor/neoplasm - 02/01/2019 BPH w/LUTS - 01/24/2019 Encounter for Prostate Cancer screening - 01/24/2019 Gross hematuria - 01/24/2019 Nocturia - 01/24/2019 Urinary Urgency - 01/24/2019 Weak Urinary Stream - 01/24/2019    NON-GU PMH: None   FAMILY HISTORY: None   SOCIAL HISTORY: None   REVIEW OF SYSTEMS:    GU Review Male:   Patient denies frequent urination, hard to postpone urination, burning/ pain with urination, get up at night to urinate, leakage of urine, stream starts and stops, trouble starting your stream, have to strain to urinate , erection problems, and penile pain.  Gastrointestinal (Upper):   Patient denies vomiting, nausea, and indigestion/ heartburn.  Gastrointestinal (Lower):   Patient denies diarrhea and constipation.  Constitutional:   Patient denies fever, night sweats, weight loss, and fatigue.  Skin:   Patient denies skin rash/ lesion and itching.  Eyes:   Patient denies blurred vision and double vision.  Ears/ Nose/ Throat:   Patient denies sore throat and sinus problems.  Hematologic/Lymphatic:   Patient denies swollen glands and easy bruising.  Cardiovascular:   Patient denies leg swelling and chest pains.  Respiratory:   Patient denies cough and shortness of breath.  Endocrine:   Patient denies excessive thirst.  Musculoskeletal:   Patient denies back pain and joint pain.  Neurological:   Patient denies headaches and dizziness.  Psychologic:   Patient denies depression and anxiety.    VITAL SIGNS:      02/26/2019 09:14 AM  BP 124/76 mmHg  Heart Rate 65 /min  Temperature 98.0 F / 36.6 C   GU PHYSICAL EXAMINATION:    Penis: Foley catheter draining clear yellow urine   MULTI-SYSTEM PHYSICAL EXAMINATION:    Constitutional: Well-nourished. No physical deformities. Normally developed. Good grooming.  Respiratory: No labored breathing, no use of accessory muscles.   Cardiovascular: Normal temperature, adequate perfusion of extremities  Skin: No paleness, no jaundice  Gastrointestinal: No mass, no tenderness, no rigidity, non obese abdomen.  Eyes: Normal conjunctivae. Normal eyelids.  Musculoskeletal: Normal gait and station of head and neck.     PAST DATA REVIEWED:  Source Of History:  Patient  Lab Test Review:   Path Report   01/24/19  PSA  Total PSA 2.93 ng/mL    PROCEDURES:         Voiding Trial - 51700  Voided Volume: 200 cc  Instilled Volume: 100 cc         Urinalysis w/Scope Dipstick Dipstick Cont'd Micro  Color: Yellow Bilirubin: Neg mg/dL WBC/hpf: 6 - 10/hpf  Appearance: Cloudy Ketones: Neg mg/dL RBC/hpf: 40 - 60/hpf  Specific Gravity: 1.025 Blood: 3+ ery/uL Bacteria: Rare (0-9/hpf)  pH: 7.0 Protein: 3+ mg/dL Cystals: NS (Not Seen)  Glucose: Neg mg/dL Urobilinogen: 0.2 mg/dL Casts: NS (Not Seen)    Nitrites: Neg Trichomonas: Not Present    Leukocyte Esterase: 2+ leu/uL Mucous: Not Present      Epithelial Cells: NS (Not Seen)      Yeast: NS (Not Seen)      Sperm: Not Present    ASSESSMENT:      ICD-10 Details  1 GU:   Bladder Cancer overlapping sites - C67.8    PLAN:           Document Letter(s):  Created for Patient: Clinical Summary         Notes:   I discussed the option of immediate cystectomy versus repeat resection. Again, he highly desires avoiding a cystectomy. Given the absence of invasion of detrusor, I think it is reasonable to repeat resection and tried to get him clear of tumor. He still has large volume of tumor. He wishes  to proceed. He again understands the potential for bleeding, infection, injury to surrounding structures, bladder perforation.   Cc: [name], M.D.   Signed by Link Snuffer, III, M.D. on 02/26/19 at 9:56 AM (EDT

## 2019-03-13 NOTE — Anesthesia Postprocedure Evaluation (Signed)
Anesthesia Post Note  Patient: Jeremy Day  Procedure(s) Performed: TRANSURETHRAL RESECTION OF BLADDER TUMOR (TURBT) (N/A )     Patient location during evaluation: PACU Anesthesia Type: General Level of consciousness: sedated and patient cooperative Pain management: pain level controlled Vital Signs Assessment: post-procedure vital signs reviewed and stable Respiratory status: spontaneous breathing Cardiovascular status: stable Anesthetic complications: no    Last Vitals:  Vitals:   03/13/19 1150 03/13/19 1215  BP: 122/79 130/81  Pulse: 61 (!) 59  Resp: 16 16  Temp: (!) 36.3 C 36.4 C  SpO2: 100% 97%    Last Pain:  Vitals:   03/13/19 1215  TempSrc:   PainSc: 0-No pain                 Nolon Nations

## 2019-03-13 NOTE — Op Note (Addendum)
Operative Note  Preoperative diagnosis:  1.  Bladder cancer  Postoperative diagnosis: 1.  Bladder cancer  Procedure(s): 1.  Transurethral resection of bladder tumor--large  Surgeon: Link Snuffer, MD  Assistants: None  Anesthesia: General  Complications: None immediate  EBL: Minimal  Specimens: 1.  Bladder tumor  Drains/Catheters: 1.  None  Intraoperative findings: 1.  Normal urethra 2.  Multiple bladder tumors mostly on the posterior bladder wall towards the dome, on the anterior wall, and right lateral wall.  It appeared all tumor was resected superficially.  Maximum tumor size was slightly over 5 cm. 3.  Bilateral ureteral orifices were not visualized.  Resection seem to be far away from this area but again I could not identify at the beginning nor at the end of the case.  Indication: 68 year old male status post 2 TURBT that revealed high-grade T1 bladder cancer.  After discussion of cystectomy versus repeat TURBT, he elected for TURBT.  Description of procedure:  The patient was identified and consent was obtained.  The patient was taken to the operating room and placed in the supine position.  The patient was placed under general anesthesia.  Perioperative antibiotics were administered.  The patient was placed in dorsal lithotomy.  Patient was prepped and draped in a standard sterile fashion and a timeout was performed.  A 26 French resectoscope was advanced into the urethra and into the bladder with the visual obturator in place.  This was exchanged for the working component.  On bipolar settings, the remaining bladder tumors of interest were resected.  All tumor appeared resected.  I looked for bilateral ureteral orifices but did not identify them.  Collected the bladder chips for specimen and passed it off for permanent specimen.  I reinspected the bladder and cauterized any active bleeding.  I did not see any active bleeding after cauterization.  All bladder tumor bed was  cauterized as well.  At the conclusion of the case, it appeared all bladder tumor had been resected and there was no active bleeding noted.  I therefore drained the bladder and withdrew the scope.  The patient tolerated the procedure well and was stable postoperatively.  Plan: Return in 1 week for pathology review

## 2019-03-13 NOTE — Discharge Instructions (Addendum)
Transurethral Resection of Bladder Tumor (TURBT) or Bladder Biopsy ° ° °Definition: ° Transurethral Resection of the Bladder Tumor is a surgical procedure used to diagnose and remove tumors within the bladder. TURBT is the most common treatment for early stage bladder cancer. ° °General instructions: °   ° Your recent bladder surgery requires very little post hospital care but some definite precautions. ° °Despite the fact that no skin incisions were used, the area around the bladder incisions are raw and covered with scabs to promote healing and prevent bleeding. Certain precautions are needed to insure that the scabs are not disturbed over the next 2-4 weeks while the healing proceeds. ° °Because the raw surface inside your bladder and the irritating effects of urine you may expect frequency of urination and/or urgency (a stronger desire to urinate) and perhaps even getting up at night more often. This will usually resolve or improve slowly over the healing period. You may see some blood in your urine over the first 6 weeks. Do not be alarmed, even if the urine was clear for a while. Get off your feet and drink lots of fluids until clearing occurs. If you start to pass clots or don't improve call us. ° °Diet: ° °You may return to your normal diet immediately. Because of the raw surface of your bladder, alcohol, spicy foods, foods high in acid and drinks with caffeine may cause irritation or frequency and should be used in moderation. To keep your urine flowing freely and avoid constipation, drink plenty of fluids during the day (8-10 glasses). Tip: Avoid cranberry juice because it is very acidic. ° °Activity: ° °Your physical activity doesn't need to be restricted. However, if you are very active, you may see some blood in the urine. We suggest that you reduce your activity under the circumstances until the bleeding has stopped. ° °Bowels: ° °It is important to keep your bowels regular during the postoperative  period. Straining with bowel movements can cause bleeding. A bowel movement every other day is reasonable. Use a mild laxative if needed, such as milk of magnesia 2-3 tablespoons, or 2 Dulcolax tablets. Call if you continue to have problems. If you had been taking narcotics for pain, before, during or after your surgery, you may be constipated. Take a laxative if necessary. ° ° ° °Medication: ° °You should resume your pre-surgery medications unless told not to. In addition you may be given an antibiotic to prevent or treat infection. Antibiotics are not always necessary. All medication should be taken as prescribed until the bottles are finished unless you are having an unusual reaction to one of the drugs. ° ° °General Anesthesia, Adult, Care After °This sheet gives you information about how to care for yourself after your procedure. Your health care provider may also give you more specific instructions. If you have problems or questions, contact your health care provider. °What can I expect after the procedure? °After the procedure, the following side effects are common: °· Pain or discomfort at the IV site. °· Nausea. °· Vomiting. °· Sore throat. °· Trouble concentrating. °· Feeling cold or chills. °· Weak or tired. °· Sleepiness and fatigue. °· Soreness and body aches. These side effects can affect parts of the body that were not involved in surgery. °Follow these instructions at home: ° °For at least 24 hours after the procedure: °· Have a responsible adult stay with you. It is important to have someone help care for you until you are awake and alert. °·   Rest as needed. °· Do not: °? Participate in activities in which you could fall or become injured. °? Drive. °? Use heavy machinery. °? Drink alcohol. °? Take sleeping pills or medicines that cause drowsiness. °? Make important decisions or sign legal documents. °? Take care of children on your own. °Eating and drinking °· Follow any instructions from your  health care provider about eating or drinking restrictions. °· When you feel hungry, start by eating small amounts of foods that are soft and easy to digest (bland), such as toast. Gradually return to your regular diet. °· Drink enough fluid to keep your urine pale yellow. °· If you vomit, rehydrate by drinking water, juice, or clear broth. °General instructions °· If you have sleep apnea, surgery and certain medicines can increase your risk for breathing problems. Follow instructions from your health care provider about wearing your sleep device: °? Anytime you are sleeping, including during daytime naps. °? While taking prescription pain medicines, sleeping medicines, or medicines that make you drowsy. °· Return to your normal activities as told by your health care provider. Ask your health care provider what activities are safe for you. °· Take over-the-counter and prescription medicines only as told by your health care provider. °· If you smoke, do not smoke without supervision. °· Keep all follow-up visits as told by your health care provider. This is important. °Contact a health care provider if: °· You have nausea or vomiting that does not get better with medicine. °· You cannot eat or drink without vomiting. °· You have pain that does not get better with medicine. °· You are unable to pass urine. °· You develop a skin rash. °· You have a fever. °· You have redness around your IV site that gets worse. °Get help right away if: °· You have difficulty breathing. °· You have chest pain. °· You have blood in your urine or stool, or you vomit blood. °Summary °· After the procedure, it is common to have a sore throat or nausea. It is also common to feel tired. °· Have a responsible adult stay with you for the first 24 hours after general anesthesia. It is important to have someone help care for you until you are awake and alert. °· When you feel hungry, start by eating small amounts of foods that are soft and easy  to digest (bland), such as toast. Gradually return to your regular diet. °· Drink enough fluid to keep your urine pale yellow. °· Return to your normal activities as told by your health care provider. Ask your health care provider what activities are safe for you. °This information is not intended to replace advice given to you by your health care provider. Make sure you discuss any questions you have with your health care provider. °Document Released: 01/30/2001 Document Revised: 06/09/2017 Document Reviewed: 06/09/2017 °Elsevier Interactive Patient Education © 2019 [employer] ° °

## 2019-03-13 NOTE — Transfer of Care (Signed)
Immediate Anesthesia Transfer of Care Note  Patient: Jeremy Day  Procedure(s) Performed: TRANSURETHRAL RESECTION OF BLADDER TUMOR (TURBT) (N/A )  Patient Location: PACU  Anesthesia Type:General  Level of Consciousness: awake, sedated and responds to stimulation  Airway & Oxygen Therapy: Patient Spontanous Breathing and Patient connected to face mask oxygen  Post-op Assessment: Report given to RN and Post -op Vital signs reviewed and stable  Post vital signs: Reviewed and stable  Last Vitals:  Vitals Value Taken Time  BP    Temp    Pulse    Resp    SpO2      Last Pain:  Vitals:   03/13/19 0732  TempSrc: Oral      Patients Stated Pain Goal: 3 (41/44/36 0165)  Complications: No apparent anesthesia complications

## 2019-03-14 ENCOUNTER — Encounter (HOSPITAL_COMMUNITY): Payer: Self-pay | Admitting: Urology

## 2019-03-19 DIAGNOSIS — C678 Malignant neoplasm of overlapping sites of bladder: Secondary | ICD-10-CM | POA: Diagnosis not present

## 2019-03-19 DIAGNOSIS — R310 Gross hematuria: Secondary | ICD-10-CM | POA: Diagnosis not present

## 2019-04-04 ENCOUNTER — Telehealth: Payer: Self-pay | Admitting: Cardiology

## 2019-04-04 NOTE — Telephone Encounter (Signed)
Reroute

## 2019-04-04 NOTE — Telephone Encounter (Signed)
New Message    Pt is returning call for [facility]    Please call back

## 2019-04-09 DIAGNOSIS — C678 Malignant neoplasm of overlapping sites of bladder: Secondary | ICD-10-CM | POA: Diagnosis not present

## 2019-04-16 DIAGNOSIS — Z5111 Encounter for antineoplastic chemotherapy: Secondary | ICD-10-CM | POA: Diagnosis not present

## 2019-04-16 DIAGNOSIS — C678 Malignant neoplasm of overlapping sites of bladder: Secondary | ICD-10-CM | POA: Diagnosis not present

## 2019-04-23 DIAGNOSIS — C678 Malignant neoplasm of overlapping sites of bladder: Secondary | ICD-10-CM | POA: Diagnosis not present

## 2019-04-23 DIAGNOSIS — Z5111 Encounter for antineoplastic chemotherapy: Secondary | ICD-10-CM | POA: Diagnosis not present

## 2019-04-30 DIAGNOSIS — Z5111 Encounter for antineoplastic chemotherapy: Secondary | ICD-10-CM | POA: Diagnosis not present

## 2019-04-30 DIAGNOSIS — C678 Malignant neoplasm of overlapping sites of bladder: Secondary | ICD-10-CM | POA: Diagnosis not present

## 2019-05-07 DIAGNOSIS — C678 Malignant neoplasm of overlapping sites of bladder: Secondary | ICD-10-CM | POA: Diagnosis not present

## 2019-05-07 DIAGNOSIS — Z5111 Encounter for antineoplastic chemotherapy: Secondary | ICD-10-CM | POA: Diagnosis not present

## 2019-05-14 DIAGNOSIS — Z5111 Encounter for antineoplastic chemotherapy: Secondary | ICD-10-CM | POA: Diagnosis not present

## 2019-05-14 DIAGNOSIS — C678 Malignant neoplasm of overlapping sites of bladder: Secondary | ICD-10-CM | POA: Diagnosis not present

## 2019-06-14 DIAGNOSIS — M545 Low back pain: Secondary | ICD-10-CM | POA: Diagnosis not present

## 2019-07-12 DIAGNOSIS — Z5181 Encounter for therapeutic drug level monitoring: Secondary | ICD-10-CM | POA: Diagnosis not present

## 2019-07-12 DIAGNOSIS — R7303 Prediabetes: Secondary | ICD-10-CM | POA: Diagnosis not present

## 2019-07-12 DIAGNOSIS — E782 Mixed hyperlipidemia: Secondary | ICD-10-CM | POA: Diagnosis not present

## 2019-07-23 DIAGNOSIS — Z0000 Encounter for general adult medical examination without abnormal findings: Secondary | ICD-10-CM | POA: Diagnosis not present

## 2019-07-23 DIAGNOSIS — M150 Primary generalized (osteo)arthritis: Secondary | ICD-10-CM | POA: Diagnosis not present

## 2019-07-23 DIAGNOSIS — F330 Major depressive disorder, recurrent, mild: Secondary | ICD-10-CM | POA: Diagnosis not present

## 2019-07-23 DIAGNOSIS — I712 Thoracic aortic aneurysm, without rupture: Secondary | ICD-10-CM | POA: Diagnosis not present

## 2019-07-29 DIAGNOSIS — L400 Psoriasis vulgaris: Secondary | ICD-10-CM | POA: Diagnosis not present

## 2019-07-29 DIAGNOSIS — Z85828 Personal history of other malignant neoplasm of skin: Secondary | ICD-10-CM | POA: Diagnosis not present

## 2019-07-29 DIAGNOSIS — Z23 Encounter for immunization: Secondary | ICD-10-CM | POA: Diagnosis not present

## 2019-07-29 DIAGNOSIS — L570 Actinic keratosis: Secondary | ICD-10-CM | POA: Diagnosis not present

## 2019-07-29 DIAGNOSIS — L821 Other seborrheic keratosis: Secondary | ICD-10-CM | POA: Diagnosis not present

## 2019-07-29 DIAGNOSIS — L814 Other melanin hyperpigmentation: Secondary | ICD-10-CM | POA: Diagnosis not present

## 2019-08-23 DIAGNOSIS — C678 Malignant neoplasm of overlapping sites of bladder: Secondary | ICD-10-CM | POA: Diagnosis not present

## 2019-08-27 ENCOUNTER — Other Ambulatory Visit: Payer: Self-pay | Admitting: Urology

## 2019-09-19 ENCOUNTER — Other Ambulatory Visit (HOSPITAL_COMMUNITY)
Admission: RE | Admit: 2019-09-19 | Discharge: 2019-09-19 | Disposition: A | Discharge status: Home / Self Care | Payer: PPO | Source: Ambulatory Visit | Attending: Urology | Admitting: Urology

## 2019-09-19 ENCOUNTER — Encounter (HOSPITAL_BASED_OUTPATIENT_CLINIC_OR_DEPARTMENT_OTHER): Payer: Self-pay | Admitting: *Deleted

## 2019-09-19 DIAGNOSIS — Z01812 Encounter for preprocedural laboratory examination: Secondary | ICD-10-CM | POA: Insufficient documentation

## 2019-09-19 DIAGNOSIS — Z20828 Contact with and (suspected) exposure to other viral communicable diseases: Secondary | ICD-10-CM | POA: Insufficient documentation

## 2019-09-20 ENCOUNTER — Encounter (HOSPITAL_BASED_OUTPATIENT_CLINIC_OR_DEPARTMENT_OTHER): Payer: Self-pay | Admitting: *Deleted

## 2019-09-20 ENCOUNTER — Other Ambulatory Visit: Payer: Self-pay

## 2019-09-20 LAB — NOVEL CORONAVIRUS, NAA (HOSP ORDER, SEND-OUT TO REF LAB; TAT 18-24 HRS): SARS-CoV-2, NAA: NOT DETECTED

## 2019-09-20 NOTE — Progress Notes (Signed)
Anesthesia Chart Review:  Case: [id number] Date/Time: 09/23/19 0715   Procedure: TRANSURETHRAL RESECTION OF BLADDER TUMOR WITH GEMCITABINE (N/A )   Anesthesia type: General   Pre-op diagnosis: BLADDER TUMOR   Location: [facility] OR ROOM 2 / [place]   Surgeon: Lucas Mallow, MD      DISCUSSION: Patient is a 68 year old male scheduled for the above procedure at [facility]. Known bladder tumor history, s/p TURBT on 02/06/19, 02/20/19, 03/13/19 at [place].   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 Last BMI 25.77. He had SB without ectopy on March EKG. Stable dilated aortic root at 4.3 cm 04/2016 with ~ 3 year follow-up planned (scheduled to see Dr. Marlou Porch next month). Discussed with anesthesiologist Andres Shad, MD and if no acute changes felt to be appropriate candidate for [facility]. He is a same day work-up, so anesthesiologist to assess on the day of surgery.   09/19/19 COVID-19 test is in process.    VS: Ht 6' (1.829 m)   Wt 86.2 kg   BMI 25.77 kg/m    PROVIDERS: [name], MD is PCP  - Candee Furbish, MD is cardiologist. Seen in 2012 and again in 2017. First seen in 12/2010 for trigeminy/bigeminy PVCs. Dehydration felt to be playing a role. Apparently had imaging or echo prior to 2017 that showed dilated ascending aorta 4.1-4.5 cm. At his 03/27/16 visit, echo ordered to re-evaluate and showed stable aortic root at 43 mm with consideration of repeat evaluation in 2-3 years. In regards to PVCs, palpitations, patient was not having any significant symptoms, so continued observation. B-blocker not given due to low normal BP.     LABS: Labs on arrival  per anesthesiologist/surgeon. As of 03/08/19 Cr 1.11, glucose 110, CBC WNL.    EKG: 02/05/2019 Rate 59 bpm Sinus bradycardia Cannot rule out inferior infarct, age undetermined Abnormal ECG   CV: Echo 04/11/2016 Study Conclusions  - Left ventricle: The cavity size was normal. There was mild focal basal hypertrophy of the septum. The estimated ejection fraction was 55%. Wall motion was normal; there were no regional wall motion abnormalities. Doppler parameters are consistent with abnormal left ventricular relaxation (grade 1 diastolic dysfunction). - Aortic valve: Trileaflet. There was no stenosis. - Aorta: Mildly dilated aortic root and ascending aorta. Aortic root dimension: 43 mm (ED). Ascending aortic diameter: 41 mm (S). - Mitral valve: There was no significant regurgitation. - Right ventricle: The cavity size was normal. Systolic function was normal. - Tricuspid valve: Peak RV-RA gradient (S): 20 mm Hg. - Pulmonary arteries: PA peak pressure: 23 mm Hg (S). - Inferior vena cava: The vessel was normal in size. The respirophasic diameter changes were in the normal range (>= 50%), consistent with normal central venous pressure.  Impressions:  - Normal LV size with mild focal basal septal hypertrophy. EF 55%. Normal RV size and systolic function. Mildly dilated aortic root and ascending aortra. Trileaflet aortic valve without regurgitation.   Past Medical History:  Diagnosis Date  . Ascending aorta dilatation (HCC)    echo  04-11-2016  57mm and dilated aortc root 66mm  . Bladder tumor   . BPH (benign prostatic hyperplasia)   . Cancer ([place]) 2020  bladder  . Chronic rhinitis   . Depression    no meds  . Diverticulosis   . Full dentures   . GERD (gastroesophageal reflux disease)   . Hiatal hernia   . History of colon polyps   . History of staphylococcal infection 2008   right ankle  w/ sepsis  . OA (osteoarthritis)    lower back, right  ankle,, bilateral knees and shouldres  . Pre-diabetes   . Psoriasis   . PVC's (premature ventricular contractions)    2012-- hx bigeminy/ trigeminy with near syncope (cardiology consult note in epic dated 2012)  (last cardiology visit in epic w/ dr Marlou Porch, 03-27-2016)  . Seasonal allergies   . Urgency of urination     Past Surgical History:  Procedure Laterality Date  . ANKLE ARTHROSCOPY Right 02-13-2007    dr [name]   w/ debridement and removal loose body  . COLONOSCOPY  last one summer 2019  . INGUINAL HERNIA REPAIR Right 1980s  . INGUINAL LYMPH NODE BIOPSY  2001  approx.   benign  . TRANSURETHRAL RESECTION OF BLADDER TUMOR N/A 02/06/2019   Procedure: TRANSURETHRAL RESECTION OF BLADDER TUMOR (TURBT);  Surgeon: Lucas Mallow, MD;  Location: [facility] ORS;  Service: Urology;  Laterality: N/A;  . TRANSURETHRAL RESECTION OF BLADDER TUMOR N/A 02/20/2019   Procedure: TRANSURETHRAL RESECTION OF BLADDER TUMOR (TURBT);  Surgeon: Lucas Mallow, MD;  Location: [facility] ORS;  Service: Urology;  Laterality: N/A;  . TRANSURETHRAL RESECTION OF BLADDER TUMOR N/A 03/13/2019   Procedure: TRANSURETHRAL RESECTION OF BLADDER TUMOR (TURBT);  Surgeon: Lucas Mallow, MD;  Location: [facility] ORS;  Service: Urology;  Laterality: N/A;    MEDICATIONS: No current facility-administered medications for this encounter.    . escitalopram (LEXAPRO) 10 MG tablet  . acetaminophen (TYLENOL) 500 MG tablet  . calcium carbonate (TUMS - DOSED IN MG ELEMENTAL CALCIUM) 500 MG chewable tablet  . clobetasol cream (TEMOVATE) 0.05 %  . diclofenac (VOLTAREN) 75 MG EC tablet  . diphenhydrAMINE (BENADRYL) 25 MG tablet  . famotidine (PEPCID) 20 MG tablet  . HYDROcodone-acetaminophen (NORCO/VICODIN) 5-325 MG tablet  . mometasone (NASONEX) 50 MCG/ACT nasal spray  . pimecrolimus (ELIDEL) 1 % cream  . Polyethyl Glycol-Propyl Glycol (SYSTANE OP)  . tamsulosin (FLOMAX) 0.4 MG CAPS capsule     Myra Gianotti, PA-C Surgical Short  Stay/Anesthesiology [facility] Phone [phone] [facility] Phone [phone] 09/20/2019 10:53 AM

## 2019-09-20 NOTE — Progress Notes (Signed)
Spoke w/ via phone for pre-op interview---[facility] Lab needs dos----   I stat 8           Lab results------ COVID test ------09-19-2019 Arrive at -------530 am NPO after ------midnight Medications to take morning of surgery -----famotidine, tamsulosin, escitalopram Diabetic medication -----n/a Patient Special Instructions ----- Pre-Op special Istructions ----- Patient verbalized understanding of instructions that were given at this phone interview. Patient denies shortness of breath, chest pain, fever, cough a this phone interview.  Anesthesia Review: secure chat routed to [place]  [id number] webb Cardiologist : dr mark Nita Sickle 03-28-16 epic, has appointment to see dr Marlou Porch 12- 07-2019 Chest x-ray :none EKG :02-05-19 epic Echo :04-11-16 epic Cardiac Cath :  Sleep Study/ CPAP :none Fasting Blood Sugar :      / Checks Blood Sugar -- times a day:   Blood Thinner/ Instructions /Last Dose: ASA / Instructions/ Last Dose :   Patient denies shortness of breath, chest pain, fever, and cough at this phone interview.

## 2019-09-20 NOTE — Progress Notes (Signed)
PATIENT OK FOR SURGERY CENTER PER [facility]

## 2019-09-20 NOTE — Anesthesia Preprocedure Evaluation (Addendum)
Anesthesia Evaluation  Patient identified by MRN, date of birth, ID band Patient awake    Reviewed: Allergy & Precautions, NPO status , Patient's Chart, lab work & pertinent test results  Airway Mallampati: II  TM Distance: >3 FB Neck ROM: Full    Dental  (+) Edentulous Upper, Edentulous Lower   Pulmonary former smoker,    Pulmonary exam normal breath sounds clear to auscultation       Cardiovascular negative cardio ROS Normal cardiovascular exam Rhythm:Regular Rate:Normal  EKG 02/05/19 SB unchanged from previous tracing   Neuro/Psych PSYCHIATRIC DISORDERS Depression negative neurological ROS     GI/Hepatic Neg liver ROS, hiatal hernia, GERD  Medicated and Controlled,  Endo/Other  negative endocrine ROS  Renal/GU negative Renal ROS   Bladder tumor negative genitourinary   Musculoskeletal  (+) Arthritis , Osteoarthritis,    Abdominal   Peds  Hematology negative hematology ROS (+)   Anesthesia Other Findings   Reproductive/Obstetrics                           Anesthesia Physical Anesthesia Plan  ASA: II  Anesthesia Plan: General   Post-op Pain Management:    Induction: Intravenous  PONV Risk Score and Plan:   Airway Management Planned: LMA  Additional Equipment:   Intra-op Plan:   Post-operative Plan: Extubation in OR  Informed Consent: I have reviewed the patients History and Physical, chart, labs and discussed the procedure including the risks, benefits and alternatives for the proposed anesthesia with the patient or authorized representative who has indicated his/her understanding and acceptance.       Plan Discussed with: CRNA and Surgeon  Anesthesia Plan Comments: (PAT note written 09/20/2019 by Myra Gianotti, PA-C. SAME DAY WORK-UP   )      Anesthesia Quick Evaluation

## 2019-09-23 ENCOUNTER — Ambulatory Visit (HOSPITAL_BASED_OUTPATIENT_CLINIC_OR_DEPARTMENT_OTHER)
Admission: RE | Admit: 2019-09-23 | Discharge: 2019-09-23 | Disposition: A | Discharge status: Home / Self Care | Payer: PPO | Attending: Urology | Admitting: Urology

## 2019-09-23 ENCOUNTER — Encounter (HOSPITAL_BASED_OUTPATIENT_CLINIC_OR_DEPARTMENT_OTHER): Payer: Self-pay

## 2019-09-23 ENCOUNTER — Ambulatory Visit (HOSPITAL_BASED_OUTPATIENT_CLINIC_OR_DEPARTMENT_OTHER): Payer: PPO | Admitting: Vascular Surgery

## 2019-09-23 ENCOUNTER — Encounter (HOSPITAL_BASED_OUTPATIENT_CLINIC_OR_DEPARTMENT_OTHER)
Admission: RE | Disposition: A | Discharge status: Home / Self Care | Payer: Self-pay | Source: Home / Self Care | Attending: Urology

## 2019-09-23 DIAGNOSIS — Z7951 Long term (current) use of inhaled steroids: Secondary | ICD-10-CM | POA: Insufficient documentation

## 2019-09-23 DIAGNOSIS — K219 Gastro-esophageal reflux disease without esophagitis: Secondary | ICD-10-CM | POA: Insufficient documentation

## 2019-09-23 DIAGNOSIS — C674 Malignant neoplasm of posterior wall of bladder: Secondary | ICD-10-CM | POA: Diagnosis not present

## 2019-09-23 DIAGNOSIS — R3915 Urgency of urination: Secondary | ICD-10-CM | POA: Insufficient documentation

## 2019-09-23 DIAGNOSIS — R351 Nocturia: Secondary | ICD-10-CM | POA: Diagnosis not present

## 2019-09-23 DIAGNOSIS — C679 Malignant neoplasm of bladder, unspecified: Secondary | ICD-10-CM | POA: Diagnosis not present

## 2019-09-23 DIAGNOSIS — M150 Primary generalized (osteo)arthritis: Secondary | ICD-10-CM | POA: Diagnosis not present

## 2019-09-23 DIAGNOSIS — Z79899 Other long term (current) drug therapy: Secondary | ICD-10-CM | POA: Insufficient documentation

## 2019-09-23 DIAGNOSIS — Z791 Long term (current) use of non-steroidal anti-inflammatories (NSAID): Secondary | ICD-10-CM | POA: Insufficient documentation

## 2019-09-23 DIAGNOSIS — Z87891 Personal history of nicotine dependence: Secondary | ICD-10-CM | POA: Insufficient documentation

## 2019-09-23 DIAGNOSIS — Z8551 Personal history of malignant neoplasm of bladder: Secondary | ICD-10-CM | POA: Diagnosis not present

## 2019-09-23 DIAGNOSIS — N401 Enlarged prostate with lower urinary tract symptoms: Secondary | ICD-10-CM | POA: Insufficient documentation

## 2019-09-23 DIAGNOSIS — F329 Major depressive disorder, single episode, unspecified: Secondary | ICD-10-CM | POA: Diagnosis not present

## 2019-09-23 DIAGNOSIS — Z79891 Long term (current) use of opiate analgesic: Secondary | ICD-10-CM | POA: Insufficient documentation

## 2019-09-23 DIAGNOSIS — D494 Neoplasm of unspecified behavior of bladder: Secondary | ICD-10-CM | POA: Diagnosis not present

## 2019-09-23 HISTORY — DX: Diaphragmatic hernia without obstruction or gangrene: K44.9

## 2019-09-23 HISTORY — PX: TRANSURETHRAL RESECTION OF BLADDER TUMOR WITH MITOMYCIN-C: SHX6459

## 2019-09-23 HISTORY — DX: Prediabetes: R73.03

## 2019-09-23 LAB — POCT I-STAT, CHEM 8
BUN: 13 mg/dL (ref 8–23)
Calcium, Ion: 1.24 mmol/L (ref 1.15–1.40)
Chloride: 102 mmol/L (ref 98–111)
Creatinine, Ser: 0.8 mg/dL (ref 0.61–1.24)
Glucose, Bld: 97 mg/dL (ref 70–99)
HCT: 49 % (ref 39.0–52.0)
Hemoglobin: 16.7 g/dL (ref 13.0–17.0)
Potassium: 3.8 mmol/L (ref 3.5–5.1)
Sodium: 142 mmol/L (ref 135–145)
TCO2: 25 mmol/L (ref 22–32)

## 2019-09-23 SURGERY — TRANSURETHRAL RESECTION OF BLADDER TUMOR WITH MITOMYCIN-C
Anesthesia: General | Site: Bladder

## 2019-09-23 MED ORDER — PROPOFOL 10 MG/ML IV BOLUS
INTRAVENOUS | Status: AC
  Filled 2019-09-23: qty 40

## 2019-09-23 MED ORDER — ONDANSETRON HCL 4 MG/2ML IJ SOLN
INTRAMUSCULAR | Status: DC | PRN
  Administered 2019-09-23: 4 mg via INTRAVENOUS

## 2019-09-23 MED ORDER — DEXAMETHASONE SODIUM PHOSPHATE 10 MG/ML IJ SOLN
INTRAMUSCULAR | Status: AC
  Filled 2019-09-23: qty 1

## 2019-09-23 MED ORDER — MEPERIDINE HCL 25 MG/ML IJ SOLN
6.2500 mg | INTRAMUSCULAR | Status: DC | PRN
  Filled 2019-09-23: qty 1

## 2019-09-23 MED ORDER — EPHEDRINE SULFATE-NACL 50-0.9 MG/10ML-% IV SOSY
PREFILLED_SYRINGE | INTRAVENOUS | Status: DC | PRN
  Administered 2019-09-23 (×2): 10 mg via INTRAVENOUS

## 2019-09-23 MED ORDER — LIDOCAINE 2% (20 MG/ML) 5 ML SYRINGE
INTRAMUSCULAR | Status: AC
  Filled 2019-09-23: qty 5

## 2019-09-23 MED ORDER — FENTANYL CITRATE (PF) 100 MCG/2ML IJ SOLN
INTRAMUSCULAR | Status: AC
  Filled 2019-09-23: qty 2

## 2019-09-23 MED ORDER — ONDANSETRON HCL 4 MG/2ML IJ SOLN
INTRAMUSCULAR | Status: AC
  Filled 2019-09-23: qty 2

## 2019-09-23 MED ORDER — FENTANYL CITRATE (PF) 100 MCG/2ML IJ SOLN
INTRAMUSCULAR | Status: DC | PRN
  Administered 2019-09-23 (×2): 25 ug via INTRAVENOUS
  Administered 2019-09-23: 50 ug via INTRAVENOUS

## 2019-09-23 MED ORDER — CEFAZOLIN SODIUM-DEXTROSE 2-4 GM/100ML-% IV SOLN
2.0000 g | INTRAVENOUS | Status: AC
  Administered 2019-09-23: 2 g via INTRAVENOUS
  Filled 2019-09-23: qty 100

## 2019-09-23 MED ORDER — LIDOCAINE 2% (20 MG/ML) 5 ML SYRINGE
INTRAMUSCULAR | Status: DC | PRN
  Administered 2019-09-23: 50 mg via INTRAVENOUS

## 2019-09-23 MED ORDER — CEFAZOLIN SODIUM-DEXTROSE 2-4 GM/100ML-% IV SOLN
INTRAVENOUS | Status: AC
  Filled 2019-09-23: qty 100

## 2019-09-23 MED ORDER — FENTANYL CITRATE (PF) 100 MCG/2ML IJ SOLN
25.0000 ug | INTRAMUSCULAR | Status: DC | PRN
  Filled 2019-09-23: qty 1

## 2019-09-23 MED ORDER — ONDANSETRON HCL 4 MG/2ML IJ SOLN
4.0000 mg | Freq: Once | INTRAMUSCULAR | Status: DC | PRN
  Filled 2019-09-23: qty 2

## 2019-09-23 MED ORDER — OXYCODONE HCL 5 MG PO TABS
5.0000 mg | ORAL_TABLET | Freq: Once | ORAL | Status: DC | PRN
  Filled 2019-09-23: qty 1

## 2019-09-23 MED ORDER — DEXAMETHASONE SODIUM PHOSPHATE 10 MG/ML IJ SOLN
INTRAMUSCULAR | Status: DC | PRN
  Administered 2019-09-23 (×2): 5 mg via INTRAVENOUS

## 2019-09-23 MED ORDER — EPHEDRINE 5 MG/ML INJ
INTRAVENOUS | Status: AC
  Filled 2019-09-23: qty 10

## 2019-09-23 MED ORDER — OXYCODONE HCL 5 MG/5ML PO SOLN
5.0000 mg | Freq: Once | ORAL | Status: DC | PRN
  Filled 2019-09-23: qty 5

## 2019-09-23 MED ORDER — PROPOFOL 10 MG/ML IV BOLUS
INTRAVENOUS | Status: DC | PRN
  Administered 2019-09-23: 150 mg via INTRAVENOUS

## 2019-09-23 MED ORDER — HYDROCODONE-ACETAMINOPHEN 5-325 MG PO TABS: 1.0000 | ORAL_TABLET | ORAL | 0 refills | Status: AC | PRN

## 2019-09-23 MED ORDER — LACTATED RINGERS IV SOLN
INTRAVENOUS | Status: DC
  Administered 2019-09-23: 06:00:00 via INTRAVENOUS
  Filled 2019-09-23: qty 1000

## 2019-09-23 MED ORDER — GLYCOPYRROLATE PF 0.2 MG/ML IJ SOSY
PREFILLED_SYRINGE | INTRAMUSCULAR | Status: AC
  Filled 2019-09-23: qty 1

## 2019-09-23 MED ORDER — SODIUM CHLORIDE 0.9 % IR SOLN
Status: DC | PRN
  Administered 2019-09-23: 6000 mL via INTRAVESICAL

## 2019-09-23 MED ORDER — MIDAZOLAM HCL 2 MG/2ML IJ SOLN
INTRAMUSCULAR | Status: AC
  Filled 2019-09-23: qty 2

## 2019-09-23 MED ORDER — BELLADONNA ALKALOIDS-OPIUM 16.2-60 MG RE SUPP
RECTAL | Status: AC
  Filled 2019-09-23: qty 1

## 2019-09-23 MED ORDER — GEMCITABINE CHEMO FOR BLADDER INSTILLATION 2000 MG
2000.0000 mg | Freq: Once | INTRAVENOUS | Status: AC
  Administered 2019-09-23: 09:00:00 2000 mg via INTRAVESICAL
  Filled 2019-09-23: qty 2000

## 2019-09-23 SURGICAL SUPPLY — 25 items
BAG DRAIN URO-CYSTO SKYTR STRL (DRAIN) ×2 IMPLANT
BAG DRN RND TRDRP ANRFLXCHMBR (UROLOGICAL SUPPLIES) ×1
BAG DRN UROCATH (DRAIN) ×1
BAG URINE DRAIN 2000ML AR STRL (UROLOGICAL SUPPLIES) ×1 IMPLANT
CATH FOLEY 2WAY SLVR  5CC 18FR (CATHETERS) ×1
CATH FOLEY 2WAY SLVR 5CC 18FR (CATHETERS) IMPLANT
CLOTH BEACON ORANGE TIMEOUT ST (SAFETY) ×2 IMPLANT
GLOVE BIO SURGEON STRL SZ7.5 (GLOVE) ×2 IMPLANT
GLOVE BIOGEL PI IND STRL 7.5 (GLOVE) IMPLANT
GLOVE BIOGEL PI IND STRL 8.5 (GLOVE) IMPLANT
GLOVE BIOGEL PI INDICATOR 7.5 (GLOVE) ×1
GLOVE BIOGEL PI INDICATOR 8.5 (GLOVE) ×2
GLOVE SURG SS PI 8.5 STRL IVOR (GLOVE) ×1
GLOVE SURG SS PI 8.5 STRL STRW (GLOVE) IMPLANT
GOWN STRL REUS W/ TWL LRG LVL3 (GOWN DISPOSABLE) ×1 IMPLANT
GOWN STRL REUS W/ TWL XL LVL3 (GOWN DISPOSABLE) IMPLANT
GOWN STRL REUS W/TWL LRG LVL3 (GOWN DISPOSABLE) ×2
GOWN STRL REUS W/TWL XL LVL3 (GOWN DISPOSABLE) ×2
HOLDER FOLEY CATH W/STRAP (MISCELLANEOUS) ×1 IMPLANT
IV NS IRRIG 3000ML ARTHROMATIC (IV SOLUTION) ×2 IMPLANT
KIT TURNOVER CYSTO (KITS) ×2 IMPLANT
LOOP CUT BIPOLAR 24F LRG (ELECTROSURGICAL) ×1 IMPLANT
MANIFOLD NEPTUNE II (INSTRUMENTS) ×1 IMPLANT
PACK CYSTO (CUSTOM PROCEDURE TRAY) ×2 IMPLANT
TUBE CONNECTING 12X1/4 (SUCTIONS) ×1 IMPLANT

## 2019-09-23 NOTE — Anesthesia Postprocedure Evaluation (Signed)
Anesthesia Post Note  Patient: Jeremy Day  Procedure(s) Performed: TRANSURETHRAL RESECTION OF BLADDER TUMOR WITH GEMCITABINE INSTILLATION INTO THE BLADDER (N/A Bladder)     Patient location during evaluation: PACU Anesthesia Type: General Level of consciousness: awake and alert and oriented Pain management: pain level controlled Vital Signs Assessment: post-procedure vital signs reviewed and stable Respiratory status: spontaneous breathing, nonlabored ventilation and respiratory function stable Cardiovascular status: blood pressure returned to baseline and stable Postop Assessment: no apparent nausea or vomiting Anesthetic complications: no    Last Vitals:  Vitals:   09/23/19 0900 09/23/19 0915  BP: 124/81 120/80  Pulse: (!) 53 (!) 51  Resp: 12 (!) 21  Temp:    SpO2: 94% 95%    Last Pain:  Vitals:   09/23/19 0915  TempSrc:   PainSc: 0-No pain                 Nefertari Rebman A.

## 2019-09-23 NOTE — Discharge Instructions (Addendum)
Transurethral Resection of Bladder Tumor (TURBT) or Bladder Biopsy   Definition:  Transurethral Resection of the Bladder Tumor is a surgical procedure used to diagnose and remove tumors within the bladder. TURBT is the most common treatment for early stage bladder cancer.  General instructions:     Your recent bladder surgery requires very little post hospital care but some definite precautions.  Despite the fact that no skin incisions were used, the area around the bladder incisions are raw and covered with scabs to promote healing and prevent bleeding. Certain precautions are needed to insure that the scabs are not disturbed over the next 2-4 weeks while the healing proceeds.  Because the raw surface inside your bladder and the irritating effects of urine you may expect frequency of urination and/or urgency (a stronger desire to urinate) and perhaps even getting up at night more often. This will usually resolve or improve slowly over the healing period. You may see some blood in your urine over the first 6 weeks. Do not be alarmed, even if the urine was clear for a while. Get off your feet and drink lots of fluids until clearing occurs. If you start to pass clots or don't improve call us.  Diet:  You may return to your normal diet immediately. Because of the raw surface of your bladder, alcohol, spicy foods, foods high in acid and drinks with caffeine may cause irritation or frequency and should be used in moderation. To keep your urine flowing freely and avoid constipation, drink plenty of fluids during the day (8-10 glasses). Tip: Avoid cranberry juice because it is very acidic.  Activity:  Your physical activity doesn't need to be restricted. However, if you are very active, you may see some blood in the urine. We suggest that you reduce your activity under the circumstances until the bleeding has stopped.  Bowels:  It is important to keep your bowels regular during the postoperative  period. Straining with bowel movements can cause bleeding. A bowel movement every other day is reasonable. Use a mild laxative if needed, such as milk of magnesia 2-3 tablespoons, or 2 Dulcolax tablets. Call if you continue to have problems. If you had been taking narcotics for pain, before, during or after your surgery, you may be constipated. Take a laxative if necessary.    Medication:  You should resume your pre-surgery medications unless told not to. In addition you may be given an antibiotic to prevent or treat infection. Antibiotics are not always necessary. All medication should be taken as prescribed until the bottles are finished unless you are having an unusual reaction to one of the drugs.   Post Anesthesia Home Care Instructions  Activity: Get plenty of rest for the remainder of the day. A responsible individual must stay with you for 24 hours following the procedure.  For the next 24 hours, DO NOT: -Drive a car -Operate machinery -Drink alcoholic beverages -Take any medication unless instructed by your physician -Make any legal decisions or sign important papers.  Meals: Start with liquid foods such as gelatin or soup. Progress to regular foods as tolerated. Avoid greasy, spicy, heavy foods. If nausea and/or vomiting occur, drink only clear liquids until the nausea and/or vomiting subsides. Call your physician if vomiting continues.  Special Instructions/Symptoms: Your throat may feel dry or sore from the anesthesia or the breathing tube placed in your throat during surgery. If this causes discomfort, gargle with warm salt water. The discomfort should disappear within 24 hours.       

## 2019-09-23 NOTE — Transfer of Care (Signed)
Immediate Anesthesia Transfer of Care Note  Patient: LENNIS CALDEIRA  Procedure(s) Performed: Procedure(s) (LRB): TRANSURETHRAL RESECTION OF BLADDER TUMOR WITH GEMCITABINE (N/A)  Patient Location: PACU  Anesthesia Type: General  Level of Consciousness: awake, oriented, sedated and patient cooperative  Airway & Oxygen Therapy: Patient Spontanous Breathing and Patient connected to face mask oxygen  Post-op Assessment: Report given to PACU RN and Post -op Vital signs reviewed and stable  Post vital signs: Reviewed and stable  Complications: No apparent anesthesia complications  Last Vitals:  Vitals Value Taken Time  BP 148/91 09/23/19 0815  Temp    Pulse 71 09/23/19 0816  Resp 17 09/23/19 0816  SpO2 100 % 09/23/19 0816  Vitals shown include unvalidated device data.  Last Pain:  Vitals:   09/23/19 0611  TempSrc: Oral  PainSc: 0-No pain      Patients Stated Pain Goal: 5 (09/23/19 [id number])

## 2019-09-23 NOTE — Anesthesia Procedure Notes (Signed)
Procedure Name: LMA Insertion Date/Time: 09/23/2019 7:44 AM Performed by: Suan Halter, CRNA Pre-anesthesia Checklist: Patient identified, Emergency Drugs available, Suction available and Patient being monitored Patient Re-evaluated:Patient Re-evaluated prior to induction Oxygen Delivery Method: Circle system utilized Preoxygenation: Pre-oxygenation with 100% oxygen Induction Type: IV induction Ventilation: Mask ventilation without difficulty LMA: LMA inserted LMA Size: 4.0 Number of attempts: 1 Airway Equipment and Method: Bite block Placement Confirmation: positive ETCO2 Tube secured with: Tape Dental Injury: Teeth and Oropharynx as per pre-operative assessment

## 2019-09-23 NOTE — Op Note (Signed)
Operative Note  Preoperative diagnosis:  1.  Bladder tumor with history of bladder cancer  Postoperative diagnosis: 1.  Bladder tumor--medium with history of bladder cancer  Procedure(s): 1.  Transurethral resection of bladder tumor--medium 2.  Instillation of gemcitabine into the bladder  Surgeon: Link Snuffer, MD  Assistants: None  Anesthesia: General  Complications: None immediate  EBL: Minimal  Specimens: 1.  Bladder tumor  Drains/Catheters: 1.  18 French Foley catheter  Intraoperative findings: 1.  Normal anterior urethra 2.  Borderline obstructing prostate 3.  Bilateral ureteral orifices were normal.  There was an area of superficial papillary mucosa on the right posterior wall extending laterally.  One area was resected.  No other areas could be scraped with the loop followed by fulguration.  It all appeared superficial.  Indication: 68 year old male with a history of bladder cancer was found to have a recurrence on cystoscopy.  Description of procedure:  The patient was identified and consent was obtained.  The patient was taken to the operating room and placed in the supine position.  The patient was placed under general anesthesia.  Perioperative antibiotics were administered.  The patient was placed in dorsal lithotomy.  Patient was prepped and draped in a standard sterile fashion and a timeout was performed.  A 26 French resectoscope with a visual obturator in place was advanced into the urethra and into the bladder.  This was exchanged for the working element.  On bipolar settings, I resected the area of interest followed by identification of the remainder of the papillary mucosa which was easily scraped away superficially followed by fulguration of the entire bladder tumor bed.  Once all tumor was resected and/or fulgurated, I collected the specimens.  I reinspected the bladder mucosa and there were no tumors.  All resection bed areas had been fulgurated.  I withdrew  the scope and placed an 58 French Foley catheter.  Patient tolerated procedure well was stable postoperatively.  In the PACU, gemcitabine was instilled into the bladder.  This remained for approximately 1 hour at which point it was disposed of properly followed by a voiding trial.  Plan: Follow-up in 1 week for pathology review

## 2019-09-23 NOTE — H&P (Signed)
CC/HPI: CC: Bladder cancer  HPI:  01/24/2019  68 year old male presents with a primary complaint of urgency to urinate with low volume output. He also complains about mildly weak stream, nocturia 3 in difficulty postponing urination. AUA symptom score is 15. He denies a history of urinary tract infections except for once in the 1970s. He denies current hematuria or dysuria. Incidentally, he notes a 2 year history of intermittent gross hematuria. Most recently, the gross hematuria symptoms have increased and he had 3 episodes of gross hematuria last week. He has never had this worked up. He is asymptomatic in this regard. He started Flomax about 2 weeks ago and has not noticed much of a difference.   02/01/2019  Patient underwent a CT IVP. This revealed multiple filling defects in his bladder consistent with likely tumors. Kidney and ureter were normal. PSA at the last visit was normal.   02/11/19: s/p 1st TURBT on 02/06/19. 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 He tolerated well and returns today for pathology review and TOV. Urine has been clear. He has no complaints today. In the operating room, he was found have a large amount of tumor. It was not completely resectable with one surgery.   02/26/2019  Patient is status post his second TURBT on 02/20/2019. 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 He has been doing well with the catheter. Urine is cleared up. He greatly desires to avoid cystectomy. He does understand that he has a large amount of tumor involvement and after 2 resections, I was not able to remove all the tumor. However, given no muscle involvement. He desires for me to try full resection endoscopically. 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   03/19/2019  Patient status post his third TURBT. He tolerated this well. Hematuria is resolving. 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 Muscle  was present in the specimen. All tumor was resected. After this third TURBT.   08/23/2019  Patient completed BCG. He presents for cystoscopy. He denies interval gross hematuria.     ALLERGIES: No Allergies    MEDICATIONS: Phenazopyridine Hcl  Tamsulosin Hcl 0.4 mg capsule  Anti-Depresssant  Calcium Carbonate  Clobetasol Propionate  Diclofenac Sodium  Diphenhydramine Hcl 25 mg capsule  Famotidine 20 mg tablet  Mometasone Furoate  Pimecrolimus 1 % cream     GU PSH: Bladder Instill AntiCA Agent - 05/14/2019, 05/07/2019, 04/30/2019, 04/23/2019, 04/16/2019, 04/09/2019 Cystoscopy - 02/01/2019 Cystoscopy TURBT >5 cm - 03/13/2019, 02/20/2019, 02/06/2019 Locm 300-399Mg /Ml Iodine,1Ml - 01/29/2019     NON-GU PSH: No Non-GU PSH    GU PMH: Bladder Cancer overlapping sites, HGT1 bladder cancer Catheter removed today We discussed proceeding with repeat TURBT for confirmatory staging and residual tumor resection. - 02/11/2019 Bladder tumor/neoplasm - 02/01/2019 BPH w/LUTS - 01/24/2019 Encounter for Prostate Cancer screening - 01/24/2019 Gross hematuria - 01/24/2019 Nocturia - 01/24/2019 Urinary Urgency - 01/24/2019 Weak Urinary Stream - 01/24/2019    NON-GU PMH: No Non-GU PMH    FAMILY HISTORY: No Family History    SOCIAL HISTORY: No Social History    REVIEW OF SYSTEMS:    GU Review Male:   Patient denies frequent urination, hard to postpone urination, burning/ pain with urination, get up at night to urinate, leakage of urine, stream starts and stops, trouble starting your stream, have to strain to urinate , erection problems, and penile pain.  Gastrointestinal (Upper):   Patient denies nausea, vomiting, and indigestion/ heartburn.  Gastrointestinal (Lower):   Patient  denies diarrhea and constipation.  Constitutional:   Patient denies night sweats, weight loss, fatigue, and fever.  Skin:   Patient denies skin rash/ lesion and itching.  Eyes:   Patient denies blurred vision and double vision.  Ears/ Nose/  Throat:   Patient denies sore throat and sinus problems.  Hematologic/Lymphatic:   Patient denies swollen glands and easy bruising.  Cardiovascular:   Patient denies leg swelling and chest pains.  Respiratory:   Patient denies cough and shortness of breath.  Endocrine:   Patient denies excessive thirst.  Musculoskeletal:   Patient denies back pain and joint pain.  Neurological:   Patient denies headaches and dizziness.  Psychologic:   Patient denies depression and anxiety.   VITAL SIGNS:      08/23/2019 09:16 AM  BP 121/74 mmHg  Heart Rate 62 /min  Temperature 97.5 F / 36.3 C   PAST DATA REVIEWED:  Source Of History:  Patient   01/24/19  PSA  Total PSA 2.93 ng/mL    PROCEDURES:         Flexible Cystoscopy - 52000  Risks, benefits, and some of the potential complications of the procedure were discussed at length with the patient including infection, bleeding, voiding discomfort, urinary retention, fever, chills, sepsis, and others. All questions were answered. Informed consent was obtained. Antibiotic prophylaxis was given. Sterile technique and intraurethral analgesia were used.  Meatus:  Normal size. Normal location. Normal condition.  Urethra:  No strictures.  External Sphincter:  Normal.  Verumontanum:  Normal.  Prostate:  Borderline obstructing. Mild hyperplasia.  Bladder Neck:  Non-obstructing.  Ureteral Orifices:  Normal location. Normal size. Normal shape  Bladder:  There was about a 2 cm area of superficial appearing papillary mucosal tumor located on the right posterior wall.      The lower urinary tract was carefully examined. The procedure was well-tolerated and without complications. Antibiotic instructions were given. Instructions were given to call the office immediately for bloody urine, difficulty urinating, urinary retention, painful or frequent urination, fever, chills, nausea, vomiting or other illness. The patient stated that he understood these instructions and  would comply with them.         Urinalysis Dipstick Dipstick Cont'd  Color: Amber Bilirubin: Neg mg/dL  Appearance: Clear Ketones: Neg mg/dL  Specific Gravity: 1.015 Blood: Neg ery/uL  pH: 7.0 Protein: Trace mg/dL  Glucose: Neg mg/dL Urobilinogen: 1.0 mg/dL    Nitrites: Neg    Leukocyte Esterase: Neg leu/uL    ASSESSMENT:      ICD-10 Details  1 GU:   Bladder Cancer overlapping sites - C67.8      PLAN:           Document Letter(s):  Created for Patient: Clinical Summary         Notes:   Proceed with TURBT with instillation of gemcitabine   CC: Dr. [name]   Signed by Link Snuffer, III, M.D. on 08/23/19 at 9:39 AM (EDT

## 2019-09-24 ENCOUNTER — Encounter (HOSPITAL_BASED_OUTPATIENT_CLINIC_OR_DEPARTMENT_OTHER): Payer: Self-pay | Admitting: Urology

## 2019-09-24 LAB — SURGICAL PATHOLOGY

## 2019-10-02 DIAGNOSIS — C678 Malignant neoplasm of overlapping sites of bladder: Secondary | ICD-10-CM | POA: Diagnosis not present

## 2019-10-16 ENCOUNTER — Encounter: Payer: Self-pay | Admitting: Cardiology

## 2019-10-16 ENCOUNTER — Other Ambulatory Visit: Payer: Self-pay

## 2019-10-16 ENCOUNTER — Ambulatory Visit: Payer: PPO | Admitting: Cardiology

## 2019-10-16 VITALS — BP 100/60 | HR 74 | Ht 72.0 in | Wt 191.0 lb

## 2019-10-16 DIAGNOSIS — I77810 Thoracic aortic ectasia: Secondary | ICD-10-CM | POA: Diagnosis not present

## 2019-10-16 DIAGNOSIS — I493 Ventricular premature depolarization: Secondary | ICD-10-CM

## 2019-10-16 NOTE — Patient Instructions (Signed)
Medication Instructions:   Your physician recommends that you continue on your current medications as directed. Please refer to the Current Medication list given to you today.  *If you need a refill on your cardiac medications before your next appointment, please call your pharmacy*    Testing/Procedures:  Your physician has requested that you have an echocardiogram. Echocardiography is a painless test that uses sound waves to create images of your heart. It provides your doctor with information about the size and shape of your heart and how well your heart's chambers and valves are working. This procedure takes approximately one hour. There are no restrictions for this procedure.    Follow-Up: At [facility], you and your health needs are our priority.  As part of our continuing mission to provide you with exceptional heart care, we have created designated Provider Care Teams.  These Care Teams include your primary Cardiologist (physician) and Advanced Practice Providers (APPs -  Physician Assistants and Nurse Practitioners) who all work together to provide you with the care you need, when you need it.  Your next appointment:   2  year(s)  The format for your next appointment:   In Person  Provider:   Candee Furbish, MD

## 2019-10-16 NOTE — Progress Notes (Signed)
Cardiology Office Note:    Date:  10/16/2019   ID:  Jeremy Day, DOB Jul 29, 1951, MRN [id number]  PCP:  [name], MD  Cardiologist:  Candee Furbish, MD  Electrophysiologist:  None   Referring MD: [name], MD     History of Present Illness:    Jeremy Day is a 68 y.o. 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  Previously had an experience when he was driving 20 mile commute and he began to experience abdominal bloating and fullness and sensation in his abdomen felt like a pushing.  He felt thumping of his heart anxious dizzy.  Was seen in the emergency room for further evaluation her EKG showed frequent PVCs in the trigeminy bigeminy pattern.  I placed a monitor on him and observed a few PVCs which were asymptomatic.  Overall he has been doing quite well without any fevers chills nausea vomiting syncope bleeding.  His mother is 79 years old.  No early family history of coronary artery disease.  Smoked until 1986.  Retired Licensed conveyancer.  Past Medical History:  Diagnosis Date   Ascending aorta dilatation ([place])    echo  04-11-2016  12mm and dilated aortc root 73mm   Bladder tumor    BPH (benign prostatic hyperplasia)    Cancer ([place]) 2020   bladder   Chronic rhinitis    Depression    no meds   Diverticulosis    Full dentures    GERD (gastroesophageal reflux disease)    Hiatal hernia    History of colon polyps    History of staphylococcal infection 2008   right ankle  w/ sepsis   OA (osteoarthritis)    lower back, right ankle,, bilateral knees and shouldres   Pre-diabetes    Psoriasis    PVC's (premature ventricular contractions)    2012-- hx bigeminy/ trigeminy with near syncope (cardiology consult note in epic dated 2012)  (last cardiology visit in epic w/ dr Marlou Porch,  03-27-2016)   Seasonal allergies    Urgency of urination     Past Surgical History:  Procedure Laterality Date   ANKLE ARTHROSCOPY Right 02-13-2007    dr [name]   w/ debridement and removal loose body   COLONOSCOPY  last one summer 2019   [place] Right 1980s   INGUINAL LYMPH NODE BIOPSY  2001  approx.   benign   TRANSURETHRAL RESECTION OF BLADDER TUMOR N/A 02/06/2019   Procedure: TRANSURETHRAL RESECTION OF BLADDER TUMOR (TURBT);  Surgeon: Lucas Mallow, MD;  Location: [facility];  Service: Urology;  Laterality: N/A;   TRANSURETHRAL RESECTION OF BLADDER TUMOR N/A 02/20/2019   Procedure: TRANSURETHRAL RESECTION OF BLADDER TUMOR (TURBT);  Surgeon: Lucas Mallow, MD;  Location: [facility];  Service: Urology;  Laterality: N/A;   TRANSURETHRAL RESECTION OF BLADDER TUMOR N/A 03/13/2019   Procedure: TRANSURETHRAL RESECTION OF BLADDER TUMOR (TURBT);  Surgeon: Lucas Mallow, MD;  Location: [facility];  Service: Urology;  Laterality: N/A;   TRANSURETHRAL RESECTION OF BLADDER TUMOR WITH MITOMYCIN-C N/A 09/23/2019   Procedure: TRANSURETHRAL RESECTION OF BLADDER TUMOR WITH GEMCITABINE INSTILLATION INTO THE BLADDER;  Surgeon: Lucas Mallow, MD;  Location: [facility];  Service: Urology;  Laterality: N/A;    Current Medications: Current Meds  Medication Sig   acetaminophen (TYLENOL)  500 MG tablet Take 1,000 mg by mouth every 6 (six) hours as needed (pain.).   calcium carbonate (TUMS - DOSED IN MG ELEMENTAL CALCIUM) 500 MG chewable tablet Chew 1 tablet by mouth daily as needed for indigestion or heartburn.   clobetasol cream (TEMOVATE) [id number] % Apply 1 application topically 2 (two) times daily as needed (psoriasis).    diclofenac (VOLTAREN) 75 MG EC tablet Take 75 mg by mouth daily.   diphenhydrAMINE (BENADRYL) 25 MG tablet Take 25 mg by mouth every 6 (six) hours as needed for allergies.   escitalopram (LEXAPRO) 10 MG tablet Take 10 mg by mouth daily.  Takes 1 10 mg and 1/2 of 10 mg dose is 15 mg   famotidine (PEPCID) 20 MG tablet Take 20 mg by mouth every morning.    HYDROcodone-acetaminophen (NORCO/VICODIN) 5-325 MG tablet Take 1 tablet by mouth every 4 (four) hours as needed for moderate pain.   mometasone (NASONEX) 50 MCG/ACT nasal spray Place 2 sprays into the nose daily as needed (allergies.).    pimecrolimus (ELIDEL) 1 % cream Apply 1 application topically daily as needed (psoriasis).    Polyethyl Glycol-Propyl Glycol (SYSTANE OP) Place 1 drop into both eyes 2 (two) times daily as needed (dry eyes).   tamsulosin (FLOMAX) 0.4 MG CAPS capsule Take 0.4 mg by mouth daily.      Allergies:   Patient has no known allergies.   Social History   Socioeconomic History   Marital status: Married    Spouse name: Not on file   Number of children: Not on file   Years of education: Not on file   Highest education level: Not on file  Occupational History   Not on file  Social Needs   Financial resource strain: Not on file   Food insecurity    Worry: Not on file    Inability: Not on file   Transportation needs    Medical: Not on file    Non-medical: Not on file  Tobacco Use   Smoking status: Former Smoker    Years: 20.00    Types: Cigarettes    Quit date: 02/04/1985    Years since quitting: 34.7   Smokeless tobacco: Former Systems developer    Types: Snuff, Chew    Quit date: 02/04/1997  Substance and Sexual Activity   Alcohol use: No   Drug use: Not Currently    Comment: per pt "weed" in the 1970s none since   Sexual activity: Not on file  Lifestyle   Physical activity    Days per week: Not on file    Minutes per session: Not on file   Stress: Not on file  Relationships   Social connections    Talks on phone: Not on file    Gets together: Not on file    Attends religious service: Not on file    Active member of club or organization: Not on file    Attends meetings of clubs or organizations: Not on file     Relationship status: Not on file  Other Topics Concern   Not on file  Social History Narrative   Not on file     Family History: The patient's family history includes Other in his father. Mother has fainted, skipped beats. Father COPD.   ROS:   Please see the history of present illness.     All other systems reviewed and are negative.  EKGs/Labs/Other Studies Reviewed:    The following studies were reviewed today: Echocardiogram 2017-43 mm  aortic root  EKG: 02/05/2019-sinus rhythm 59 with no other abnormalities.  Personally reviewed  Recent Labs: 03/08/2019: Platelets 217 09/23/2019: BUN 13; Creatinine, Ser 0.80; Hemoglobin 16.7; Potassium 3.8; Sodium 142  Recent Lipid Panel No results found for: CHOL, TRIG, HDL, CHOLHDL, VLDL, LDLCALC, LDLDIRECT  Physical Exam:    VS:  BP 100/60    Pulse 74    Ht 6' (1.829 m)    Wt 191 lb (86.6 kg)    SpO2 97%    BMI 25.90 kg/m     Wt Readings from Last 3 Encounters:  10/16/19 191 lb (86.6 kg)  09/23/19 192 lb (87.1 kg)  03/13/19 190 lb 3.2 oz (86.3 kg)     GEN:  Well nourished, well developed in no acute distress HEENT: Normal NECK: No JVD; No carotid bruits LYMPHATICS: No lymphadenopathy CARDIAC: RRR, no murmurs, rubs, gallops RESPIRATORY:  Clear to auscultation without rales, wheezing or rhonchi  ABDOMEN: Soft, non-tender, non-distended MUSCULOSKELETAL:  No edema; No deformity  SKIN: Warm and dry NEUROLOGIC:  Alert and oriented x 3 PSYCHIATRIC:  Normal affect   ASSESSMENT:    1. Dilated aortic root (HCC)   2. PVC's (premature ventricular contractions)    PLAN:    In order of problems listed above:  Dilated aortic root -From 4.1 to 4.5 cm.  Last echo in 2017 was 43 mm -Check echocardiogram.  Excellent blood pressure no medications.  No changes. -Probably be reasonable to repeat echocardiogram in 3 years  Palpitations/PVC/bigeminy pattern -Continue with exercise hydration etc. has not been much of an issue recently.   Doing very well.  2 year follow up   Medication Adjustments/Labs and Tests Ordered: Current medicines are reviewed at length with the patient today.  Concerns regarding medicines are outlined above.  Orders Placed This Encounter  Procedures   ECHOCARDIOGRAM COMPLETE   No orders of the defined types were placed in this encounter.   Patient Instructions  Medication Instructions:   Your physician recommends that you continue on your current medications as directed. Please refer to the Current Medication list given to you today.  *If you need a refill on your cardiac medications before your next appointment, please call your pharmacy*    Testing/Procedures:  Your physician has requested that you have an echocardiogram. Echocardiography is a painless test that uses sound waves to create images of your heart. It provides your doctor with information about the size and shape of your heart and how well your hearts chambers and valves are working. This procedure takes approximately one hour. There are no restrictions for this procedure.    Follow-Up: At [facility], you and your health needs are our priority.  As part of our continuing mission to provide you with exceptional heart care, we have created designated Provider Care Teams.  These Care Teams include your primary Cardiologist (physician) and Advanced Practice Providers (APPs -  Physician Assistants and Nurse Practitioners) who all work together to provide you with the care you need, when you need it.  Your next appointment:   2  year(s)  The format for your next appointment:   In Person  Provider:   Candee Furbish, MD       Signed, Candee Furbish, MD  10/16/2019 11:45 AM    [place]

## 2019-10-21 DIAGNOSIS — Z5111 Encounter for antineoplastic chemotherapy: Secondary | ICD-10-CM | POA: Diagnosis not present

## 2019-10-21 DIAGNOSIS — C678 Malignant neoplasm of overlapping sites of bladder: Secondary | ICD-10-CM | POA: Diagnosis not present

## 2019-10-28 DIAGNOSIS — Z5111 Encounter for antineoplastic chemotherapy: Secondary | ICD-10-CM | POA: Diagnosis not present

## 2019-10-28 DIAGNOSIS — C678 Malignant neoplasm of overlapping sites of bladder: Secondary | ICD-10-CM | POA: Diagnosis not present

## 2019-10-29 ENCOUNTER — Other Ambulatory Visit: Payer: Self-pay

## 2019-10-29 ENCOUNTER — Ambulatory Visit (HOSPITAL_COMMUNITY): Payer: PPO | Attending: Cardiology

## 2019-10-29 ENCOUNTER — Encounter (INDEPENDENT_AMBULATORY_CARE_PROVIDER_SITE_OTHER): Payer: Self-pay

## 2019-10-29 DIAGNOSIS — I517 Cardiomegaly: Secondary | ICD-10-CM | POA: Insufficient documentation

## 2019-10-29 DIAGNOSIS — I77810 Thoracic aortic ectasia: Secondary | ICD-10-CM

## 2019-10-29 DIAGNOSIS — I340 Nonrheumatic mitral (valve) insufficiency: Secondary | ICD-10-CM | POA: Diagnosis not present

## 2019-11-04 DIAGNOSIS — C678 Malignant neoplasm of overlapping sites of bladder: Secondary | ICD-10-CM | POA: Diagnosis not present

## 2019-11-11 DIAGNOSIS — C678 Malignant neoplasm of overlapping sites of bladder: Secondary | ICD-10-CM | POA: Diagnosis not present

## 2019-11-11 DIAGNOSIS — Z5111 Encounter for antineoplastic chemotherapy: Secondary | ICD-10-CM | POA: Diagnosis not present

## 2019-11-18 DIAGNOSIS — Z5111 Encounter for antineoplastic chemotherapy: Secondary | ICD-10-CM | POA: Diagnosis not present

## 2019-11-18 DIAGNOSIS — C678 Malignant neoplasm of overlapping sites of bladder: Secondary | ICD-10-CM | POA: Diagnosis not present

## 2019-11-19 DIAGNOSIS — M545 Low back pain: Secondary | ICD-10-CM | POA: Diagnosis not present

## 2019-11-25 DIAGNOSIS — Z5111 Encounter for antineoplastic chemotherapy: Secondary | ICD-10-CM | POA: Diagnosis not present

## 2019-11-25 DIAGNOSIS — C678 Malignant neoplasm of overlapping sites of bladder: Secondary | ICD-10-CM | POA: Diagnosis not present

## 2019-11-28 DIAGNOSIS — M545 Low back pain: Secondary | ICD-10-CM | POA: Diagnosis not present

## 2019-12-02 DIAGNOSIS — M48061 Spinal stenosis, lumbar region without neurogenic claudication: Secondary | ICD-10-CM | POA: Diagnosis not present

## 2019-12-02 DIAGNOSIS — M545 Low back pain: Secondary | ICD-10-CM | POA: Diagnosis not present

## 2019-12-10 DIAGNOSIS — Z6826 Body mass index (BMI) 26.0-26.9, adult: Secondary | ICD-10-CM | POA: Diagnosis not present

## 2019-12-10 DIAGNOSIS — M4120 Other idiopathic scoliosis, site unspecified: Secondary | ICD-10-CM | POA: Diagnosis not present

## 2019-12-10 DIAGNOSIS — R030 Elevated blood-pressure reading, without diagnosis of hypertension: Secondary | ICD-10-CM | POA: Diagnosis not present

## 2019-12-10 DIAGNOSIS — M4316 Spondylolisthesis, lumbar region: Secondary | ICD-10-CM | POA: Diagnosis not present

## 2019-12-16 ENCOUNTER — Other Ambulatory Visit: Payer: Self-pay | Admitting: Neurological Surgery

## 2019-12-16 DIAGNOSIS — M4120 Other idiopathic scoliosis, site unspecified: Secondary | ICD-10-CM

## 2019-12-24 ENCOUNTER — Ambulatory Visit
Admission: RE | Admit: 2019-12-24 | Discharge: 2019-12-24 | Disposition: A | Discharge status: Home / Self Care | Payer: PPO | Source: Ambulatory Visit | Attending: Neurological Surgery | Admitting: Neurological Surgery

## 2019-12-24 DIAGNOSIS — M545 Low back pain: Secondary | ICD-10-CM | POA: Diagnosis not present

## 2019-12-24 DIAGNOSIS — M4120 Other idiopathic scoliosis, site unspecified: Secondary | ICD-10-CM

## 2020-01-16 ENCOUNTER — Ambulatory Visit: Payer: PPO | Attending: Internal Medicine

## 2020-01-16 DIAGNOSIS — Z23 Encounter for immunization: Secondary | ICD-10-CM

## 2020-01-16 NOTE — Progress Notes (Signed)
   Covid-19 Vaccination Clinic  Name:  RAMSSES STMARIE    MRN: [id number] DOB: 07-13-1951  01/16/2020  Mr. Halas was observed post Covid-19 immunization for 15 minutes without incident. He was provided with Vaccine Information Sheet and instruction to access the V-Safe system.   Mr. Whelihan was instructed to call 911 with any severe reactions post vaccine: Marland Kitchen Difficulty breathing  . Swelling of face and throat  . A fast heartbeat  . A bad rash all over body  . Dizziness and weakness   Immunizations Administered    Name Date Dose VIS Date Route   Moderna COVID-19 Vaccine 01/16/2020 10:32 AM 0.5 mL 10/08/2019 Intramuscular   Manufacturer: Moderna   Lot: [id number]   [id number]

## 2020-01-28 DIAGNOSIS — Z6826 Body mass index (BMI) 26.0-26.9, adult: Secondary | ICD-10-CM | POA: Diagnosis not present

## 2020-01-28 DIAGNOSIS — I10 Essential (primary) hypertension: Secondary | ICD-10-CM | POA: Diagnosis not present

## 2020-01-28 DIAGNOSIS — M4120 Other idiopathic scoliosis, site unspecified: Secondary | ICD-10-CM | POA: Diagnosis not present

## 2020-02-19 ENCOUNTER — Ambulatory Visit: Payer: PPO | Attending: Internal Medicine

## 2020-02-19 DIAGNOSIS — Z23 Encounter for immunization: Secondary | ICD-10-CM

## 2020-02-19 NOTE — Progress Notes (Signed)
   Covid-19 Vaccination Clinic  Name:  [name]    MRN: [id number] DOB: Mar 17, 1951  02/19/2020  [name] was observed post Covid-19 immunization for 15 minutes without incident. He was provided with Vaccine Information Sheet and instruction to access the V-Safe system.   [name] was instructed to call 911 with any severe reactions post vaccine: Marland Kitchen Difficulty breathing  . Swelling of face and throat  . A fast heartbeat  . A bad rash all over body  . Dizziness and weakness   Immunizations Administered    Name Date Dose VIS Date Route   Moderna COVID-19 Vaccine 02/19/2020 11:59 AM 0.5 mL 10/08/2019 Intramuscular   Manufacturer: Moderna   Lot: [id number]   [id number]

## 2020-02-27 DIAGNOSIS — R3915 Urgency of urination: Secondary | ICD-10-CM | POA: Diagnosis not present

## 2020-02-27 DIAGNOSIS — R350 Frequency of micturition: Secondary | ICD-10-CM | POA: Diagnosis not present

## 2020-02-27 DIAGNOSIS — R351 Nocturia: Secondary | ICD-10-CM | POA: Diagnosis not present

## 2020-02-27 DIAGNOSIS — C678 Malignant neoplasm of overlapping sites of bladder: Secondary | ICD-10-CM | POA: Diagnosis not present

## 2020-03-05 DIAGNOSIS — M4120 Other idiopathic scoliosis, site unspecified: Secondary | ICD-10-CM | POA: Diagnosis not present

## 2020-03-09 DIAGNOSIS — Z5111 Encounter for antineoplastic chemotherapy: Secondary | ICD-10-CM | POA: Diagnosis not present

## 2020-03-09 DIAGNOSIS — C678 Malignant neoplasm of overlapping sites of bladder: Secondary | ICD-10-CM | POA: Diagnosis not present

## 2020-03-16 DIAGNOSIS — C678 Malignant neoplasm of overlapping sites of bladder: Secondary | ICD-10-CM | POA: Diagnosis not present

## 2020-03-16 DIAGNOSIS — Z5111 Encounter for antineoplastic chemotherapy: Secondary | ICD-10-CM | POA: Diagnosis not present

## 2020-03-23 DIAGNOSIS — C678 Malignant neoplasm of overlapping sites of bladder: Secondary | ICD-10-CM | POA: Diagnosis not present

## 2020-03-23 DIAGNOSIS — Z5111 Encounter for antineoplastic chemotherapy: Secondary | ICD-10-CM | POA: Diagnosis not present

## 2020-05-14 DIAGNOSIS — M4120 Other idiopathic scoliosis, site unspecified: Secondary | ICD-10-CM | POA: Diagnosis not present

## 2020-05-14 DIAGNOSIS — M4316 Spondylolisthesis, lumbar region: Secondary | ICD-10-CM | POA: Diagnosis not present

## 2020-05-14 DIAGNOSIS — M47814 Spondylosis without myelopathy or radiculopathy, thoracic region: Secondary | ICD-10-CM | POA: Diagnosis not present

## 2020-05-21 DIAGNOSIS — C678 Malignant neoplasm of overlapping sites of bladder: Secondary | ICD-10-CM | POA: Diagnosis not present

## 2020-06-01 DIAGNOSIS — K7689 Other specified diseases of liver: Secondary | ICD-10-CM | POA: Diagnosis not present

## 2020-06-01 DIAGNOSIS — I700 Atherosclerosis of aorta: Secondary | ICD-10-CM | POA: Diagnosis not present

## 2020-06-01 DIAGNOSIS — C678 Malignant neoplasm of overlapping sites of bladder: Secondary | ICD-10-CM | POA: Diagnosis not present

## 2020-06-01 DIAGNOSIS — K5730 Diverticulosis of large intestine without perforation or abscess without bleeding: Secondary | ICD-10-CM | POA: Diagnosis not present

## 2020-06-01 DIAGNOSIS — C679 Malignant neoplasm of bladder, unspecified: Secondary | ICD-10-CM | POA: Diagnosis not present

## 2020-06-02 ENCOUNTER — Other Ambulatory Visit: Payer: Self-pay | Admitting: Urology

## 2020-06-02 DIAGNOSIS — R351 Nocturia: Secondary | ICD-10-CM | POA: Diagnosis not present

## 2020-06-02 DIAGNOSIS — C678 Malignant neoplasm of overlapping sites of bladder: Secondary | ICD-10-CM | POA: Diagnosis not present

## 2020-06-02 DIAGNOSIS — R3915 Urgency of urination: Secondary | ICD-10-CM | POA: Diagnosis not present

## 2020-06-03 NOTE — Addendum Note (Signed)
Addended by: Link Snuffer D III on: 06/03/2020 01:44 PM   Modules accepted: Orders

## 2020-06-16 ENCOUNTER — Other Ambulatory Visit: Payer: Self-pay

## 2020-06-16 ENCOUNTER — Encounter (HOSPITAL_BASED_OUTPATIENT_CLINIC_OR_DEPARTMENT_OTHER): Payer: Self-pay | Admitting: Urology

## 2020-06-16 NOTE — Progress Notes (Signed)
Spoke w/ via phone for pre-op interview---PT Lab needs dos----     I stat 8, ekg          COVID test ------06-20-2020 at 1005 Arrive at ------630 am 06-24-2020 NPO after MN NO Solid Food.  Clear liquids from MN until---530 am then npol  Medications to take morning of surgery -----famotidine Diabetic medication -----n/a Patient Special Instructions -----none Pre-Op special Istructions -----none Patient verbalized understanding of instructions that were given at this phone interview. Patient denies shortness of breath, chest pain, fever, cough at this phone interview.  Anesthesia Review: hx of pvc's, dilated aortic root  PCP: dr [name] Cardiologist : dr Marlou Porch lov 10-16-2019 epic Chest x-ray :none EKG :none  Echo :10-29-2019 epic Stress test:none Cardiac Cath : none Activity level:  Bad lower back arthritis using 2 canes to walk, having lower  back surgery soon Sleep Study/ CPAP :none Fasting Blood Sugar :      / Checks Blood Sugar -- times a day:  n/a Blood Thinner/ Instructions /Last Dose:n/a ASA / Instructions/ Last Dose : n/a   Patient cannot walk long distances using 2 canes due to needs lower back surgery soon

## 2020-06-20 ENCOUNTER — Other Ambulatory Visit (HOSPITAL_COMMUNITY)
Admission: RE | Admit: 2020-06-20 | Discharge: 2020-06-20 | Disposition: A | Discharge status: Home / Self Care | Payer: PPO | Source: Ambulatory Visit | Attending: Urology | Admitting: Urology

## 2020-06-20 DIAGNOSIS — Z01812 Encounter for preprocedural laboratory examination: Secondary | ICD-10-CM | POA: Insufficient documentation

## 2020-06-20 DIAGNOSIS — Z20822 Contact with and (suspected) exposure to covid-19: Secondary | ICD-10-CM | POA: Diagnosis not present

## 2020-06-20 LAB — SARS CORONAVIRUS 2 (TAT 6-24 HRS): SARS Coronavirus 2: NEGATIVE

## 2020-06-23 NOTE — Anesthesia Preprocedure Evaluation (Addendum)
Anesthesia Evaluation  Patient identified by MRN, date of birth, ID band Patient awake    Reviewed: Allergy & Precautions, NPO status , Patient's Chart, lab work & pertinent test results  Airway Mallampati: I  TM Distance: >3 FB Neck ROM: Full    Dental no notable dental hx. (+) Edentulous Upper, Edentulous Lower   Pulmonary pneumonia, former smoker,    Pulmonary exam normal breath sounds clear to auscultation       Cardiovascular negative cardio ROS Normal cardiovascular exam Rhythm:Regular Rate:Normal  Echo 10/29/19 Left Ventricle: Left ventricular ejection fraction, by visual estimation,  is 60 to 65%. The left ventricle has normal function. The left ventricle  has no regional wall motion abnormalities. There is mildly increased left  ventricular hypertrophy.  Asymmetric left ventricular hypertrophy of the septal wall. Left  ventricular diastolic parameters are consistent with Grade I diastolic  dysfunction (impaired relaxation). Normal left atrial pressure   Neuro/Psych negative neurological ROS  negative psych ROS   GI/Hepatic Neg liver ROS, hiatal hernia, GERD  ,  Endo/Other  negative endocrine ROS  Renal/GU negative Renal ROS  negative genitourinary   Musculoskeletal  (+) Arthritis ,   Abdominal   Peds  Hematology Lab Results      Component                Value               Date                      WBC                      8.5                 03/08/2019                HGB                      16.0                06/24/2020                HCT                      47.0                06/24/2020                MCV                      99.4                03/08/2019                PLT                      217                 03/08/2019              Anesthesia Other Findings   Reproductive/Obstetrics                            Anesthesia Physical Anesthesia Plan  ASA:  II  Anesthesia Plan:    Post-op Pain Management:    Induction: Intravenous  PONV Risk Score and Plan: 3  and Treatment may vary due to age or medical condition, Ondansetron and Dexamethasone  Airway Management Planned: LMA  Additional Equipment: None  Intra-op Plan:   Post-operative Plan:   Informed Consent: I have reviewed the patients History and Physical, chart, labs and discussed the procedure including the risks, benefits and alternatives for the proposed anesthesia with the patient or authorized representative who has indicated his/her understanding and acceptance.     Dental advisory given  Plan Discussed with: CRNA and Anesthesiologist  Anesthesia Plan Comments:        Anesthesia Quick Evaluation

## 2020-06-24 ENCOUNTER — Ambulatory Visit (HOSPITAL_BASED_OUTPATIENT_CLINIC_OR_DEPARTMENT_OTHER): Payer: PPO | Admitting: Anesthesiology

## 2020-06-24 ENCOUNTER — Encounter (HOSPITAL_BASED_OUTPATIENT_CLINIC_OR_DEPARTMENT_OTHER)
Admission: RE | Disposition: A | Discharge status: Home / Self Care | Payer: Self-pay | Source: Home / Self Care | Attending: Urology

## 2020-06-24 ENCOUNTER — Other Ambulatory Visit: Payer: Self-pay

## 2020-06-24 ENCOUNTER — Ambulatory Visit (HOSPITAL_BASED_OUTPATIENT_CLINIC_OR_DEPARTMENT_OTHER)
Admission: RE | Admit: 2020-06-24 | Discharge: 2020-06-24 | Disposition: A | Discharge status: Home / Self Care | Payer: PPO | Attending: Urology | Admitting: Urology

## 2020-06-24 ENCOUNTER — Encounter (HOSPITAL_BASED_OUTPATIENT_CLINIC_OR_DEPARTMENT_OTHER): Payer: Self-pay | Admitting: Urology

## 2020-06-24 DIAGNOSIS — Z79899 Other long term (current) drug therapy: Secondary | ICD-10-CM | POA: Insufficient documentation

## 2020-06-24 DIAGNOSIS — C674 Malignant neoplasm of posterior wall of bladder: Secondary | ICD-10-CM | POA: Insufficient documentation

## 2020-06-24 DIAGNOSIS — Z791 Long term (current) use of non-steroidal anti-inflammatories (NSAID): Secondary | ICD-10-CM | POA: Diagnosis not present

## 2020-06-24 DIAGNOSIS — M479 Spondylosis, unspecified: Secondary | ICD-10-CM | POA: Insufficient documentation

## 2020-06-24 DIAGNOSIS — M19012 Primary osteoarthritis, left shoulder: Secondary | ICD-10-CM | POA: Diagnosis not present

## 2020-06-24 DIAGNOSIS — M19011 Primary osteoarthritis, right shoulder: Secondary | ICD-10-CM | POA: Insufficient documentation

## 2020-06-24 DIAGNOSIS — Z7951 Long term (current) use of inhaled steroids: Secondary | ICD-10-CM | POA: Diagnosis not present

## 2020-06-24 DIAGNOSIS — M19071 Primary osteoarthritis, right ankle and foot: Secondary | ICD-10-CM | POA: Insufficient documentation

## 2020-06-24 DIAGNOSIS — K449 Diaphragmatic hernia without obstruction or gangrene: Secondary | ICD-10-CM | POA: Diagnosis not present

## 2020-06-24 DIAGNOSIS — Z87891 Personal history of nicotine dependence: Secondary | ICD-10-CM | POA: Insufficient documentation

## 2020-06-24 DIAGNOSIS — K219 Gastro-esophageal reflux disease without esophagitis: Secondary | ICD-10-CM | POA: Insufficient documentation

## 2020-06-24 DIAGNOSIS — R7303 Prediabetes: Secondary | ICD-10-CM | POA: Diagnosis not present

## 2020-06-24 DIAGNOSIS — M170 Bilateral primary osteoarthritis of knee: Secondary | ICD-10-CM | POA: Insufficient documentation

## 2020-06-24 DIAGNOSIS — L409 Psoriasis, unspecified: Secondary | ICD-10-CM | POA: Insufficient documentation

## 2020-06-24 DIAGNOSIS — F329 Major depressive disorder, single episode, unspecified: Secondary | ICD-10-CM | POA: Diagnosis not present

## 2020-06-24 DIAGNOSIS — D090 Carcinoma in situ of bladder: Secondary | ICD-10-CM | POA: Diagnosis not present

## 2020-06-24 DIAGNOSIS — D494 Neoplasm of unspecified behavior of bladder: Secondary | ICD-10-CM | POA: Diagnosis not present

## 2020-06-24 DIAGNOSIS — Z8551 Personal history of malignant neoplasm of bladder: Secondary | ICD-10-CM | POA: Diagnosis not present

## 2020-06-24 HISTORY — PX: TRANSURETHRAL RESECTION OF BLADDER TUMOR WITH MITOMYCIN-C: SHX6459

## 2020-06-24 HISTORY — DX: Pneumonia, unspecified organism: J18.9

## 2020-06-24 HISTORY — DX: Unspecified abnormalities of gait and mobility: R26.9

## 2020-06-24 LAB — POCT I-STAT, CHEM 8
BUN: 9 mg/dL (ref 8–23)
Calcium, Ion: 1.21 mmol/L (ref 1.15–1.40)
Chloride: 99 mmol/L (ref 98–111)
Creatinine, Ser: 0.8 mg/dL (ref 0.61–1.24)
Glucose, Bld: 100 mg/dL — ABNORMAL HIGH (ref 70–99)
HCT: 47 % (ref 39.0–52.0)
Hemoglobin: 16 g/dL (ref 13.0–17.0)
Potassium: 3.7 mmol/L (ref 3.5–5.1)
Sodium: 141 mmol/L (ref 135–145)
TCO2: 27 mmol/L (ref 22–32)

## 2020-06-24 SURGERY — TRANSURETHRAL RESECTION OF BLADDER TUMOR WITH MITOMYCIN-C
Anesthesia: General | Site: Bladder

## 2020-06-24 MED ORDER — CEFAZOLIN SODIUM-DEXTROSE 2-4 GM/100ML-% IV SOLN
2.0000 g | INTRAVENOUS | Status: AC
  Administered 2020-06-24: 2 g via INTRAVENOUS

## 2020-06-24 MED ORDER — ONDANSETRON HCL 4 MG/2ML IJ SOLN: 4.0000 mg | Freq: Once | INTRAMUSCULAR | Status: DC | PRN

## 2020-06-24 MED ORDER — GEMCITABINE CHEMO FOR BLADDER INSTILLATION 2000 MG
2000.0000 mg | Freq: Once | INTRAVENOUS | Status: AC
  Administered 2020-06-24: 2000 mg via INTRAVESICAL
  Filled 2020-06-24: qty 2000

## 2020-06-24 MED ORDER — FENTANYL CITRATE (PF) 100 MCG/2ML IJ SOLN
25.0000 ug | INTRAMUSCULAR | Status: DC | PRN
  Administered 2020-06-24: 25 ug via INTRAVENOUS

## 2020-06-24 MED ORDER — ONDANSETRON HCL 4 MG/2ML IJ SOLN
INTRAMUSCULAR | Status: AC
  Filled 2020-06-24: qty 2

## 2020-06-24 MED ORDER — ONDANSETRON HCL 4 MG/2ML IJ SOLN
INTRAMUSCULAR | Status: DC | PRN
  Administered 2020-06-24: 4 mg via INTRAVENOUS

## 2020-06-24 MED ORDER — FENTANYL CITRATE (PF) 100 MCG/2ML IJ SOLN
INTRAMUSCULAR | Status: AC
  Filled 2020-06-24: qty 2

## 2020-06-24 MED ORDER — SODIUM CHLORIDE 0.9 % IR SOLN
Status: DC | PRN
  Administered 2020-06-24: 2500 mL via INTRAVESICAL

## 2020-06-24 MED ORDER — LIDOCAINE 2% (20 MG/ML) 5 ML SYRINGE
INTRAMUSCULAR | Status: DC | PRN
  Administered 2020-06-24: 60 mg via INTRAVENOUS

## 2020-06-24 MED ORDER — PROPOFOL 10 MG/ML IV BOLUS
INTRAVENOUS | Status: DC | PRN
  Administered 2020-06-24: 120 mg via INTRAVENOUS

## 2020-06-24 MED ORDER — LIDOCAINE 2% (20 MG/ML) 5 ML SYRINGE
INTRAMUSCULAR | Status: AC
  Filled 2020-06-24: qty 5

## 2020-06-24 MED ORDER — CEFAZOLIN SODIUM-DEXTROSE 2-4 GM/100ML-% IV SOLN
INTRAVENOUS | Status: AC
  Filled 2020-06-24: qty 100

## 2020-06-24 MED ORDER — ACETAMINOPHEN 10 MG/ML IV SOLN: 1000.0000 mg | Freq: Once | INTRAVENOUS | Status: DC | PRN

## 2020-06-24 MED ORDER — FENTANYL CITRATE (PF) 100 MCG/2ML IJ SOLN
INTRAMUSCULAR | Status: DC | PRN
  Administered 2020-06-24: 50 ug via INTRAVENOUS

## 2020-06-24 MED ORDER — LACTATED RINGERS IV SOLN: INTRAVENOUS | Status: DC

## 2020-06-24 MED ORDER — PROPOFOL 10 MG/ML IV BOLUS
INTRAVENOUS | Status: AC
  Filled 2020-06-24: qty 20

## 2020-06-24 SURGICAL SUPPLY — 28 items
BAG DRAIN URO-CYSTO SKYTR STRL (DRAIN) ×2 IMPLANT
BAG DRN RND TRDRP ANRFLXCHMBR (UROLOGICAL SUPPLIES) ×1
BAG DRN UROCATH (DRAIN) ×1
BAG URINE DRAIN 2000ML AR STRL (UROLOGICAL SUPPLIES) ×1 IMPLANT
BAG URINE LEG 500ML (DRAIN) IMPLANT
CATH FOLEY 2WAY SLVR  5CC 18FR (CATHETERS) ×2
CATH FOLEY 2WAY SLVR  5CC 22FR (CATHETERS)
CATH FOLEY 2WAY SLVR 30CC 20FR (CATHETERS) IMPLANT
CATH FOLEY 2WAY SLVR 5CC 18FR (CATHETERS) IMPLANT
CATH FOLEY 2WAY SLVR 5CC 22FR (CATHETERS) IMPLANT
CLOTH BEACON ORANGE TIMEOUT ST (SAFETY) ×2 IMPLANT
ELECT REM PT RETURN 9FT ADLT (ELECTROSURGICAL) ×2
ELECTRODE REM PT RTRN 9FT ADLT (ELECTROSURGICAL) IMPLANT
EVACUATOR MICROVAS BLADDER (UROLOGICAL SUPPLIES) IMPLANT
GLOVE BIO SURGEON STRL SZ7.5 (GLOVE) ×2 IMPLANT
GOWN STRL REUS W/ TWL LRG LVL3 (GOWN DISPOSABLE) ×1 IMPLANT
GOWN STRL REUS W/TWL LRG LVL3 (GOWN DISPOSABLE) ×4
HOLDER FOLEY CATH W/STRAP (MISCELLANEOUS) ×1 IMPLANT
IV NS IRRIG 3000ML ARTHROMATIC (IV SOLUTION) ×2 IMPLANT
KIT TURNOVER CYSTO (KITS) ×2 IMPLANT
LOOP CUT BIPOLAR 24F LRG (ELECTROSURGICAL) ×1 IMPLANT
MANIFOLD NEPTUNE II (INSTRUMENTS) ×2 IMPLANT
PACK CYSTO (CUSTOM PROCEDURE TRAY) ×2 IMPLANT
PLUG CATH AND CAP STER (CATHETERS) ×1 IMPLANT
SYR TOOMEY IRRIG 70ML (MISCELLANEOUS)
SYRINGE TOOMEY IRRIG 70ML (MISCELLANEOUS) IMPLANT
TUBE CONNECTING 12X1/4 (SUCTIONS) ×1 IMPLANT
WATER STERILE IRR 500ML POUR (IV SOLUTION) ×1 IMPLANT

## 2020-06-24 NOTE — Interval H&P Note (Signed)
History and Physical Interval Note:  06/24/2020 8:18 AM  Jeremy Day  has presented today for surgery, with the diagnosis of BLADDER TUMOR.  The various methods of treatment have been discussed with the patient and family. After consideration of risks, benefits and other options for treatment, the patient has consented to  Procedure(s): TRANSURETHRAL RESECTION OF BLADDER TUMOR WITH GEMCITABINE VERSES BLADDER BIOPSY (N/A) as a surgical intervention.  The patient's history has been reviewed, patient examined, no change in status, stable for surgery.  I have reviewed the patient's chart and labs.  Questions were answered to the patient's satisfaction.     Marton Redwood, III

## 2020-06-24 NOTE — Anesthesia Postprocedure Evaluation (Signed)
Anesthesia Post Note  Patient: Jeremy Day  Procedure(s) Performed: TRANSURETHRAL RESECTION OF BLADDER TUMOR WITH POST OPERATIVE GEMCITABINE INSTILLATION (N/A Bladder)     Patient location during evaluation: PACU Anesthesia Type: General Level of consciousness: awake and alert Pain management: pain level controlled Vital Signs Assessment: post-procedure vital signs reviewed and stable Respiratory status: spontaneous breathing, nonlabored ventilation, respiratory function stable and patient connected to nasal cannula oxygen Cardiovascular status: blood pressure returned to baseline and stable Postop Assessment: no apparent nausea or vomiting Anesthetic complications: no   No complications documented.  Last Vitals:  Vitals:   06/24/20 1030 06/24/20 1045  BP: 137/89 140/85  Pulse: (!) 49 (!) 48  Resp: 13 15  Temp:    SpO2: 92% 98%    Last Pain:  Vitals:   06/24/20 1045  TempSrc:   PainSc: [place]

## 2020-06-24 NOTE — Anesthesia Procedure Notes (Signed)
Procedure Name: LMA Insertion Date/Time: 06/24/2020 8:29 AM Performed by: Niel Hummer, CRNA Pre-anesthesia Checklist: Patient identified, Emergency Drugs available, Suction available and Patient being monitored Patient Re-evaluated:Patient Re-evaluated prior to induction Oxygen Delivery Method: Circle system utilized Preoxygenation: Pre-oxygenation with 100% oxygen Induction Type: IV induction LMA: LMA inserted LMA Size: 4.0 Number of attempts: 1 Dental Injury: Teeth and Oropharynx as per pre-operative assessment

## 2020-06-24 NOTE — Discharge Instructions (Addendum)
Transurethral Resection of Bladder Tumor (TURBT) or Bladder Biopsy   Definition:  Transurethral Resection of the Bladder Tumor is a surgical procedure used to diagnose and remove tumors within the bladder. TURBT is the most common treatment for early stage bladder cancer.  General instructions:     Your recent bladder surgery requires very little post hospital care but some definite precautions.  Despite the fact that no skin incisions were used, the area around the bladder incisions are raw and covered with scabs to promote healing and prevent bleeding. Certain precautions are needed to insure that the scabs are not disturbed over the next 2-4 weeks while the healing proceeds.  Because the raw surface inside your bladder and the irritating effects of urine you may expect frequency of urination and/or urgency (a stronger desire to urinate) and perhaps even getting up at night more often. This will usually resolve or improve slowly over the healing period. You may see some blood in your urine over the first 6 weeks. Do not be alarmed, even if the urine was clear for a while. Get off your feet and drink lots of fluids until clearing occurs. If you start to pass clots or don't improve call us.  Diet:  You may return to your normal diet immediately. Because of the raw surface of your bladder, alcohol, spicy foods, foods high in acid and drinks with caffeine may cause irritation or frequency and should be used in moderation. To keep your urine flowing freely and avoid constipation, drink plenty of fluids during the day (8-10 glasses). Tip: Avoid cranberry juice because it is very acidic.  Activity:  Your physical activity doesn't need to be restricted. However, if you are very active, you may see some blood in the urine. We suggest that you reduce your activity under the circumstances until the bleeding has stopped.  Bowels:  It is important to keep your bowels regular during the postoperative  period. Straining with bowel movements can cause bleeding. A bowel movement every other day is reasonable. Use a mild laxative if needed, such as milk of magnesia 2-3 tablespoons, or 2 Dulcolax tablets. Call if you continue to have problems. If you had been taking narcotics for pain, before, during or after your surgery, you may be constipated. Take a laxative if necessary.    Medication:  You should resume your pre-surgery medications unless told not to. In addition you may be given an antibiotic to prevent or treat infection. Antibiotics are not always necessary. All medication should be taken as prescribed until the bottles are finished unless you are having an unusual reaction to one of the drugs.   Post Anesthesia Home Care Instructions  Activity: Get plenty of rest for the remainder of the day. A responsible individual must stay with you for 24 hours following the procedure.  For the next 24 hours, DO NOT: -Drive a car -Operate machinery -Drink alcoholic beverages -Take any medication unless instructed by your physician -Make any legal decisions or sign important papers.  Meals: Start with liquid foods such as gelatin or soup. Progress to regular foods as tolerated. Avoid greasy, spicy, heavy foods. If nausea and/or vomiting occur, drink only clear liquids until the nausea and/or vomiting subsides. Call your physician if vomiting continues.  Special Instructions/Symptoms: Your throat may feel dry or sore from the anesthesia or the breathing tube placed in your throat during surgery. If this causes discomfort, gargle with warm salt water. The discomfort should disappear within 24 hours.       

## 2020-06-24 NOTE — H&P (Signed)
H&P  Chief Complaint: Bladder tumor  History of Present Illness: 69 year old male with a history of bladder cancer was found to have some bladder erythema with raised mucosa and presents for bladder biopsy versus TURBT with instillation of gemcitabine.  Past Medical History:  Diagnosis Date  . Abnormal tandem walk    using 2 canes due to lower back arthritis  . Ascending aorta dilatation (HCC)    echo  04-11-2016  51mm and dilated aortc root 28mm  . Bladder tumor   . BPH (benign prostatic hyperplasia)   . Cancer ([place]) 2020   bladder  . Depression   . Diverticulosis   . Full dentures   . GERD (gastroesophageal reflux disease)   . Hiatal hernia   . History of colon polyps   . History of staphylococcal infection 2008   right ankle  w/ sepsis  . OA (osteoarthritis)    lower back, right ankle,, bilateral knees and shouldres  . Pneumonia yrs ago  . Pre-diabetes   . Psoriasis   . PVC's (premature ventricular contractions)    2012-- hx bigeminy/ trigeminy with near syncope (cardiology consult note in epic dated 2012)  (last cardiology visit in epic w/ dr Marlou Porch, 03-27-2016)  . Seasonal allergies   . Urgency of urination    Past Surgical History:  Procedure Laterality Date  . ANKLE ARTHROSCOPY Right 02-13-2007    dr [name]   w/ debridement and removal loose body  . COLONOSCOPY  last one summer 2019  . INGUINAL HERNIA REPAIR Right 1980s  . INGUINAL LYMPH NODE BIOPSY  2001  approx.   benign  . TRANSURETHRAL RESECTION OF BLADDER TUMOR N/A 02/06/2019   Procedure: TRANSURETHRAL RESECTION OF BLADDER TUMOR (TURBT);  Surgeon: Lucas Mallow, MD;  Location: [facility];  Service: Urology;  Laterality: N/A;  . TRANSURETHRAL RESECTION OF BLADDER TUMOR N/A 02/20/2019   Procedure: TRANSURETHRAL RESECTION OF BLADDER TUMOR (TURBT);  Surgeon: Lucas Mallow, MD;  Location: [facility];  Service: Urology;  Laterality: N/A;  . TRANSURETHRAL RESECTION OF BLADDER TUMOR N/A 03/13/2019   Procedure:  TRANSURETHRAL RESECTION OF BLADDER TUMOR (TURBT);  Surgeon: Lucas Mallow, MD;  Location: [facility];  Service: Urology;  Laterality: N/A;  . TRANSURETHRAL RESECTION OF BLADDER TUMOR WITH MITOMYCIN-C N/A 09/23/2019   Procedure: TRANSURETHRAL RESECTION OF BLADDER TUMOR WITH GEMCITABINE INSTILLATION INTO THE BLADDER;  Surgeon: Lucas Mallow, MD;  Location[facility];  Service: Urology;  Laterality: N/A;    Home Medications:  Medications Prior to Admission  Medication Sig Dispense Refill Last Dose  . acetaminophen (TYLENOL) 500 MG tablet Take 1,000 mg by mouth every 6 (six) hours as needed (pain.).   Past Month at Unknown time  . calcium carbonate (TUMS - DOSED IN MG ELEMENTAL CALCIUM) 500 MG chewable tablet Chew 1 tablet by mouth daily as needed for indigestion or heartburn.   06/23/2020 at Unknown time  . diclofenac (VOLTAREN) 75 MG EC tablet Take 75 mg by mouth daily.   Past Week at Unknown time  . escitalopram (LEXAPRO) 10 MG tablet Take 10 mg by mouth every evening. Takes 1 10 mg and 1/2 of 10 mg dose is 15 mg    Past Week at Unknown time  . famotidine (PEPCID) 20 MG tablet Take 20 mg by mouth every morning.    Past Week at Unknown time  . pimecrolimus (ELIDEL) 1 % cream Apply 1 application topically daily as needed (psoriasis).    Past Week  at Unknown time  . Polyethyl Glycol-Propyl Glycol (SYSTANE OP) Place 1 drop into both eyes 2 (two) times daily as needed (dry eyes).   06/24/2020 at 0500  . clobetasol cream (TEMOVATE) 7.82 % Apply 1 application topically 2 (two) times daily as needed (psoriasis).    More than a month at Unknown time  . diphenhydrAMINE (BENADRYL) 25 MG tablet Take 25 mg by mouth every 6 (six) hours as needed for allergies.   More than a month at Unknown time  . HYDROcodone-acetaminophen (NORCO/VICODIN) 5-325 MG tablet Take 1 tablet by mouth every 4 (four) hours as needed for moderate pain. 6 tablet 0   . mometasone (NASONEX) 50 MCG/ACT nasal spray Place 2  sprays into the nose daily as needed (allergies.).    More than a month at Unknown time   Allergies: No Known Allergies  Family History  Problem Relation Age of Onset  . Other Father        low potassium    Social History:  reports that he quit smoking about 35 years ago. His smoking use included cigarettes. He has a 40.00 pack-year smoking history. He quit smokeless tobacco use about 23 years ago.  His smokeless tobacco use included snuff and chew. He reports previous drug use. He reports that he does not drink alcohol.  ROS: A complete review of systems was performed.  All systems are negative except for pertinent findings as noted. ROS   Physical Exam:  Vital signs in last 24 hours: Temp:  [98.3 F (36.8 C)] 98.3 F (36.8 C) (08/18 0724) Pulse Rate:  [59] 59 (08/18 0724) Resp:  [15] 15 (08/18 0724) BP: (186)/(82) 186/82 (08/18 0724) SpO2:  [95 %] 95 % (08/18 0724) Weight:  [84.7 kg] 84.7 kg (08/18 0724) General:  Alert and oriented, No acute distress HEENT: Normocephalic, atraumatic Neck: No JVD or lymphadenopathy Cardiovascular: Regular rate and rhythm Lungs: Regular rate and effort Abdomen: Soft, nontender, nondistended, no abdominal masses Back: No CVA tenderness Extremities: No edema Neurologic: Grossly intact  Laboratory Data:  Results for orders placed or performed during the hospital encounter of 06/24/20 (from the past 24 hour(s))  I-STAT, chem 8     Status: Abnormal   Collection Time: 06/24/20  7:44 AM  Result Value Ref Range   Sodium 141 135 - 145 mmol/L   Potassium 3.7 3.5 - 5.1 mmol/L   Chloride 99 98 - 111 mmol/L   BUN 9 8 - 23 mg/dL   Creatinine, Ser 0.80 0.61 - 1.24 mg/dL   Glucose, Bld 100 (H) 70 - 99 mg/dL   Calcium, Ion 1.21 1.15 - 1.40 mmol/L   TCO2 27 22 - 32 mmol/L   Hemoglobin 16.0 13.0 - 17.0 g/dL   HCT 47.0 39 - 52 %   Recent Results (from the past 240 hour(s))  SARS CORONAVIRUS 2 (TAT 6-24 HRS) Nasopharyngeal Nasopharyngeal Swab      Status: None   Collection Time: 06/20/20  2:40 PM   Specimen: Nasopharyngeal Swab  Result Value Ref Range Status   SARS Coronavirus 2 NEGATIVE NEGATIVE Final    Comment: (NOTE) SARS-CoV-2 target nucleic acids are NOT DETECTED.  The SARS-CoV-2 RNA is generally detectable in upper and lower respiratory specimens during the acute phase of infection. Negative results do not preclude SARS-CoV-2 infection, do not rule out co-infections with other pathogens, and should not be used as the sole basis for treatment or other patient management decisions. Negative results must be combined with clinical observations, patient history,  and epidemiological information. The expected result is Negative.  Fact Sheet for Patients: SugarRoll.be  Fact Sheet for Healthcare Providers: https://www.woods-mathews.com/  This test is not yet approved or cleared by the [place] FDA and  has been authorized for detection and/or diagnosis of SARS-CoV-2 by FDA under an Emergency Use Authorization (EUA). This EUA will remain  in effect (meaning this test can be used) for the duration of the COVID-19 declaration under Se ction 564(b)(1) of the Act, 21 U.S.C. section 360bbb-3(b)(1), unless the authorization is terminated or revoked sooner.  Performed at [facility], [place] [address], [place], [place] [address]    Creatinine: Recent Labs    06/24/20 0744  CREATININE 0.80    Impression/Assessment:  Bladder tumor with history of bladder cancer  Plan:  Proceed with bladder biopsy and fulguration versus TURBT  Marton Redwood, III 06/24/2020, 8:16 AM

## 2020-06-24 NOTE — Transfer of Care (Signed)
Immediate Anesthesia Transfer of Care Note  Patient: Jeremy Day  Procedure(s) Performed: TRANSURETHRAL RESECTION OF BLADDER TUMOR WITH GEMCITABINE VERSES BLADDER BIOPSY (N/A Bladder)  Patient Location: PACU  Anesthesia Type:General  Level of Consciousness: awake, alert  and oriented  Airway & Oxygen Therapy: Patient Spontanous Breathing and Patient connected to face mask oxygen  Post-op Assessment: Report given to RN, Post -op Vital signs reviewed and stable and Patient moving all extremities X 4  Post vital signs: Reviewed and stable  Last Vitals:  Vitals Value Taken Time  BP    Temp    Pulse 49 06/24/20 0857  Resp 12 06/24/20 0857  SpO2 98 % 06/24/20 0857  Vitals shown include unvalidated device data.  Last Pain:  Vitals:   06/24/20 0724  TempSrc: Oral      Patients Stated Pain Goal: 5 (71/59/53 9672)  Complications: No complications documented.

## 2020-06-24 NOTE — Op Note (Signed)
Operative Note  Preoperative diagnosis:  1.  Bladder tumor with a history of bladder cancer  Postoperative diagnosis: 1.  Bladder tumor with a history of bladder cancer--small  Procedure(s): 1.  Transurethral resection of bladder tumor--small 2.  Intravesical instillation of gemcitabine  Surgeon: Link Snuffer, MD  Assistants: None  Anesthesia: General  Complications: None immediate  EBL: Minimal  Specimens: 1.  Bladder tumor  Drains/Catheters: 1.  18 French Foley catheter  Intraoperative findings: 1.  Normal urethra 2.  Bladder mucosa with several prior TUR scars.  The scar on the posterior bladder wall had an area of raised erythema cannot rule out CIS versus superficial tumor.  Appeared completely resected superficially.  Bilateral ureteral orifices were normal and well away from resection.  Indication: 69 year old male with a history of high-grade T1 bladder cancer was found to have some erythema on the posterior bladder wall and presents for the previously mentioned operation.  Description of procedure:  The patient was identified and consent was obtained.  The patient was taken to the operating room and placed in the supine position.  The patient was placed under general anesthesia.  Perioperative antibiotics were administered.  The patient was placed in dorsal lithotomy.  Patient was prepped and draped in a standard sterile fashion and a timeout was performed.  A 26 French resectoscope with a visual obturator in place was advanced into the urethra and into the bladder.  Complete cystoscopy was performed with findings noted above.  I exchanged for the working element.  On bipolar settings, I resected the area of interest.  I fulgurated the resection bed.  Also fulgurated anteriorly over the another prior TUR scar that had some mild erythema there.  I inspected the entire bladder mucosa and there were no other concerning areas.  I withdrew the scope and placed a Foley catheter.   This concluded the operation.  Patient tolerated procedure well and was stable postoperative.  In the PACU, I instilled intravesical gemcitabine into the bladder which remained for proxy 1 hour prior to proper disposal.  Plan: Return in 1 week for pathology review.

## 2020-06-25 ENCOUNTER — Encounter (HOSPITAL_BASED_OUTPATIENT_CLINIC_OR_DEPARTMENT_OTHER): Payer: Self-pay | Admitting: Urology

## 2020-06-25 LAB — SURGICAL PATHOLOGY

## 2020-07-01 ENCOUNTER — Ambulatory Visit: Payer: PPO | Attending: Neurological Surgery | Admitting: Physical Therapy

## 2020-07-01 ENCOUNTER — Other Ambulatory Visit: Payer: Self-pay

## 2020-07-01 ENCOUNTER — Encounter: Payer: Self-pay | Admitting: Physical Therapy

## 2020-07-01 DIAGNOSIS — R2689 Other abnormalities of gait and mobility: Secondary | ICD-10-CM | POA: Diagnosis not present

## 2020-07-01 DIAGNOSIS — M5442 Lumbago with sciatica, left side: Secondary | ICD-10-CM | POA: Insufficient documentation

## 2020-07-01 DIAGNOSIS — G8929 Other chronic pain: Secondary | ICD-10-CM | POA: Diagnosis not present

## 2020-07-01 DIAGNOSIS — R293 Abnormal posture: Secondary | ICD-10-CM | POA: Diagnosis not present

## 2020-07-01 DIAGNOSIS — M5441 Lumbago with sciatica, right side: Secondary | ICD-10-CM | POA: Insufficient documentation

## 2020-07-01 NOTE — Therapy (Signed)
[facility] [place], [place], [address] Phone: [phone]   Fax:  [phone]  Physical Therapy Evaluation  Patient Details  Name: Jeremy Day MRN: [id number] Date of Birth: 07-16-1951 Referring Provider (PT): Margarita Mail MD.   Encounter Date: 07/01/2020   PT End of Session - 07/01/20 0853    Visit Number 1    Number of Visits 12    Date for PT Re-Evaluation 08/12/20    PT Start Time 0805    PT Stop Time 0838    PT Time Calculation (min) 33 min    Activity Tolerance Patient tolerated treatment well    Behavior During Therapy [facility] for tasks assessed/performed           Past Medical History:  Diagnosis Date  . Abnormal tandem walk    using 2 canes due to lower back arthritis  . Ascending aorta dilatation (HCC)    echo  04-11-2016  44mm and dilated aortc root 50mm  . Bladder tumor   . BPH (benign prostatic hyperplasia)   . Cancer ([place]) 2020   bladder  . Depression   . Diverticulosis   . Full dentures   . GERD (gastroesophageal reflux disease)   . Hiatal hernia   . History of colon polyps   . History of staphylococcal infection 2008   right ankle  w/ sepsis  . OA (osteoarthritis)    lower back, right ankle,, bilateral knees and shouldres  . Pneumonia yrs ago  . Pre-diabetes   . Psoriasis   . PVC's (premature ventricular contractions)    2012-- hx bigeminy/ trigeminy with near syncope (cardiology consult note in epic dated 2012)  (last cardiology visit in epic w/ dr Marlou Porch, 03-27-2016)  . Seasonal allergies   . Urgency of urination     Past Surgical History:  Procedure Laterality Date  . ANKLE ARTHROSCOPY Right 02-13-2007    dr [name]   w/ debridement and removal loose body  . COLONOSCOPY  last one summer 2019  . INGUINAL HERNIA REPAIR Right 1980s  . INGUINAL LYMPH NODE BIOPSY  2001  approx.   benign  . TRANSURETHRAL RESECTION OF BLADDER TUMOR N/A 02/06/2019   Procedure: TRANSURETHRAL RESECTION OF  BLADDER TUMOR (TURBT);  Surgeon: Lucas Mallow, MD;  Location: [facility];  Service: Urology;  Laterality: N/A;  . TRANSURETHRAL RESECTION OF BLADDER TUMOR N/A 02/20/2019   Procedure: TRANSURETHRAL RESECTION OF BLADDER TUMOR (TURBT);  Surgeon: Lucas Mallow, MD;  Location: [facility];  Service: Urology;  Laterality: N/A;  . TRANSURETHRAL RESECTION OF BLADDER TUMOR N/A 03/13/2019   Procedure: TRANSURETHRAL RESECTION OF BLADDER TUMOR (TURBT);  Surgeon: Lucas Mallow, MD;  Location: [facility];  Service: Urology;  Laterality: N/A;  . TRANSURETHRAL RESECTION OF BLADDER TUMOR WITH MITOMYCIN-C N/A 09/23/2019   Procedure: TRANSURETHRAL RESECTION OF BLADDER TUMOR WITH GEMCITABINE INSTILLATION INTO THE BLADDER;  Surgeon: Lucas Mallow, MD;  Location: [facility];  Service: Urology;  Laterality: N/A;  . TRANSURETHRAL RESECTION OF BLADDER TUMOR WITH MITOMYCIN-C N/A 06/24/2020   Procedure: TRANSURETHRAL RESECTION OF BLADDER TUMOR WITH POST OPERATIVE GEMCITABINE INSTILLATION;  Surgeon: Lucas Mallow, MD;  Location: [facility];  Service: Urology;  Laterality: N/A;    There were no vitals filed for this visit.    Subjective Assessment - 07/01/20 1413    Subjective COVID-19 screen performed prior to patient entering clinic.  The patient presents to the clinic today  with  c/o chronic back and leg pain.  He is schedules to have spinal surgery in October of this year.  His pain ranges from a 3 to 8/10 with pain increasing with increased standing and walking.  Lying down and sitting decreases his pain.    Pertinent History Bladder cancer, right ankle surgery.    How long can you stand comfortably? Varies.    How long can you walk comfortably? Varies.    Patient Stated Goals Improve strength prior to surgery.              [facility] PT Assessment - 07/01/20 0001      Assessment   Medical Diagnosis Scoliosis.    Referring Provider (PT) Margarita Mail MD.    Onset Date/Surgical  Date --   Many years.     Precautions   Precautions Fall      Restrictions   Weight Bearing Restrictions No      Balance Screen   Has the patient fallen in the past 6 months Yes    How many times? --   4.     [place] residence      Prior Function   Level of Independence Independent      Posture/Postural Control   Posture/Postural Control Postural limitations    Postural Limitations Rounded Shoulders;Forward head;Decreased lumbar lordosis;Increased thoracic kyphosis;Flexed trunk    Posture Comments Right hip higher, bilateral hip ER and right tibial ER.  Notable thoraco-lumbar scoliosis.  Bilateral genu varum.      ROM / Strength   AROM / PROM / Strength AROM;Strength      AROM   Overall AROM Comments Lumbar extension to 15 degrees and functional lumbar flexion with UE support on plinth.  Limited bilateral hip IR and tighness in bilateral hip flexors.  Right ankle to neutral.  Normal bilateral hip flexion.      Strength   Overall Strength Comments Right ankle 4+/5, right quad/hams= 5/5, right hip flexion= 4+/5, right hip abduction= 4-/5 and right hip extension= 4-/5.  Left quad/ham strength is 5/5, left hip abduction is 4-/5 and left hip extension is 4-/5.      Palpation   Palpation comment Increased muscle tone over patient's thoracic to lumbar musculature and tender over both SIJ's.      Special Tests   Other special tests (+) Romberg test.      Ambulation/Gait   Gait Comments Ataxic gait pattern with patient using bilateral straight canes.                      Objective measurements completed on examination: See above findings.                    PT Long Term Goals - 07/01/20 1458      PT LONG TERM GOAL #1   Title Indepedent with a HEP.    Time 6    Period Weeks    Status New                  Plan - 07/01/20 1428    Clinical Impression Statement The patient presents to OPPT with  chronic spinal pain.  Patient is to undergo surgery in October of this year.  He presents with multpile postural devaitions and bilateral hip tightness and weakness.  He has diffuse spinal muscle tone over his thoracic and lumbar regions and palpable pain over both SIJ's.  His gait is axtaxic in  nature and he uses bilateral straight canes.  Patient will benefit from skilled physical therapy intervention to address deficits and pain.    Personal Factors and Comorbidities Comorbidity 1;Comorbidity 2    Examination-Activity Limitations Locomotion Level;Other    Examination-Participation Restrictions Other    Stability/Clinical Decision Making Evolving/Moderate complexity    Clinical Decision Making Moderate    Rehab Potential Fair    PT Frequency 2x / week    PT Duration 6 weeks    PT Treatment/Interventions ADLs/Self Care Home Management;Cryotherapy;Electrical Stimulation;Ultrasound;Moist Heat;Therapeutic activities;Therapeutic exercise;Manual techniques;Patient/family education;Passive range of motion    PT Next Visit Plan Comprehensive spinal/core stregthening program.  Modalites as needed.    Consulted and Agree with Plan of Care Patient           Patient will benefit from skilled therapeutic intervention in order to improve the following deficits and impairments:  Abnormal gait, Decreased activity tolerance, Decreased balance, Decreased coordination, Decreased range of motion, Decreased strength, Increased muscle spasms, Impaired tone, Postural dysfunction, Pain  Visit Diagnosis: Chronic bilateral low back pain with bilateral sciatica - Plan: PT plan of care cert/re-cert  Abnormal posture - Plan: PT plan of care cert/re-cert     Problem List Patient Active Problem List   Diagnosis Date Noted  . Bladder tumor 02/06/2019  . Arthritis of right ankle 07/15/2015  . Septic arthritis of right ankle ([place]) 07/15/2015  . MSSA (methicillin susceptible Staphylococcus aureus) infection  07/15/2015  . Ankle fracture, right 07/15/2015  . Traumatic osteoarthritis of right ankle 07/15/2015  . Dysphagia, unspecified(787.20) 04/30/2014  . Heartburn 04/30/2014  . Shoulder impingement syndrome   . GERD (gastroesophageal reflux disease)     Gabryel Files, [place] MPT 07/01/2020, 3:00 PM  [facility] [address] [place], [place], [address] Phone: [phone]   Fax:  [phone]  Name: Jeremy Day MRN: [id number] Date of Birth: January 16, 1951

## 2020-07-02 DIAGNOSIS — C678 Malignant neoplasm of overlapping sites of bladder: Secondary | ICD-10-CM | POA: Diagnosis not present

## 2020-07-03 ENCOUNTER — Encounter: Payer: Self-pay | Admitting: Physical Therapy

## 2020-07-03 ENCOUNTER — Ambulatory Visit: Payer: PPO | Admitting: Physical Therapy

## 2020-07-03 ENCOUNTER — Other Ambulatory Visit: Payer: Self-pay

## 2020-07-03 DIAGNOSIS — R293 Abnormal posture: Secondary | ICD-10-CM

## 2020-07-03 DIAGNOSIS — G8929 Other chronic pain: Secondary | ICD-10-CM

## 2020-07-03 DIAGNOSIS — M5442 Lumbago with sciatica, left side: Secondary | ICD-10-CM | POA: Diagnosis not present

## 2020-07-03 DIAGNOSIS — R2689 Other abnormalities of gait and mobility: Secondary | ICD-10-CM

## 2020-07-03 NOTE — Therapy (Signed)
[facility] [place], [place], [address] Phone: [phone]   Fax:  [phone]  Physical Therapy Treatment  Patient Details  Name: Jeremy Day MRN: [id number] Date of Birth: 1951/04/16 Referring Provider (PT): Margarita Mail MD.   Encounter Date: 07/03/2020   PT End of Session - 07/03/20 0908    Visit Number 2    Number of Visits 12    Date for PT Re-Evaluation 08/12/20    PT Start Time 0900    PT Stop Time 0940    PT Time Calculation (min) 40 min    Activity Tolerance Patient tolerated treatment well    Behavior During Therapy [facility] for tasks assessed/performed           Past Medical History:  Diagnosis Date  . Abnormal tandem walk    using 2 canes due to lower back arthritis  . Ascending aorta dilatation (HCC)    echo  04-11-2016  2mm and dilated aortc root 106mm  . Bladder tumor   . BPH (benign prostatic hyperplasia)   . Cancer ([place]) 2020   bladder  . Depression   . Diverticulosis   . Full dentures   . GERD (gastroesophageal reflux disease)   . Hiatal hernia   . History of colon polyps   . History of staphylococcal infection 2008   right ankle  w/ sepsis  . OA (osteoarthritis)    lower back, right ankle,, bilateral knees and shouldres  . Pneumonia yrs ago  . Pre-diabetes   . Psoriasis   . PVC's (premature ventricular contractions)    2012-- hx bigeminy/ trigeminy with near syncope (cardiology consult note in epic dated 2012)  (last cardiology visit in epic w/ dr Marlou Porch, 03-27-2016)  . Seasonal allergies   . Urgency of urination     Past Surgical History:  Procedure Laterality Date  . ANKLE ARTHROSCOPY Right 02-13-2007    dr [name]   w/ debridement and removal loose body  . COLONOSCOPY  last one summer 2019  . INGUINAL HERNIA REPAIR Right 1980s  . INGUINAL LYMPH NODE BIOPSY  2001  approx.   benign  . TRANSURETHRAL RESECTION OF BLADDER TUMOR N/A 02/06/2019   Procedure: TRANSURETHRAL RESECTION OF BLADDER  TUMOR (TURBT);  Surgeon: Lucas Mallow, MD;  Location: [facility];  Service: Urology;  Laterality: N/A;  . TRANSURETHRAL RESECTION OF BLADDER TUMOR N/A 02/20/2019   Procedure: TRANSURETHRAL RESECTION OF BLADDER TUMOR (TURBT);  Surgeon: Lucas Mallow, MD;  [facility]: [facility];  Service: Urology;  Laterality: N/A;  . TRANSURETHRAL RESECTION OF BLADDER TUMOR N/A 03/13/2019   Procedure: TRANSURETHRAL RESECTION OF BLADDER TUMOR (TURBT);  Surgeon: Lucas Mallow, MD;  [facility];  Service: Urology;  Laterality: N/A;  . TRANSURETHRAL RESECTION OF BLADDER TUMOR WITH MITOMYCIN-C N/A 09/23/2019   Procedure: TRANSURETHRAL RESECTION OF BLADDER TUMOR WITH GEMCITABINE INSTILLATION INTO THE BLADDER;  Surgeon: Lucas Mallow, MD;  Location: [facility];  Service: Urology;  Laterality: N/A;  . TRANSURETHRAL RESECTION OF BLADDER TUMOR WITH MITOMYCIN-C N/A 06/24/2020   Procedure: TRANSURETHRAL RESECTION OF BLADDER TUMOR WITH POST OPERATIVE GEMCITABINE INSTILLATION;  Surgeon: Lucas Mallow, MD;  Location: [place];  Service: Urology;  Laterality: N/A;    There were no vitals filed for this visit.   Subjective Assessment - 07/03/20 0858    Subjective Covid-19 screen performed prior to pt entering clinic. Pt presents with no pain upon arrival.    Pertinent  History Bladder cancer, right ankle surgery.    How long can you stand comfortably? Varies.    How long can you walk comfortably? Varies.    Patient Stated Goals Improve strength prior to surgery.    Currently in Pain? No/denies                             [facility] Adult PT Treatment/Exercise - 07/03/20 0001      Ambulation/Gait   Gait Comments Ataxic gait pattern with patient using bilateral straight canes.      Exercises   Exercises Lumbar      Lumbar Exercises: Aerobic   Nustep L3 x 6 minutes, 1 lap completed      Lumbar Exercises: Supine   Ab Set 10 reps;3 seconds    Bridge 10  reps;2 seconds    Bridge Limitations bridge with lower leg on red physioball 2 x 5 reps with CGA for stabilization    Straight Leg Raise 5 reps;2 seconds;Limitations    Straight Leg Raises Limitations 3 sets    Other Supine Lumbar Exercises ball squeezes x 15 holding 5 seconds    Other Supine Lumbar Exercises hamstring sets with legs propped on black bolster pillow 3 x 5 reps, glutes sets x 10 holding 5 seconds      Lumbar Exercises: Sidelying   Clam Both;5 reps;Limitations    Clam Limitations 3 rets                       PT Long Term Goals - 07/03/20 5053      PT LONG TERM GOAL #1   Title Indepedent with a HEP.    Time 6    Period Weeks    Status On-going                 Plan - 07/03/20 9767    Clinical Impression Statement Pt arriving to therapy reporting no pain. Pt performing exercises with verbal and tactile cures for technique. Pt with limited tolerance due to increase in pain in low back ranging from 4-6. Periodic short rest breaks thourghout session. Concentration on core stability and strengthening.    Personal Factors and Comorbidities Comorbidity 1;Comorbidity 2    Examination-Activity Limitations Locomotion Level;Other    Stability/Clinical Decision Making Evolving/Moderate complexity    Rehab Potential Fair    PT Frequency 2x / week    PT Duration 6 weeks    PT Treatment/Interventions ADLs/Self Care Home Management;Cryotherapy;Electrical Stimulation;Ultrasound;Moist Heat;Therapeutic activities;Therapeutic exercise;Manual techniques;Patient/family education;Passive range of motion    PT Next Visit Plan Comprehensive spinal/core stregthening program.  Modalites as needed.    Consulted and Agree with Plan of Care Patient           Patient will benefit from skilled therapeutic intervention in order to improve the following deficits and impairments:  Abnormal gait, Decreased activity tolerance, Decreased balance, Decreased coordination, Decreased  range of motion, Decreased strength, Increased muscle spasms, Impaired tone, Postural dysfunction, Pain  Visit Diagnosis: Chronic bilateral low back pain with bilateral sciatica  Abnormal posture  Other abnormalities of gait and mobility     Problem List Patient Active Problem List   Diagnosis Date Noted  . Bladder tumor 02/06/2019  . Arthritis of right ankle 07/15/2015  . Septic arthritis of right ankle ([place]) 07/15/2015  . MSSA (methicillin susceptible Staphylococcus aureus) infection 07/15/2015  . Ankle fracture, right 07/15/2015  . Traumatic osteoarthritis of right ankle 07/15/2015  .  Dysphagia, unspecified(787.20) 04/30/2014  . Heartburn 04/30/2014  . Shoulder impingement syndrome   . GERD (gastroesophageal reflux disease)     Oretha Caprice, PT, MPT 07/03/2020, 9:51 AM  [facility] [address] [place], [place], [address] Phone: [phone]   Fax:  [phone]  Name: Jeremy Day MRN: [id number] Date of Birth: August 28, 1951

## 2020-07-07 ENCOUNTER — Ambulatory Visit: Payer: PPO | Admitting: Physical Therapy

## 2020-07-07 ENCOUNTER — Encounter: Payer: Self-pay | Admitting: Physical Therapy

## 2020-07-07 ENCOUNTER — Other Ambulatory Visit: Payer: Self-pay

## 2020-07-07 DIAGNOSIS — R293 Abnormal posture: Secondary | ICD-10-CM

## 2020-07-07 DIAGNOSIS — R2689 Other abnormalities of gait and mobility: Secondary | ICD-10-CM

## 2020-07-07 DIAGNOSIS — M5442 Lumbago with sciatica, left side: Secondary | ICD-10-CM | POA: Diagnosis not present

## 2020-07-07 DIAGNOSIS — M5441 Lumbago with sciatica, right side: Secondary | ICD-10-CM

## 2020-07-07 DIAGNOSIS — G8929 Other chronic pain: Secondary | ICD-10-CM

## 2020-07-07 NOTE — Therapy (Signed)
[facility] [place], [place], [address] Phone: [phone]   Fax:  [phone]  Physical Therapy Treatment  Patient Details  Name: Jeremy Day MRN: [id number] Date of Birth: 09/23/1951 Referring Provider (PT): Margarita Mail MD.   Encounter Date: 07/07/2020   PT End of Session - 07/07/20 0903    Visit Number 3    Number of Visits 12    Date for PT Re-Evaluation 08/12/20    PT Start Time 0902    PT Stop Time 0944    PT Time Calculation (min) 42 min    Activity Tolerance Patient tolerated treatment well    Behavior During Therapy [facility] for tasks assessed/performed           Past Medical History:  Diagnosis Date  . Abnormal tandem walk    using 2 canes due to lower back arthritis  . Ascending aorta dilatation (HCC)    echo  04-11-2016  42mm and dilated aortc root 28mm  . Bladder tumor   . BPH (benign prostatic hyperplasia)   . Cancer ([place]) 2020   bladder  . Depression   . Diverticulosis   . Full dentures   . GERD (gastroesophageal reflux disease)   . Hiatal hernia   . History of colon polyps   . History of staphylococcal infection 2008   right ankle  w/ sepsis  . OA (osteoarthritis)    lower back, right ankle,, bilateral knees and shouldres  . Pneumonia yrs ago  . Pre-diabetes   . Psoriasis   . PVC's (premature ventricular contractions)    2012-- hx bigeminy/ trigeminy with near syncope (cardiology consult note in epic dated 2012)  (last cardiology visit in epic w/ dr Marlou Porch, 03-27-2016)  . Seasonal allergies   . Urgency of urination     Past Surgical History:  Procedure Laterality Date  . ANKLE ARTHROSCOPY Right 02-13-2007    dr [name]   w/ debridement and removal loose body  . COLONOSCOPY  last one summer 2019  . INGUINAL HERNIA REPAIR Right 1980s  . INGUINAL LYMPH NODE BIOPSY  2001  approx.   benign  . TRANSURETHRAL RESECTION OF BLADDER TUMOR N/A 02/06/2019   Procedure: TRANSURETHRAL RESECTION OF BLADDER  TUMOR (TURBT);  Surgeon: Lucas Mallow, MD;  [facility];  Service: Urology;  Laterality: N/A;  . TRANSURETHRAL RESECTION OF BLADDER TUMOR N/A 02/20/2019   Procedure: TRANSURETHRAL RESECTION OF BLADDER TUMOR (TURBT);  Surgeon: Lucas Mallow, MD;  [facility];  Service: Urology;  Laterality: N/A;  . TRANSURETHRAL RESECTION OF BLADDER TUMOR N/A 03/13/2019   Procedure: TRANSURETHRAL RESECTION OF BLADDER TUMOR (TURBT);  Surgeon: Lucas Mallow, MD;  [facility];  Service: Urology;  Laterality: N/A;  . TRANSURETHRAL RESECTION OF BLADDER TUMOR WITH MITOMYCIN-C N/A 09/23/2019   Procedure: TRANSURETHRAL RESECTION OF BLADDER TUMOR WITH GEMCITABINE INSTILLATION INTO THE BLADDER;  Surgeon: Lucas Mallow, MD;  Location: [facility];  Service: Urology;  Laterality: N/A;  . TRANSURETHRAL RESECTION OF BLADDER TUMOR WITH MITOMYCIN-C N/A 06/24/2020   Procedure: TRANSURETHRAL RESECTION OF BLADDER TUMOR WITH POST OPERATIVE GEMCITABINE INSTILLATION;  Surgeon: Lucas Mallow, MD;  Location: [place];  Service: Urology;  Laterality: N/A;    There were no vitals filed for this visit.   Subjective Assessment - 07/07/20 0902    Subjective Covid-19 screen performed prior to pt entering clinic. Pt presents with reports of nusance pain.    Pertinent  History Bladder cancer, right ankle surgery.    How long can you stand comfortably? Varies.    How long can you walk comfortably? Varies.    Patient Stated Goals Improve strength prior to surgery.    Currently in Pain? No/denies              [facility] PT Assessment - 07/07/20 0001      Assessment   Medical Diagnosis Scoliosis.    Referring Provider (PT) Margarita Mail MD.    Next MD Visit 07/16/2020      Precautions   Precautions Fall      Restrictions   Weight Bearing Restrictions No                         [facility] Adult PT Treatment/Exercise - 07/07/20 0001      Exercises   Exercises  Lumbar;Knee/Hip      Lumbar Exercises: Aerobic   Nustep L4 x5 min      Lumbar Exercises: Supine   Ab Set 15 reps;5 seconds    Glut Set 15 reps;5 seconds    Clam 20 reps;3 seconds    Clam Limitations red theraband    Bridge 15 reps;5 seconds    Bridge Limitations bridge with LEs on blue ball x15 reps 3 sec holds    Straight Leg Raise 20 reps;2 seconds    Isometric Hip Flexion 20 reps;3 seconds    Other Supine Lumbar Exercises HS BLE x15 reps 5 sec      Lumbar Exercises: Sidelying   Clam Both;5 reps;Limitations    Clam Limitations 3 rets      Lumbar Exercises: Prone   Straight Leg Raise 15 reps;3 seconds    Straight Leg Raises Limitations half range due to weakness      Knee/Hip Exercises: Seated   Long Arc Quad Strengthening;Both;15 reps;Weights    Long Arc Quad Weight 3 lbs.    Hamstring Curl Strengthening;Both;15 reps;Limitations    Hamstring Limitations red theraband      Knee/Hip Exercises: Supine   Hip Adduction Isometric Strengthening;Both;15 reps    Hip Adduction Isometric Limitations 5 sec hold                       PT Long Term Goals - 07/03/20 0924      PT LONG TERM GOAL #1   Title Indepedent with a HEP.    Time 6    Period Weeks    Status On-going                 Plan - 07/07/20 3785    Clinical Impression Statement Patient presented in clinic with reports of LE weakness and his "normal" LBP. Patient guided through core as well as LE strengthening with only reports of fatigue. Patient demonstrated extreme weakness in prone for B hip extension and for SL clams especially for RLE.    Personal Factors and Comorbidities Comorbidity 1;Comorbidity 2    Examination-Activity Limitations Locomotion Level;Other    Examination-Participation Restrictions Other    Stability/Clinical Decision Making Evolving/Moderate complexity    Rehab Potential Fair    PT Frequency 2x / week    PT Duration 6 weeks    PT Treatment/Interventions ADLs/Self Care  Home Management;Cryotherapy;Electrical Stimulation;Ultrasound;Moist Heat;Therapeutic activities;Therapeutic exercise;Manual techniques;Patient/family education;Passive range of motion    PT Next Visit Plan Comprehensive spinal/core stregthening program.  Increased LAQ weight next visit.    Consulted and Agree with Plan of Care Patient  Patient will benefit from skilled therapeutic intervention in order to improve the following deficits and impairments:  Abnormal gait, Decreased activity tolerance, Decreased balance, Decreased coordination, Decreased range of motion, Decreased strength, Increased muscle spasms, Impaired tone, Postural dysfunction, Pain  Visit Diagnosis: Chronic bilateral low back pain with bilateral sciatica  Abnormal posture  Other abnormalities of gait and mobility     Problem List Patient Active Problem List   Diagnosis Date Noted  . Bladder tumor 02/06/2019  . Arthritis of right ankle 07/15/2015  . Septic arthritis of right ankle ([place]) 07/15/2015  . MSSA (methicillin susceptible Staphylococcus aureus) infection 07/15/2015  . Ankle fracture, right 07/15/2015  . Traumatic osteoarthritis of right ankle 07/15/2015  . Dysphagia, unspecified(787.20) 04/30/2014  . Heartburn 04/30/2014  . Shoulder impingement syndrome   . GERD (gastroesophageal reflux disease)     Standley Brooking, PTA 07/07/2020, 10:07 AM  [facility] [place], [place], [address] Phone: [phone]   Fax:  [phone]  Name: BIRCH FARINO MRN: [id number] Date of Birth: 1951/07/07

## 2020-07-09 ENCOUNTER — Other Ambulatory Visit: Payer: Self-pay

## 2020-07-09 ENCOUNTER — Encounter: Payer: Self-pay | Admitting: Physical Therapy

## 2020-07-09 ENCOUNTER — Ambulatory Visit: Payer: PPO | Attending: Neurological Surgery | Admitting: Physical Therapy

## 2020-07-09 DIAGNOSIS — R293 Abnormal posture: Secondary | ICD-10-CM | POA: Diagnosis not present

## 2020-07-09 DIAGNOSIS — M5442 Lumbago with sciatica, left side: Secondary | ICD-10-CM | POA: Diagnosis not present

## 2020-07-09 DIAGNOSIS — G8929 Other chronic pain: Secondary | ICD-10-CM | POA: Insufficient documentation

## 2020-07-09 DIAGNOSIS — R2689 Other abnormalities of gait and mobility: Secondary | ICD-10-CM | POA: Insufficient documentation

## 2020-07-09 DIAGNOSIS — M5441 Lumbago with sciatica, right side: Secondary | ICD-10-CM | POA: Insufficient documentation

## 2020-07-09 NOTE — Therapy (Signed)
[facility] [place], [place], [address] Phone: [phone]   Fax:  [phone]  Physical Therapy Treatment  Patient Details  Name: Jeremy Day MRN: [id number] Date of Birth: [birth year] Referring Provider (PT): Margarita Mail MD.   Encounter Date: 07/09/2020   PT End of Session - 07/09/20 1143    Visit Number 4    Number of Visits 12    PT Start Time 1115    PT Stop Time 1200    PT Time Calculation (min) 45 min    Activity Tolerance Patient tolerated treatment well    Behavior During Therapy [facility] for tasks assessed/performed           Past Medical History:  Diagnosis Date  . Abnormal tandem walk    using 2 canes due to lower back arthritis  . Ascending aorta dilatation (HCC)    echo  04-11-2016  61mm and dilated aortc root 45mm  . Bladder tumor   . BPH (benign prostatic hyperplasia)   . Cancer ([place]) 2020   bladder  . Depression   . Diverticulosis   . Full dentures   . GERD (gastroesophageal reflux disease)   . Hiatal hernia   . History of colon polyps   . History of staphylococcal infection 2008   right ankle  w/ sepsis  . OA (osteoarthritis)    lower back, right ankle,, bilateral knees and shouldres  . Pneumonia yrs ago  . Pre-diabetes   . Psoriasis   . PVC's (premature ventricular contractions)    2012-- hx bigeminy/ trigeminy with near syncope (cardiology consult note in epic dated 2012)  (last cardiology visit in epic w/ dr Marlou Porch, 03-27-2016)  . Seasonal allergies   . Urgency of urination     Past Surgical History:  Procedure Laterality Date  . ANKLE ARTHROSCOPY Right 02-13-2007    dr [name]   w/ debridement and removal loose body  . COLONOSCOPY  last one summer 2019  . INGUINAL HERNIA REPAIR Right 1980s  . INGUINAL LYMPH NODE BIOPSY  2001  approx.   benign  . TRANSURETHRAL RESECTION OF BLADDER TUMOR N/A 02/06/2019   Procedure: TRANSURETHRAL RESECTION OF BLADDER TUMOR (TURBT);  Surgeon: Lucas Mallow, MD;  [facility];  Service: Urology;  Laterality: N/A;  . TRANSURETHRAL RESECTION OF BLADDER TUMOR N/A 02/20/2019   Procedure: TRANSURETHRAL RESECTION OF BLADDER TUMOR (TURBT);  Surgeon: Lucas Mallow, MD;  [facility];  Service: Urology;  Laterality: N/A;  . TRANSURETHRAL RESECTION OF BLADDER TUMOR N/A 03/13/2019   Procedure: TRANSURETHRAL RESECTION OF BLADDER TUMOR (TURBT);  Surgeon: Lucas Mallow, MD;  [facility];  Service: Urology;  Laterality: N/A;  . TRANSURETHRAL RESECTION OF BLADDER TUMOR WITH MITOMYCIN-C N/A 09/23/2019   Procedure: TRANSURETHRAL RESECTION OF BLADDER TUMOR WITH GEMCITABINE INSTILLATION INTO THE BLADDER;  Surgeon: Lucas Mallow, MD;  Location: [facility];  Service: Urology;  Laterality: N/A;  . TRANSURETHRAL RESECTION OF BLADDER TUMOR WITH MITOMYCIN-C N/A 06/24/2020   Procedure: TRANSURETHRAL RESECTION OF BLADDER TUMOR WITH POST OPERATIVE GEMCITABINE INSTILLATION;  Surgeon: Lucas Mallow, MD;  Location: [place];  Service: Urology;  Laterality: N/A;    There were no vitals filed for this visit.   Subjective Assessment - 07/09/20 1131    Subjective Covid-19 screen performed prior to pt entering clinic. Pt presents with 4[birth year]9/44 pain with certain movements. No pain reported at rest.    Pertinent History Bladder  cancer, right ankle surgery.    How long can you stand comfortably? Varies.    How long can you walk comfortably? Varies.    Patient Stated Goals Improve strength prior to surgery.    Currently in Pain? Yes    Pain Score 6     Pain Location Back    Pain Orientation Right;Left;Lower   sacral region   Pain Descriptors / Indicators Stabbing    Pain Type Chronic pain    Pain Onset More than a month ago    Aggravating Factors  certain movements                             [facility] Adult PT Treatment/Exercise - 07/09/20 0001      Exercises   Exercises Lumbar;Knee/Hip       Lumbar Exercises: Aerobic   Nustep L4 x 6 minutes      Lumbar Exercises: Supine   Ab Set 15 reps;5 seconds    Clam 20 reps;3 seconds    Clam Limitations green theraband    Bridge 15 reps;5 seconds    Bridge Limitations bridge with LEs on blue ball x15 reps 3 sec holds    Straight Leg Raise 20 reps;2 seconds    Isometric Hip Flexion 20 reps;3 seconds      Lumbar Exercises: Prone   Straight Leg Raise 10 reps;3 seconds    Straight Leg Raises Limitations unable to lift off mat, pt was then placed in knee flexion to 90 degrees and Lower leg was supported x 15.     Other Prone Lumbar Exercises hamstring curls 15 divided into 2 sets      Manual Therapy   Manual Therapy Soft tissue mobilization    Manual therapy comments 5 minutes    Soft tissue mobilization lumbar paraspinals and sacral border                  PT Education - 07/09/20 1142    Education Details Reveiwed HEP    Person(s) Educated Patient    Methods Explanation    Comprehension Verbalized understanding               PT Long Term Goals - 07/09/20 1151      PT LONG TERM GOAL #1   Title Indepedent with a HEP and progression.    Status On-going                 Plan - 07/09/20 1144    Clinical Impression Statement Pt tolerating treatment well. No pain at start of session, intermittent pain with some exercises. Pt able to move to adjust to comfort. Pt progressing with core strength and stability. Continue skilled PT.    Personal Factors and Comorbidities Comorbidity 1;Comorbidity 2    Examination-Activity Limitations Locomotion Level;Other    Stability/Clinical Decision Making Evolving/Moderate complexity    Rehab Potential Fair    PT Frequency 2x / week    PT Treatment/Interventions ADLs/Self Care Home Management;Cryotherapy;Electrical Stimulation;Ultrasound;Moist Heat;Therapeutic activities;Therapeutic exercise;Manual techniques;Patient/family education;Passive range of motion    PT Next Visit  Plan Comprehensive spinal/core stregthening program.  Increased LAQ weight next visit.    Consulted and Agree with Plan of Care Patient           Patient will benefit from skilled therapeutic intervention in order to improve the following deficits and impairments:  Abnormal gait, Decreased activity tolerance, Decreased balance, Decreased coordination, Decreased range of motion, Decreased strength, Increased  muscle spasms, Impaired tone, Postural dysfunction, Pain  Visit Diagnosis: Chronic bilateral low back pain with bilateral sciatica  Abnormal posture  Other abnormalities of gait and mobility     Problem List Patient Active Problem List   Diagnosis Date Noted  . Bladder tumor 02/06/2019  . Arthritis of right ankle 07/15/2015  . Septic arthritis of right ankle ([place]) 07/15/2015  . MSSA (methicillin susceptible Staphylococcus aureus) infection 07/15/2015  . Ankle fracture, right 07/15/2015  . Traumatic osteoarthritis of right ankle 07/15/2015  . Dysphagia, unspecified(787.20) 04/30/2014  . Heartburn 04/30/2014  . Shoulder impingement syndrome   . GERD (gastroesophageal reflux disease)     Oretha Caprice, PT, MPT 07/09/2020, 12:07 PM  [facility] [address] [place], [place], [address] Phone: [phone]   Fax:  [phone]  Name: Jeremy Day MRN: [id number] Date of Birth: 13-Nov-1950

## 2020-07-10 ENCOUNTER — Encounter: Payer: PPO | Admitting: Physical Therapy

## 2020-07-15 ENCOUNTER — Encounter: Payer: Self-pay | Admitting: Physical Therapy

## 2020-07-15 ENCOUNTER — Ambulatory Visit: Payer: PPO | Admitting: Physical Therapy

## 2020-07-15 ENCOUNTER — Other Ambulatory Visit: Payer: Self-pay

## 2020-07-15 DIAGNOSIS — R293 Abnormal posture: Secondary | ICD-10-CM

## 2020-07-15 DIAGNOSIS — R2689 Other abnormalities of gait and mobility: Secondary | ICD-10-CM

## 2020-07-15 DIAGNOSIS — M5442 Lumbago with sciatica, left side: Secondary | ICD-10-CM | POA: Diagnosis not present

## 2020-07-15 DIAGNOSIS — G8929 Other chronic pain: Secondary | ICD-10-CM

## 2020-07-15 NOTE — Therapy (Signed)
[facility] [place], [place], [address] Phone: [phone]   Fax:  [phone]  Physical Therapy Treatment  Patient Details  Name: Jeremy Day MRN: [id number] Date of Birth: [birth year] Referring Provider (PT): Margarita Mail MD.   Encounter Date: 07/15/2020   PT End of Session - 07/15/20 1039    Visit Number 5    Number of Visits 12    Date for PT Re-Evaluation 08/12/20    PT Start Time 1031    PT Stop Time 1114    PT Time Calculation (min) 43 min    Activity Tolerance Patient tolerated treatment well    Behavior During Therapy [facility] for tasks assessed/performed           Past Medical History:  Diagnosis Date  . Abnormal tandem walk    using 2 canes due to lower back arthritis  . Ascending aorta dilatation (HCC)    echo  04-11-2016  32mm and dilated aortc root 79mm  . Bladder tumor   . BPH (benign prostatic hyperplasia)   . Cancer ([place]) 2020   bladder  . Depression   . Diverticulosis   . Full dentures   . GERD (gastroesophageal reflux disease)   . Hiatal hernia   . History of colon polyps   . History of staphylococcal infection 2008   right ankle  w/ sepsis  . OA (osteoarthritis)    lower back, right ankle,, bilateral knees and shouldres  . Pneumonia yrs ago  . Pre-diabetes   . Psoriasis   . PVC's (premature ventricular contractions)    2012-- hx bigeminy/ trigeminy with near syncope (cardiology consult note in epic dated 2012)  (last cardiology visit in epic w/ dr Marlou Porch, 03-27-2016)  . Seasonal allergies   . Urgency of urination     Past Surgical History:  Procedure Laterality Date  . ANKLE ARTHROSCOPY Right 02-13-2007    dr [name]   w/ debridement and removal loose body  . COLONOSCOPY  last one summer 2019  . INGUINAL HERNIA REPAIR Right 1980s  . INGUINAL LYMPH NODE BIOPSY  2001  approx.   benign  . TRANSURETHRAL RESECTION OF BLADDER TUMOR N/A 02/06/2019   Procedure: TRANSURETHRAL RESECTION OF BLADDER  TUMOR (TURBT);  Surgeon: Lucas Mallow, MD;  Location: [facility];  Service: Urology;  Laterality: N/A;  . TRANSURETHRAL RESECTION OF BLADDER TUMOR N/A 02/20/2019   Procedure: TRANSURETHRAL RESECTION OF BLADDER TUMOR (TURBT);  Surgeon: Lucas Mallow, MD;  [facility];  Service: Urology;  Laterality: N/A;  . TRANSURETHRAL RESECTION OF BLADDER TUMOR N/A 03/13/2019   Procedure: TRANSURETHRAL RESECTION OF BLADDER TUMOR (TURBT);  Surgeon: Lucas Mallow, MD;  [facility];  Service: Urology;  Laterality: N/A;  . TRANSURETHRAL RESECTION OF BLADDER TUMOR WITH MITOMYCIN-C N/A 09/23/2019   Procedure: TRANSURETHRAL RESECTION OF BLADDER TUMOR WITH GEMCITABINE INSTILLATION INTO THE BLADDER;  Surgeon: Lucas Mallow, MD;  Location: [facility];  Service: Urology;  Laterality: N/A;  . TRANSURETHRAL RESECTION OF BLADDER TUMOR WITH MITOMYCIN-C N/A 06/24/2020   Procedure: TRANSURETHRAL RESECTION OF BLADDER TUMOR WITH POST OPERATIVE GEMCITABINE INSTILLATION;  Surgeon: Lucas Mallow, MD;  Location: [place];  Service: Urology;  Laterality: N/A;    There were no vitals filed for this visit.   Subjective Assessment - 07/15/20 1036    Subjective Covid-19 screen performed prior to pt entering clinic. Patient some good days to where he hasn't had constant sacral  pain. But still having pain.    Pertinent History Bladder cancer, right ankle surgery.    How long can you stand comfortably? Varies.    How long can you walk comfortably? Varies.    Patient Stated Goals Improve strength prior to surgery.    Currently in Pain? Yes    Pain Score 3     Pain Location Back    Pain Orientation Right;Left;Lower    Pain Descriptors / Indicators Discomfort    Pain Type Chronic pain    Pain Onset More than a month ago    Pain Frequency Intermittent              [facility] PT Assessment - 07/15/20 0001      Assessment   Medical Diagnosis Scoliosis.    Referring Provider  (PT) Margarita Mail MD.    Next MD Visit 07/16/2020      Precautions   Precautions Fall      Restrictions   Weight Bearing Restrictions No                         [facility] Adult PT Treatment/Exercise - 07/15/20 0001      Lumbar Exercises: Aerobic   Nustep L6 x10 min      Lumbar Exercises: Supine   Bent Knee Raise 20 reps;3 seconds;Limitations    Bent Knee Raise Limitations green theraband    Bridge 15 reps;5 seconds    Bridge Limitations bridge with LEs on red ball x15 reps 3 sec holds    Straight Leg Raise 15 reps;3 seconds    Other Supine Lumbar Exercises isometric hip ext into wall x20 reps 5 sec holds      Lumbar Exercises: Prone   Other Prone Lumbar Exercises AROM RLE x15 reps, LLE x11 reps      Knee/Hip Exercises: Seated   Long Arc Quad Strengthening;Both;15 reps;Weights    Long Arc Quad Weight 4 lbs.      Knee/Hip Exercises: Supine   Hip Adduction Isometric Strengthening;Both;20 reps    Other Supine Knee/Hip Exercises B HS set x20 reps 5 sec hold    Other Supine Knee/Hip Exercises clam red theraband x20 reps                       PT Long Term Goals - 07/15/20 1125      PT LONG TERM GOAL #1   Title Indepedent with a HEP and progression.    Status Achieved                 Plan - 07/15/20 1120    Clinical Impression Statement Patient presented in clinic with continued intermittant LBP and LE weakness. Patient still very weak especially with hip extensors and HS. Patient reports that when pain begins he is able to self control with movement or position change. Patient continues to use B SPC for tandem AD walking.    Personal Factors and Comorbidities Comorbidity 1;Comorbidity 2    Examination-Activity Limitations Locomotion Level;Other    Examination-Participation Restrictions Other    Stability/Clinical Decision Making Evolving/Moderate complexity    Rehab Potential Fair    PT Frequency 2x / week    PT Duration 6 weeks    PT  Treatment/Interventions ADLs/Self Care Home Management;Cryotherapy;Electrical Stimulation;Ultrasound;Moist Heat;Therapeutic activities;Therapeutic exercise;Manual techniques;Patient/family education;Passive range of motion    PT Next Visit Plan Comprehensive spinal/core stregthening program.  Increased LAQ weight next visit.    Consulted and Agree with  Plan of Care Patient           Patient will benefit from skilled therapeutic intervention in order to improve the following deficits and impairments:  Abnormal gait, Decreased activity tolerance, Decreased balance, Decreased coordination, Decreased range of motion, Decreased strength, Increased muscle spasms, Impaired tone, Postural dysfunction, Pain  Visit Diagnosis: Chronic bilateral low back pain with bilateral sciatica  Abnormal posture  Other abnormalities of gait and mobility     Problem List Patient Active Problem List   Diagnosis Date Noted  . Bladder tumor 02/06/2019  . Arthritis of right ankle 07/15/2015  . Septic arthritis of right ankle ([place]) 07/15/2015  . MSSA (methicillin susceptible Staphylococcus aureus) infection 07/15/2015  . Ankle fracture, right 07/15/2015  . Traumatic osteoarthritis of right ankle 07/15/2015  . Dysphagia, unspecified(787.20) 04/30/2014  . Heartburn 04/30/2014  . Shoulder impingement syndrome   . GERD (gastroesophageal reflux disease)     Standley Brooking, PTA 07/15/2020, 11:26 AM  [facility] [place], [place], [address] Phone: [phone]   Fax:  [phone]  Name: Jeremy Day MRN: [id number] Date of Birth: 11-18-1950

## 2020-07-16 ENCOUNTER — Encounter: Payer: PPO | Admitting: Physical Therapy

## 2020-07-16 DIAGNOSIS — M4316 Spondylolisthesis, lumbar region: Secondary | ICD-10-CM | POA: Diagnosis not present

## 2020-07-16 DIAGNOSIS — Z6826 Body mass index (BMI) 26.0-26.9, adult: Secondary | ICD-10-CM | POA: Diagnosis not present

## 2020-07-23 ENCOUNTER — Ambulatory Visit: Payer: PPO | Admitting: Physical Therapy

## 2020-07-23 ENCOUNTER — Other Ambulatory Visit: Payer: Self-pay

## 2020-07-23 DIAGNOSIS — R2689 Other abnormalities of gait and mobility: Secondary | ICD-10-CM

## 2020-07-23 DIAGNOSIS — M5442 Lumbago with sciatica, left side: Secondary | ICD-10-CM | POA: Diagnosis not present

## 2020-07-23 DIAGNOSIS — R293 Abnormal posture: Secondary | ICD-10-CM

## 2020-07-23 DIAGNOSIS — G8929 Other chronic pain: Secondary | ICD-10-CM

## 2020-07-23 NOTE — Therapy (Signed)
[facility] [place], [place], [address] Phone: [phone]   Fax:  [phone]  Physical Therapy Treatment  Patient Details  Name: Jeremy Day MRN: [id number] Date of Birth: 02/11/1951 Referring Provider (PT): Margarita Mail MD.   Encounter Date: 07/23/2020   PT End of Session - 07/23/20 0949    Visit Number 6    Number of Visits 12    Date for PT Re-Evaluation 08/12/20    PT Start Time 0903    PT Stop Time 0951    PT Time Calculation (min) 48 min    Activity Tolerance Patient tolerated treatment well    Behavior During Therapy [facility] for tasks assessed/performed           Past Medical History:  Diagnosis Date  . Abnormal tandem walk    using 2 canes due to lower back arthritis  . Ascending aorta dilatation (HCC)    echo  04-11-2016  76mm and dilated aortc root 25mm  . Bladder tumor   . BPH (benign prostatic hyperplasia)   . Cancer ([place]) 2020   bladder  . Depression   . Diverticulosis   . Full dentures   . GERD (gastroesophageal reflux disease)   . Hiatal hernia   . History of colon polyps   . History of staphylococcal infection 2008   right ankle  w/ sepsis  . OA (osteoarthritis)    lower back, right ankle,, bilateral knees and shouldres  . Pneumonia yrs ago  . Pre-diabetes   . Psoriasis   . PVC's (premature ventricular contractions)    2012-- hx bigeminy/ trigeminy with near syncope (cardiology consult note in epic dated 2012)  (last cardiology visit in epic w/ dr Marlou Porch, 03-27-2016)  . Seasonal allergies   . Urgency of urination     Past Surgical History:  Procedure Laterality Date  . ANKLE ARTHROSCOPY Right 02-13-2007    dr [name]   w/ debridement and removal loose body  . COLONOSCOPY  last one summer 2019  . INGUINAL HERNIA REPAIR Right 1980s  . INGUINAL LYMPH NODE BIOPSY  2001  approx.   benign  . TRANSURETHRAL RESECTION OF BLADDER TUMOR N/A 02/06/2019   Procedure: TRANSURETHRAL RESECTION OF BLADDER  TUMOR (TURBT);  Surgeon: Lucas Mallow, MD;  [facility];  Service: Urology;  Laterality: N/A;  . TRANSURETHRAL RESECTION OF BLADDER TUMOR N/A 02/20/2019   Procedure: TRANSURETHRAL RESECTION OF BLADDER TUMOR (TURBT);  Surgeon: Lucas Mallow, MD;  [facility];  Service: Urology;  Laterality: N/A;  . TRANSURETHRAL RESECTION OF BLADDER TUMOR N/A 03/13/2019   Procedure: TRANSURETHRAL RESECTION OF BLADDER TUMOR (TURBT);  Surgeon: Lucas Mallow, MD;  [facility];  Service: Urology;  Laterality: N/A;  . TRANSURETHRAL RESECTION OF BLADDER TUMOR WITH MITOMYCIN-C N/A 09/23/2019   Procedure: TRANSURETHRAL RESECTION OF BLADDER TUMOR WITH GEMCITABINE INSTILLATION INTO THE BLADDER;  Surgeon: Lucas Mallow, MD;  Location: [place];  Service: Urology;  Laterality: N/A;  . TRANSURETHRAL RESECTION OF BLADDER TUMOR WITH MITOMYCIN-C N/A 06/24/2020   Procedure: TRANSURETHRAL RESECTION OF BLADDER TUMOR WITH POST OPERATIVE GEMCITABINE INSTILLATION;  Surgeon: Lucas Mallow, MD;  Location: [place];  Service: Urology;  Laterality: N/A;    There were no vitals filed for this visit.   Subjective Assessment - 07/23/20 0908    Subjective Covid-19 screen performed prior to pt entering clinic. Patient arrived with ongoing discomfort and unsure of progress  Pertinent History Bladder cancer, right ankle surgery.    How long can you stand comfortably? Varies.    How long can you walk comfortably? Varies.    Patient Stated Goals Improve strength prior to surgery.    Currently in Pain? Yes    Pain Score 3     Pain Location Back    Pain Orientation Right;Left    Pain Descriptors / Indicators Discomfort    Pain Type Chronic pain    Pain Radiating Towards bil LE numbess and tingling symptoms    Pain Onset More than a month ago    Pain Frequency Intermittent    Aggravating Factors  walking /staning    Pain Relieving Factors sitting/laying down                               [facility] Adult PT Treatment/Exercise - 07/23/20 0001      Lumbar Exercises: Aerobic   Nustep L6 x13 min UE/LE, monitore fpr progression (70 SPM)      Lumbar Exercises: Seated   Other Seated Lumbar Exercises seated core activation w rows and latt pull x15 each w green band      Lumbar Exercises: Supine   Straight Leg Raise 10 reps;3 seconds   with core activation     Lumbar Exercises: Prone   Straight Leg Raise 10 reps;3 seconds   very difficult yet able to activate muscles     Knee/Hip Exercises: Machines for Strengthening   Cybex Knee Extension 10# 2x10    Cybex Knee Flexion 10# 2x10      Knee/Hip Exercises: Seated   Hamstring Curl Strengthening;Both;20 reps    Hamstring Limitations greed band      Knee/Hip Exercises: Sidelying   Hip ABduction Strengthening;Both;1 set;10 reps    Clams x10 each LE                  PT Education - 07/23/20 1002    Education Details Education on cont HEP with core activation and progression with glut sets    Person(s) Educated Patient    Methods Explanation;Demonstration;Verbal cues    Comprehension Verbalized understanding;Returned demonstration               PT Long Term Goals - 07/15/20 1125      PT LONG TERM GOAL #1   Title Indepedent with a HEP and progression.    Status Achieved                 Plan - 07/23/20 0950    Clinical Impression Statement Patient tolerated treatment well today and able to progress with all exercises for core and bil LE. Patient educated on home progression with core activation and glut sets. Patient continues to perfrom daily HEP to prepare for his surgery next month. Patient is limited with LE weakness yet able to complete all exercises some modified with muscle activation (left hip ext). Patient continues to use bil [facility] for ambulation. Will cont with POC for strengthening and education to prepare for surgery.    Personal Factors and Comorbidities  Comorbidity 1;Comorbidity 2    Examination-Activity Limitations Locomotion Level;Other    Examination-Participation Restrictions Other    Stability/Clinical Decision Making Evolving/Moderate complexity    Rehab Potential Fair    PT Frequency 2x / week    PT Duration 6 weeks    PT Treatment/Interventions ADLs/Self Care Home Management;Cryotherapy;Electrical Stimulation;Ultrasound;Moist Heat;Therapeutic activities;Therapeutic exercise;Manual techniques;Patient/family education;Passive range of motion  PT Next Visit Plan Comprehensive spinal/core stregthening program.  .    Consulted and Agree with Plan of Care Patient           Patient will benefit from skilled therapeutic intervention in order to improve the following deficits and impairments:  Abnormal gait, Decreased activity tolerance, Decreased balance, Decreased coordination, Decreased range of motion, Decreased strength, Increased muscle spasms, Impaired tone, Postural dysfunction, Pain  Visit Diagnosis: Chronic bilateral low back pain with bilateral sciatica  Abnormal posture  Other abnormalities of gait and mobility     Problem List Patient Active Problem List   Diagnosis Date Noted  . Bladder tumor 02/06/2019  . Arthritis of right ankle 07/15/2015  . Septic arthritis of right ankle ([place]) 07/15/2015  . MSSA (methicillin susceptible Staphylococcus aureus) infection 07/15/2015  . Ankle fracture, right 07/15/2015  . Traumatic osteoarthritis of right ankle 07/15/2015  . Dysphagia, unspecified(787.20) 04/30/2014  . Heartburn 04/30/2014  . Shoulder impingement syndrome   . GERD (gastroesophageal reflux disease)     Avanni Turnbaugh P, PTA 07/23/2020, 10:04 AM  [facility] [place], [place], [address] Phone: [phone]   Fax:  [phone]  Name: Jeremy Day MRN: [id number] Date of Birth: March 04, 1951

## 2020-07-27 ENCOUNTER — Ambulatory Visit: Payer: PPO | Admitting: Physical Therapy

## 2020-07-27 ENCOUNTER — Other Ambulatory Visit: Payer: Self-pay

## 2020-07-27 ENCOUNTER — Encounter: Payer: Self-pay | Admitting: Physical Therapy

## 2020-07-27 DIAGNOSIS — R2689 Other abnormalities of gait and mobility: Secondary | ICD-10-CM

## 2020-07-27 DIAGNOSIS — M5442 Lumbago with sciatica, left side: Secondary | ICD-10-CM | POA: Diagnosis not present

## 2020-07-27 DIAGNOSIS — R293 Abnormal posture: Secondary | ICD-10-CM

## 2020-07-27 DIAGNOSIS — G8929 Other chronic pain: Secondary | ICD-10-CM

## 2020-07-27 NOTE — Therapy (Signed)
[facility] [place], [place], [address] Phone: [phone]   Fax:  [phone]  Physical Therapy Treatment  Patient Details  Name: Jeremy Day MRN: [id number] Date of Birth: 1950-12-22 Referring Provider (PT): Margarita Mail MD.   Encounter Date: 07/27/2020   PT End of Session - 07/27/20 0910    Visit Number 7    Number of Visits 12    Date for PT Re-Evaluation 08/12/20    PT Start Time 0902    PT Stop Time 0945    PT Time Calculation (min) 43 min    Equipment Utilized During Treatment Other (comment)   B SPC   Activity Tolerance Patient tolerated treatment well    Behavior During Therapy [facility] for tasks assessed/performed           Past Medical History:  Diagnosis Date  . Abnormal tandem walk    using 2 canes due to lower back arthritis  . Ascending aorta dilatation (HCC)    echo  04-11-2016  78mm and dilated aortc root 72mm  . Bladder tumor   . BPH (benign prostatic hyperplasia)   . Cancer ([place]) 2020   bladder  . Depression   . Diverticulosis   . Full dentures   . GERD (gastroesophageal reflux disease)   . Hiatal hernia   . History of colon polyps   . History of staphylococcal infection 2008   right ankle  w/ sepsis  . OA (osteoarthritis)    lower back, right ankle,, bilateral knees and shouldres  . Pneumonia yrs ago  . Pre-diabetes   . Psoriasis   . PVC's (premature ventricular contractions)    2012-- hx bigeminy/ trigeminy with near syncope (cardiology consult note in epic dated 2012)  (last cardiology visit in epic w/ dr Marlou Porch, 03-27-2016)  . Seasonal allergies   . Urgency of urination     Past Surgical History:  Procedure Laterality Date  . ANKLE ARTHROSCOPY Right 02-13-2007    dr [name]   w/ debridement and removal loose body  . COLONOSCOPY  last one summer 2019  . INGUINAL HERNIA REPAIR Right 1980s  . INGUINAL LYMPH NODE BIOPSY  2001  approx.   benign  . TRANSURETHRAL RESECTION OF BLADDER  TUMOR N/A 02/06/2019   Procedure: TRANSURETHRAL RESECTION OF BLADDER TUMOR (TURBT);  Surgeon: Lucas Mallow, MD;  [facility];  Service: Urology;  Laterality: N/A;  . TRANSURETHRAL RESECTION OF BLADDER TUMOR N/A 02/20/2019   Procedure: TRANSURETHRAL RESECTION OF BLADDER TUMOR (TURBT);  Surgeon: Lucas Mallow, MD;  [facility];  Service: Urology;  Laterality: N/A;  . TRANSURETHRAL RESECTION OF BLADDER TUMOR N/A 03/13/2019   Procedure: TRANSURETHRAL RESECTION OF BLADDER TUMOR (TURBT);  Surgeon: Lucas Mallow, MD;  [facility];  Service: Urology;  Laterality: N/A;  . TRANSURETHRAL RESECTION OF BLADDER TUMOR WITH MITOMYCIN-C N/A 09/23/2019   Procedure: TRANSURETHRAL RESECTION OF BLADDER TUMOR WITH GEMCITABINE INSTILLATION INTO THE BLADDER;  Surgeon: Lucas Mallow, MD;  Location: [facility];  Service: Urology;  Laterality: N/A;  . TRANSURETHRAL RESECTION OF BLADDER TUMOR WITH MITOMYCIN-C N/A 06/24/2020   Procedure: TRANSURETHRAL RESECTION OF BLADDER TUMOR WITH POST OPERATIVE GEMCITABINE INSTILLATION;  Surgeon: Lucas Mallow, MD;  Location: [place];  Service: Urology;  Laterality: N/A;    There were no vitals filed for this visit.   Subjective Assessment - 07/27/20 0903    Subjective Covid-19 screen performed prior to pt entering  clinic. Patient reports he is to have surgery on 08/17/2020 and to cotinue PT until surgery.    Pertinent History Bladder cancer, right ankle surgery.    How long can you stand comfortably? Varies.    How long can you walk comfortably? Varies.    Patient Stated Goals Improve strength prior to surgery.    Currently in Pain? Yes    Pain Score --   No rating provided   Pain Location Hip   "everything"   Pain Orientation Right;Left    Pain Descriptors / Indicators Sore    Pain Onset More than a month ago    Pain Frequency Constant              [facility] PT Assessment - 07/27/20 0001      Assessment    Medical Diagnosis Scoliosis.    Referring Provider (PT) Margarita Mail MD.    Next MD Visit 08/17/2020   for surgery     Precautions   Precautions Fall      Restrictions   Weight Bearing Restrictions No                         [facility] Adult PT Treatment/Exercise - 07/27/20 0001      Lumbar Exercises: Aerobic   Nustep L5 x15 min      Lumbar Exercises: Machines for Strengthening   Cybex Lumbar Extension 40# x20 reps    Other Lumbar Machine Exercise core crunch 70# x50 reps      Lumbar Exercises: Supine   Clam 20 reps   green theraband   Bridge with [employer] 20 reps;3 seconds    Bridge with [employer] Limitations 4# ball    Bridge with clamshell 20 reps;3 seconds   green theraband   Bridge with [employer] Limitations      Straight Leg Raise 20 reps;3 seconds      Knee/Hip Exercises: Machines for Strengthening   Cybex Knee Extension 10# 2x10    Cybex Knee Flexion 10# 2x10    Cybex Leg Press 1.5 pl, seat 7 x25 reps      Knee/Hip Exercises: Seated   Sit to Sand 10 reps;without UE support      Knee/Hip Exercises: Sidelying   Hip ABduction Strengthening;Both;20 reps    Clams BLE 2x10 rps                       PT Long Term Goals - 07/15/20 1125      PT LONG TERM GOAL #1   Title Indepedent with a HEP and progression.    Status Achieved                 Plan - 07/27/20 1010    Clinical Impression Statement Patient presented in clinic with reports of more generalized, hip soreness. Patient approved for surgery next month. Patient progressed to more machine strengthening and more resistance as well. HS and hip abductors still very weak at this time.    Personal Factors and Comorbidities Comorbidity 1;Comorbidity 2    Examination-Activity Limitations Locomotion Level;Other    Examination-Participation Restrictions Other    Stability/Clinical Decision Making Evolving/Moderate complexity    Rehab Potential Fair    PT Frequency 2x / week     PT Duration 6 weeks    PT Treatment/Interventions ADLs/Self Care Home Management;Cryotherapy;Electrical Stimulation;Ultrasound;Moist Heat;Therapeutic activities;Therapeutic exercise;Manual techniques;Patient/family education;Passive range of motion    PT Next Visit Plan Comprehensive spinal/core stregthening program.  .  Consulted and Agree with Plan of Care Patient           Patient will benefit from skilled therapeutic intervention in order to improve the following deficits and impairments:  Abnormal gait, Decreased activity tolerance, Decreased balance, Decreased coordination, Decreased range of motion, Decreased strength, Increased muscle spasms, Impaired tone, Postural dysfunction, Pain  Visit Diagnosis: Chronic bilateral low back pain with bilateral sciatica  Abnormal posture  Other abnormalities of gait and mobility     Problem List Patient Active Problem List   Diagnosis Date Noted  . Bladder tumor 02/06/2019  . Arthritis of right ankle 07/15/2015  . Septic arthritis of right ankle ([place]) 07/15/2015  . MSSA (methicillin susceptible Staphylococcus aureus) infection 07/15/2015  . Ankle fracture, right 07/15/2015  . Traumatic osteoarthritis of right ankle 07/15/2015  . Dysphagia, unspecified(787.20) 04/30/2014  . Heartburn 04/30/2014  . Shoulder impingement syndrome   . GERD (gastroesophageal reflux disease)     Standley Brooking, PTA 07/27/2020, 10:13 AM  [facility] [place], [place], [address] Phone: [phone]   Fax:  [phone]  Name: Jeremy Day MRN: [id number] Date of Birth: 1951/02/18

## 2020-07-30 ENCOUNTER — Ambulatory Visit: Payer: PPO | Admitting: *Deleted

## 2020-07-30 ENCOUNTER — Other Ambulatory Visit: Payer: Self-pay

## 2020-07-30 DIAGNOSIS — G8929 Other chronic pain: Secondary | ICD-10-CM

## 2020-07-30 DIAGNOSIS — R2689 Other abnormalities of gait and mobility: Secondary | ICD-10-CM

## 2020-07-30 DIAGNOSIS — R293 Abnormal posture: Secondary | ICD-10-CM

## 2020-07-30 DIAGNOSIS — M5442 Lumbago with sciatica, left side: Secondary | ICD-10-CM | POA: Diagnosis not present

## 2020-07-30 NOTE — Therapy (Signed)
[facility] [place], [place], [address] Phone: [phone]   Fax:  [phone]  Physical Therapy Treatment  Patient Details  Name: Jeremy Day MRN: [id number] Date of Birth: 10/16/1951 Referring Provider (PT): Margarita Mail MD.   Encounter Date: 07/30/2020   PT End of Session - 07/30/20 0911    Visit Number 8    Date for PT Re-Evaluation 08/12/20    PT Start Time 0907    PT Stop Time 0951    PT Time Calculation (min) 44 min           Past Medical History:  Diagnosis Date  . Abnormal tandem walk    using 2 canes due to lower back arthritis  . Ascending aorta dilatation (HCC)    echo  04-11-2016  63mm and dilated aortc root 14mm  . Bladder tumor   . BPH (benign prostatic hyperplasia)   . Cancer ([place]) 2020   bladder  . Depression   . Diverticulosis   . Full dentures   . GERD (gastroesophageal reflux disease)   . Hiatal hernia   . History of colon polyps   . History of staphylococcal infection 2008   right ankle  w/ sepsis  . OA (osteoarthritis)    lower back, right ankle,, bilateral knees and shouldres  . Pneumonia yrs ago  . Pre-diabetes   . Psoriasis   . PVC's (premature ventricular contractions)    2012-- hx bigeminy/ trigeminy with near syncope (cardiology consult note in epic dated 2012)  (last cardiology visit in epic w/ dr Marlou Porch, 03-27-2016)  . Seasonal allergies   . Urgency of urination     Past Surgical History:  Procedure Laterality Date  . ANKLE ARTHROSCOPY Right 02-13-2007    dr [name]   w/ debridement and removal loose body  . COLONOSCOPY  last one summer 2019  . INGUINAL HERNIA REPAIR Right 1980s  . INGUINAL LYMPH NODE BIOPSY  2001  approx.   benign  . TRANSURETHRAL RESECTION OF BLADDER TUMOR N/A 02/06/2019   Procedure: TRANSURETHRAL RESECTION OF BLADDER TUMOR (TURBT);  Surgeon: Lucas Mallow, MD;  Location: [facility];  Service: Urology;  Laterality: N/A;  . TRANSURETHRAL RESECTION OF  BLADDER TUMOR N/A 02/20/2019   Procedure: TRANSURETHRAL RESECTION OF BLADDER TUMOR (TURBT);  Surgeon: Lucas Mallow, MD;  Location: [facility];  Service: Urology;  Laterality: N/A;  . TRANSURETHRAL RESECTION OF BLADDER TUMOR N/A 03/13/2019   Procedure: TRANSURETHRAL RESECTION OF BLADDER TUMOR (TURBT);  Surgeon: Lucas Mallow, MD;  Location: [facility];  Service: Urology;  Laterality: N/A;  . TRANSURETHRAL RESECTION OF BLADDER TUMOR WITH MITOMYCIN-C N/A 09/23/2019   Procedure: TRANSURETHRAL RESECTION OF BLADDER TUMOR WITH GEMCITABINE INSTILLATION INTO THE BLADDER;  Surgeon: Lucas Mallow, MD;  Location: [facility];  Service: Urology;  Laterality: N/A;  . TRANSURETHRAL RESECTION OF BLADDER TUMOR WITH MITOMYCIN-C N/A 06/24/2020   Procedure: TRANSURETHRAL RESECTION OF BLADDER TUMOR WITH POST OPERATIVE GEMCITABINE INSTILLATION;  Surgeon: Lucas Mallow, MD;  Location: [facility];  Service: Urology;  Laterality: N/A;    There were no vitals filed for this visit.   Subjective Assessment - 07/30/20 0908    Subjective Covid-19 screen performed prior to pt entering clinic. Patient reports he is to have surgery on 08/17/2020 and to cotinue PT until surgery. Dr. Maudie Mercury will do the surgery    Pertinent History Bladder cancer, right ankle surgery.    How long can  you stand comfortably? Varies.    How long can you walk comfortably? Varies.    Patient Stated Goals Improve strength prior to surgery.    Currently in Pain? Yes    Pain Score 3     Pain Location Hip    Pain Orientation Right;Left    Pain Descriptors / Indicators Sore    Pain Type Chronic pain    Pain Onset More than a month ago                             [facility] Adult PT Treatment/Exercise - 07/30/20 0001      Lumbar Exercises: Aerobic   Nustep L7 x15 min      Lumbar Exercises: Machines for Strengthening   Cybex Lumbar Extension 70# x50 reps    Other Lumbar Machine Exercise core  crunch 90# x20, 100#s x 20, x10      Lumbar Exercises: Supine   Bridge 20 reps;5 reps      Knee/Hip Exercises: Machines for Strengthening   Cybex Knee Extension 20# 3x10    Cybex Knee Flexion 30# 3x10      Knee/Hip Exercises: Sidelying   Hip ABduction Both;3 sets;10 reps;AAROM;AROM                       PT Long Term Goals - 07/15/20 1125      PT LONG TERM GOAL #1   Title Indepedent with a HEP and progression.    Status Achieved                 Plan - 07/30/20 0912    Clinical Impression Statement Pt arrived today doing fairly well with loer pain levels and mainly soreness, but with constant LE numbness. He was guided through core strengthening EXs and did well.    Examination-Activity Limitations Locomotion Level;Other    Examination-Participation Restrictions Other    Stability/Clinical Decision Making Evolving/Moderate complexity    Rehab Potential Fair    PT Frequency 2x / week    PT Duration 6 weeks    PT Treatment/Interventions ADLs/Self Care Home Management;Cryotherapy;Electrical Stimulation;Ultrasound;Moist Heat;Therapeutic activities;Therapeutic exercise;Manual techniques;Patient/family education;Passive range of motion    PT Next Visit Plan Comprehensive spinal/core stregthening program.  .    Consulted and Agree with Plan of Care Patient           Patient will benefit from skilled therapeutic intervention in order to improve the following deficits and impairments:  Abnormal gait, Decreased activity tolerance, Decreased balance, Decreased coordination, Decreased range of motion, Decreased strength, Increased muscle spasms, Impaired tone, Postural dysfunction, Pain  Visit Diagnosis: Chronic bilateral low back pain with bilateral sciatica  Abnormal posture  Other abnormalities of gait and mobility     Problem List Patient Active Problem List   Diagnosis Date Noted  . Bladder tumor 02/06/2019  . Arthritis of right ankle 07/15/2015  .  Septic arthritis of right ankle ([place]) 07/15/2015  . MSSA (methicillin susceptible Staphylococcus aureus) infection 07/15/2015  . Ankle fracture, right 07/15/2015  . Traumatic osteoarthritis of right ankle 07/15/2015  . Dysphagia, unspecified(787.20) 04/30/2014  . Heartburn 04/30/2014  . Shoulder impingement syndrome   . GERD (gastroesophageal reflux disease)     Catherina Pates,CHRIS, PTA 07/30/2020, 9:52 AM  [facility] [address] [place], [place], [address] Phone: [phone]   Fax:  [phone]  Name: DEMAR SHAD MRN: [id number] Date of Birth: 05-27-1951

## 2020-08-03 ENCOUNTER — Ambulatory Visit: Payer: PPO | Admitting: Physical Therapy

## 2020-08-03 DIAGNOSIS — R293 Abnormal posture: Secondary | ICD-10-CM

## 2020-08-03 DIAGNOSIS — M5442 Lumbago with sciatica, left side: Secondary | ICD-10-CM | POA: Diagnosis not present

## 2020-08-03 DIAGNOSIS — R2689 Other abnormalities of gait and mobility: Secondary | ICD-10-CM

## 2020-08-03 DIAGNOSIS — G8929 Other chronic pain: Secondary | ICD-10-CM

## 2020-08-03 DIAGNOSIS — M5441 Lumbago with sciatica, right side: Secondary | ICD-10-CM

## 2020-08-03 NOTE — Therapy (Signed)
[facility] [place], [place], [address] Phone: [phone]   Fax:  [phone]  Physical Therapy Treatment  Patient Details  Name: Jeremy Day MRN: [id number] Date of Birth: [birth year] Referring Provider (PT): Margarita Mail MD.   Encounter Date: 08/03/2020   PT End of Session - 08/03/20 0918    Visit Number 9    Number of Visits 12    Date for PT Re-Evaluation 08/12/20    PT Start Time 0901    PT Stop Time 0947    PT Time Calculation (min) 46 min    Activity Tolerance Patient tolerated treatment well    Behavior During Therapy [facility] for tasks assessed/performed           Past Medical History:  Diagnosis Date  . Abnormal tandem walk    using 2 canes due to lower back arthritis  . Ascending aorta dilatation (HCC)    echo  04-11-2016  70mm and dilated aortc root 46mm  . Bladder tumor   . BPH (benign prostatic hyperplasia)   . Cancer ([place]) 2020   bladder  . Depression   . Diverticulosis   . Full dentures   . GERD (gastroesophageal reflux disease)   . Hiatal hernia   . History of colon polyps   . History of staphylococcal infection 2008   right ankle  w/ sepsis  . OA (osteoarthritis)    lower back, right ankle,, bilateral knees and shouldres  . Pneumonia yrs ago  . Pre-diabetes   . Psoriasis   . PVC's (premature ventricular contractions)    2012-- hx bigeminy/ trigeminy with near syncope (cardiology consult note in epic dated 2012)  (last cardiology visit in epic w/ dr Marlou Porch, 03-27-2016)  . Seasonal allergies   . Urgency of urination     Past Surgical History:  Procedure Laterality Date  . ANKLE ARTHROSCOPY Right 02-13-2007    dr [name]   w/ debridement and removal loose body  . COLONOSCOPY  last one summer 2019  . INGUINAL HERNIA REPAIR Right 1980s  . INGUINAL LYMPH NODE BIOPSY  2001  approx.   benign  . TRANSURETHRAL RESECTION OF BLADDER TUMOR N/A 02/06/2019   Procedure: TRANSURETHRAL RESECTION OF BLADDER  TUMOR (TURBT);  Surgeon: Lucas Mallow, MD;  [facility];  Service: Urology;  Laterality: N/A;  . TRANSURETHRAL RESECTION OF BLADDER TUMOR N/A 02/20/2019   Procedure: TRANSURETHRAL RESECTION OF BLADDER TUMOR (TURBT);  Surgeon: Lucas Mallow, MD;  [facility];  Service: Urology;  Laterality: N/A;  . TRANSURETHRAL RESECTION OF BLADDER TUMOR N/A 03/13/2019   Procedure: TRANSURETHRAL RESECTION OF BLADDER TUMOR (TURBT);  Surgeon: Lucas Mallow, MD;  [facility];  Service: Urology;  Laterality: N/A;  . TRANSURETHRAL RESECTION OF BLADDER TUMOR WITH MITOMYCIN-C N/A 09/23/2019   Procedure: TRANSURETHRAL RESECTION OF BLADDER TUMOR WITH GEMCITABINE INSTILLATION INTO THE BLADDER;  Surgeon: Lucas Mallow, MD;  Location: [facility];  Service: Urology;  Laterality: N/A;  . TRANSURETHRAL RESECTION OF BLADDER TUMOR WITH MITOMYCIN-C N/A 06/24/2020   Procedure: TRANSURETHRAL RESECTION OF BLADDER TUMOR WITH POST OPERATIVE GEMCITABINE INSTILLATION;  Surgeon: Lucas Mallow, MD;  Location: [facility];  Service: Urology;  Laterality: N/A;    There were no vitals filed for this visit.   Subjective Assessment - 08/03/20 0909    Subjective Covid-19 screen performed prior to pt entering clinic. Patient reports he is to have surgery on 08/17/2020 and to  cotinue PT until surgery. Doing well today.    Pertinent History Bladder cancer, right ankle surgery.    How long can you stand comfortably? Varies.    How long can you walk comfortably? Varies.    Patient Stated Goals Improve strength prior to surgery.    Currently in Pain? Yes    Pain Score 3     Pain Location Hip    Pain Orientation Right;Left    Pain Descriptors / Indicators Sore    Pain Type Chronic pain    Pain Onset More than a month ago    Pain Frequency Constant    Aggravating Factors  walking and standing    Pain Relieving Factors sitting at rest                              [facility] Adult PT Treatment/Exercise - 08/03/20 0001      Lumbar Exercises: Aerobic   Nustep L7 x74min      Lumbar Exercises: Machines for Strengthening   Cybex Lumbar Extension 70# x50 reps    Other Lumbar Machine Exercise 100# 2 x fatigue      Lumbar Exercises: Supine   Bridge with clamshell 20 reps;3 seconds   green tband   Other Supine Lumbar Exercises hip bridge w green swiss 2x10      Knee/Hip Exercises: Stretches   ITB Stretch Both;1 rep;10 seconds    ITB Stretch Limitations demo for hEP      Knee/Hip Exercises: Machines for Strengthening   Cybex Knee Extension 20# 3x10    Cybex Knee Flexion 30# 3x10      Knee/Hip Exercises: Standing   Hip Extension Stengthening;Both;2 sets;10 reps      Knee/Hip Exercises: Sidelying   Hip ABduction Both;3 sets;10 reps;AAROM;AROM    Clams BLE 2x10 rps      Knee/Hip Exercises: Prone   Hamstring Curl 20 reps;1 set   BIL   Hip Extension Strengthening;Both;1 set;10 reps;Limitations   weakness                      PT Long Term Goals - 07/15/20 1125      PT LONG TERM GOAL #1   Title Indepedent with a HEP and progression.    Status Achieved                 Plan - 08/03/20 0950    Clinical Impression Statement Patient tolerated treatment well today. Patient able to progress with bil LE strengthening and stretches in preparation for his back surgery in two weeks. Patient continues to have discomfort in bil hip yet able to progress with all exercises. Today bil hip ext very weak with prone and able to better perform in standing. Patient is to cont therapy until surgery.    Personal Factors and Comorbidities Comorbidity 1;Comorbidity 2    Examination-Activity Limitations Locomotion Level;Other    Examination-Participation Restrictions Other    Stability/Clinical Decision Making Evolving/Moderate complexity    Rehab Potential Fair    PT Frequency 2x / week    PT Duration 6 weeks     PT Treatment/Interventions ADLs/Self Care Home Management;Cryotherapy;Electrical Stimulation;Ultrasound;Moist Heat;Therapeutic activities;Therapeutic exercise;Manual techniques;Patient/family education;Passive range of motion    PT Next Visit Plan Comprehensive spinal/core stregthening program.  .    Consulted and Agree with Plan of Care Patient           Patient will benefit from skilled therapeutic intervention in order  to improve the following deficits and impairments:  Abnormal gait, Decreased activity tolerance, Decreased balance, Decreased coordination, Decreased range of motion, Decreased strength, Increased muscle spasms, Impaired tone, Postural dysfunction, Pain  Visit Diagnosis: Chronic bilateral low back pain with bilateral sciatica  Abnormal posture  Other abnormalities of gait and mobility     Problem List Patient Active Problem List   Diagnosis Date Noted  . Bladder tumor 02/06/2019  . Arthritis of right ankle 07/15/2015  . Septic arthritis of right ankle ([place]) 07/15/2015  . MSSA (methicillin susceptible Staphylococcus aureus) infection 07/15/2015  . Ankle fracture, right 07/15/2015  . Traumatic osteoarthritis of right ankle 07/15/2015  . Dysphagia, unspecified(787.20) 04/30/2014  . Heartburn 04/30/2014  . Shoulder impingement syndrome   . GERD (gastroesophageal reflux disease)     Minha Fulco P, PTA 08/03/2020, 9:53 AM  [facility] [place], [place], [address] Phone: [phone]   Fax:  [phone]  Name: Jeremy Day MRN: [id number] Date of Birth: [birth year]

## 2020-08-04 ENCOUNTER — Telehealth: Payer: Self-pay | Admitting: *Deleted

## 2020-08-04 NOTE — Telephone Encounter (Signed)
He has too much going on today and will call back next wk to r/s -please cx 08/06/20

## 2020-08-05 DIAGNOSIS — Z85828 Personal history of other malignant neoplasm of skin: Secondary | ICD-10-CM | POA: Diagnosis not present

## 2020-08-05 DIAGNOSIS — L814 Other melanin hyperpigmentation: Secondary | ICD-10-CM | POA: Diagnosis not present

## 2020-08-05 DIAGNOSIS — L738 Other specified follicular disorders: Secondary | ICD-10-CM | POA: Diagnosis not present

## 2020-08-05 DIAGNOSIS — L821 Other seborrheic keratosis: Secondary | ICD-10-CM | POA: Diagnosis not present

## 2020-08-05 DIAGNOSIS — D225 Melanocytic nevi of trunk: Secondary | ICD-10-CM | POA: Diagnosis not present

## 2020-08-05 DIAGNOSIS — L400 Psoriasis vulgaris: Secondary | ICD-10-CM | POA: Diagnosis not present

## 2020-08-05 DIAGNOSIS — L570 Actinic keratosis: Secondary | ICD-10-CM | POA: Diagnosis not present

## 2020-08-06 ENCOUNTER — Encounter: Payer: Self-pay | Admitting: *Deleted

## 2020-08-13 DIAGNOSIS — Z23 Encounter for immunization: Secondary | ICD-10-CM | POA: Diagnosis not present

## 2020-08-13 DIAGNOSIS — Z5181 Encounter for therapeutic drug level monitoring: Secondary | ICD-10-CM | POA: Diagnosis not present

## 2020-08-13 DIAGNOSIS — E782 Mixed hyperlipidemia: Secondary | ICD-10-CM | POA: Diagnosis not present

## 2020-08-13 DIAGNOSIS — Z0000 Encounter for general adult medical examination without abnormal findings: Secondary | ICD-10-CM | POA: Diagnosis not present

## 2020-08-13 DIAGNOSIS — R7303 Prediabetes: Secondary | ICD-10-CM | POA: Diagnosis not present

## 2020-09-30 DIAGNOSIS — C678 Malignant neoplasm of overlapping sites of bladder: Secondary | ICD-10-CM | POA: Diagnosis not present

## 2020-09-30 DIAGNOSIS — N401 Enlarged prostate with lower urinary tract symptoms: Secondary | ICD-10-CM | POA: Diagnosis not present

## 2020-09-30 DIAGNOSIS — R351 Nocturia: Secondary | ICD-10-CM | POA: Diagnosis not present

## 2020-10-07 ENCOUNTER — Other Ambulatory Visit: Payer: Self-pay | Admitting: Urology

## 2020-11-17 ENCOUNTER — Other Ambulatory Visit (HOSPITAL_COMMUNITY)
Admission: RE | Admit: 2020-11-17 | Discharge: 2020-11-17 | Disposition: A | Discharge status: Home / Self Care | Payer: PPO | Source: Ambulatory Visit | Attending: Urology | Admitting: Urology

## 2020-11-17 DIAGNOSIS — Z01812 Encounter for preprocedural laboratory examination: Secondary | ICD-10-CM | POA: Insufficient documentation

## 2020-11-17 DIAGNOSIS — Z20822 Contact with and (suspected) exposure to covid-19: Secondary | ICD-10-CM | POA: Insufficient documentation

## 2020-11-17 LAB — SARS CORONAVIRUS 2 (TAT 6-24 HRS): SARS Coronavirus 2: NEGATIVE

## 2020-11-18 ENCOUNTER — Encounter (HOSPITAL_BASED_OUTPATIENT_CLINIC_OR_DEPARTMENT_OTHER): Payer: Self-pay | Admitting: Urology

## 2020-11-18 ENCOUNTER — Other Ambulatory Visit: Payer: Self-pay

## 2020-11-18 NOTE — Progress Notes (Signed)
Spoke w/ via phone for pre-op interview---pt Lab needs dos---- I stat              COVID test ------11-17-2020 at 1000 Arrive at -------915 am 11-20-2020 NPO after MN NO Solid Food.  Clear liquids from MN until---915 an then npo Medications to take morning of surgery -----famotidine, escitalopram Diabetic medication -----n/a Patient Special Instructions -----none Pre-Op special Istructions -----none Patient verbalized understanding of instructions that were given at this phone interview. Patient denies shortness of breath, chest pain, fever, cough at this phone interview.  Anesthesia Review:hx oc [facility]'s dilated aortic root  PCP:dr carol webb Cardiologist :dr Marlou Porch lov 10-16-2019 epic follow up in 2 years Chest x-ray : EKG :none8-18-2021 epic Echo :10-29-2019 epic Stress test:none Cardiac Cath : none Activity level: bad back arthritis using 2 canes to walk Sleep Study/ CPAP :none Fasting Blood Sugar :      / Checks Blood Sugar -- times a day:  n/a Blood Thinner/ Instructions /Last Dose:n/a ASA / Instructions/ Last Dose : n/a

## 2020-11-20 ENCOUNTER — Encounter (HOSPITAL_BASED_OUTPATIENT_CLINIC_OR_DEPARTMENT_OTHER)
Admission: RE | Disposition: A | Discharge status: Home / Self Care | Payer: Self-pay | Source: Home / Self Care | Attending: Urology

## 2020-11-20 ENCOUNTER — Other Ambulatory Visit: Payer: Self-pay

## 2020-11-20 ENCOUNTER — Encounter (HOSPITAL_BASED_OUTPATIENT_CLINIC_OR_DEPARTMENT_OTHER): Payer: Self-pay | Admitting: Urology

## 2020-11-20 ENCOUNTER — Ambulatory Visit (HOSPITAL_BASED_OUTPATIENT_CLINIC_OR_DEPARTMENT_OTHER): Payer: PPO | Admitting: Certified Registered"

## 2020-11-20 ENCOUNTER — Ambulatory Visit (HOSPITAL_BASED_OUTPATIENT_CLINIC_OR_DEPARTMENT_OTHER)
Admission: RE | Admit: 2020-11-20 | Discharge: 2020-11-20 | Disposition: A | Discharge status: Home / Self Care | Payer: PPO | Attending: Urology | Admitting: Urology

## 2020-11-20 DIAGNOSIS — C678 Malignant neoplasm of overlapping sites of bladder: Secondary | ICD-10-CM | POA: Diagnosis not present

## 2020-11-20 DIAGNOSIS — N401 Enlarged prostate with lower urinary tract symptoms: Secondary | ICD-10-CM | POA: Insufficient documentation

## 2020-11-20 DIAGNOSIS — Z87891 Personal history of nicotine dependence: Secondary | ICD-10-CM | POA: Insufficient documentation

## 2020-11-20 DIAGNOSIS — Z791 Long term (current) use of non-steroidal anti-inflammatories (NSAID): Secondary | ICD-10-CM | POA: Diagnosis not present

## 2020-11-20 DIAGNOSIS — D494 Neoplasm of unspecified behavior of bladder: Secondary | ICD-10-CM

## 2020-11-20 DIAGNOSIS — Z79899 Other long term (current) drug therapy: Secondary | ICD-10-CM | POA: Diagnosis not present

## 2020-11-20 DIAGNOSIS — N3289 Other specified disorders of bladder: Secondary | ICD-10-CM | POA: Diagnosis not present

## 2020-11-20 DIAGNOSIS — K219 Gastro-esophageal reflux disease without esophagitis: Secondary | ICD-10-CM | POA: Diagnosis not present

## 2020-11-20 DIAGNOSIS — M1990 Unspecified osteoarthritis, unspecified site: Secondary | ICD-10-CM | POA: Diagnosis not present

## 2020-11-20 DIAGNOSIS — M19071 Primary osteoarthritis, right ankle and foot: Secondary | ICD-10-CM | POA: Diagnosis not present

## 2020-11-20 DIAGNOSIS — C679 Malignant neoplasm of bladder, unspecified: Secondary | ICD-10-CM | POA: Diagnosis not present

## 2020-11-20 HISTORY — DX: Meningitis, unspecified: G03.9

## 2020-11-20 HISTORY — PX: TRANSURETHRAL RESECTION OF BLADDER TUMOR: SHX2575

## 2020-11-20 LAB — POCT I-STAT, CHEM 8
BUN: 10 mg/dL (ref 8–23)
Calcium, Ion: 1.29 mmol/L (ref 1.15–1.40)
Chloride: 101 mmol/L (ref 98–111)
Creatinine, Ser: 0.8 mg/dL (ref 0.61–1.24)
Glucose, Bld: 96 mg/dL (ref 70–99)
HCT: 50 % (ref 39.0–52.0)
Hemoglobin: 17 g/dL (ref 13.0–17.0)
Potassium: 3.9 mmol/L (ref 3.5–5.1)
Sodium: 139 mmol/L (ref 135–145)
TCO2: 26 mmol/L (ref 22–32)

## 2020-11-20 SURGERY — TURBT (TRANSURETHRAL RESECTION OF BLADDER TUMOR)
Anesthesia: General | Site: Bladder

## 2020-11-20 MED ORDER — GEMCITABINE CHEMO FOR BLADDER INSTILLATION 2000 MG: 2000.0000 mg | Freq: Once | INTRAVENOUS | Status: DC

## 2020-11-20 MED ORDER — PROPOFOL 10 MG/ML IV BOLUS
INTRAVENOUS | Status: DC | PRN
  Administered 2020-11-20: 150 mg via INTRAVENOUS

## 2020-11-20 MED ORDER — GLYCOPYRROLATE PF 0.2 MG/ML IJ SOSY
PREFILLED_SYRINGE | INTRAMUSCULAR | Status: DC | PRN
  Administered 2020-11-20: .2 mg via INTRAVENOUS

## 2020-11-20 MED ORDER — ACETAMINOPHEN 160 MG/5ML PO SOLN: 325.0000 mg | ORAL | Status: DC | PRN

## 2020-11-20 MED ORDER — GEMCITABINE CHEMO FOR BLADDER INSTILLATION 2000 MG
2000.0000 mg | Freq: Once | INTRAVENOUS | Status: AC
  Administered 2020-11-20: 2000 mg via INTRAVESICAL
  Filled 2020-11-20: qty 2000

## 2020-11-20 MED ORDER — KETOROLAC TROMETHAMINE 15 MG/ML IJ SOLN: 15.0000 mg | Freq: Once | INTRAMUSCULAR | Status: DC

## 2020-11-20 MED ORDER — DEXAMETHASONE SODIUM PHOSPHATE 10 MG/ML IJ SOLN
INTRAMUSCULAR | Status: DC | PRN
  Administered 2020-11-20: 10 mg via INTRAVENOUS

## 2020-11-20 MED ORDER — MEPERIDINE HCL 25 MG/ML IJ SOLN: 6.2500 mg | INTRAMUSCULAR | Status: DC | PRN

## 2020-11-20 MED ORDER — OXYCODONE-ACETAMINOPHEN 5-325 MG PO TABS: 1.0000 | ORAL_TABLET | ORAL | 0 refills | Status: DC | PRN

## 2020-11-20 MED ORDER — ONDANSETRON HCL 4 MG/2ML IJ SOLN
INTRAMUSCULAR | Status: DC | PRN
  Administered 2020-11-20: 4 mg via INTRAVENOUS

## 2020-11-20 MED ORDER — OXYCODONE HCL 5 MG/5ML PO SOLN: 5.0000 mg | Freq: Once | ORAL | Status: DC | PRN

## 2020-11-20 MED ORDER — FENTANYL CITRATE (PF) 100 MCG/2ML IJ SOLN
INTRAMUSCULAR | Status: AC
  Filled 2020-11-20: qty 2

## 2020-11-20 MED ORDER — FENTANYL CITRATE (PF) 250 MCG/5ML IJ SOLN
INTRAMUSCULAR | Status: DC | PRN
  Administered 2020-11-20 (×2): 25 ug via INTRAVENOUS

## 2020-11-20 MED ORDER — CEFAZOLIN SODIUM-DEXTROSE 2-4 GM/100ML-% IV SOLN
INTRAVENOUS | Status: AC
  Filled 2020-11-20: qty 100

## 2020-11-20 MED ORDER — ACETAMINOPHEN 325 MG PO TABS: 325.0000 mg | ORAL_TABLET | ORAL | Status: DC | PRN

## 2020-11-20 MED ORDER — LIDOCAINE 2% (20 MG/ML) 5 ML SYRINGE
INTRAMUSCULAR | Status: DC | PRN
  Administered 2020-11-20: 50 mg via INTRAVENOUS

## 2020-11-20 MED ORDER — ONDANSETRON HCL 4 MG/2ML IJ SOLN
INTRAMUSCULAR | Status: AC
  Filled 2020-11-20: qty 2

## 2020-11-20 MED ORDER — LACTATED RINGERS IV SOLN: INTRAVENOUS | Status: DC

## 2020-11-20 MED ORDER — SODIUM CHLORIDE 0.9 % IR SOLN
Status: DC | PRN
  Administered 2020-11-20: 3000 mL via INTRAVESICAL

## 2020-11-20 MED ORDER — ONDANSETRON HCL 4 MG/2ML IJ SOLN: 4.0000 mg | Freq: Once | INTRAMUSCULAR | Status: DC | PRN

## 2020-11-20 MED ORDER — OXYCODONE HCL 5 MG PO TABS: 5.0000 mg | ORAL_TABLET | Freq: Once | ORAL | Status: DC | PRN

## 2020-11-20 MED ORDER — PROPOFOL 10 MG/ML IV BOLUS
INTRAVENOUS | Status: AC
  Filled 2020-11-20: qty 20

## 2020-11-20 MED ORDER — CEFAZOLIN SODIUM-DEXTROSE 2-4 GM/100ML-% IV SOLN
2.0000 g | Freq: Once | INTRAVENOUS | Status: AC
  Administered 2020-11-20: 2 g via INTRAVENOUS

## 2020-11-20 MED ORDER — LIDOCAINE HCL (PF) 2 % IJ SOLN
INTRAMUSCULAR | Status: AC
  Filled 2020-11-20: qty 5

## 2020-11-20 MED ORDER — DEXAMETHASONE SODIUM PHOSPHATE 10 MG/ML IJ SOLN
INTRAMUSCULAR | Status: AC
  Filled 2020-11-20: qty 1

## 2020-11-20 MED ORDER — DOCUSATE SODIUM 100 MG PO CAPS: 100.0000 mg | ORAL_CAPSULE | Freq: Every day | ORAL | 0 refills | Status: DC | PRN

## 2020-11-20 MED ORDER — FENTANYL CITRATE (PF) 100 MCG/2ML IJ SOLN
25.0000 ug | INTRAMUSCULAR | Status: DC | PRN
Start: 2020-11-20 — End: 2020-11-20
  Administered 2020-11-20: 25 ug via INTRAVENOUS

## 2020-11-20 SURGICAL SUPPLY — 31 items
BAG DRAIN URO-CYSTO SKYTR STRL (DRAIN) ×2 IMPLANT
BAG DRN RND TRDRP ANRFLXCHMBR (UROLOGICAL SUPPLIES) ×1
BAG DRN UROCATH (DRAIN) ×1
BAG URINE DRAIN 2000ML AR STRL (UROLOGICAL SUPPLIES) ×1 IMPLANT
BAG URINE LEG 500ML (DRAIN) IMPLANT
CATH FOLEY 2WAY SLVR  5CC 18FR (CATHETERS) ×2
CATH FOLEY 2WAY SLVR  5CC 22FR (CATHETERS)
CATH FOLEY 2WAY SLVR 30CC 20FR (CATHETERS) IMPLANT
CATH FOLEY 2WAY SLVR 5CC 18FR (CATHETERS) IMPLANT
CATH FOLEY 2WAY SLVR 5CC 22FR (CATHETERS) IMPLANT
CATH SET URETHRAL DILATOR (CATHETERS) IMPLANT
CATH URET 5FR 28IN OPEN ENDED (CATHETERS) IMPLANT
CLOTH BEACON ORANGE TIMEOUT ST (SAFETY) ×1 IMPLANT
ELECT REM PT RETURN 9FT ADLT (ELECTROSURGICAL)
ELECTRODE REM PT RTRN 9FT ADLT (ELECTROSURGICAL) ×1 IMPLANT
EVACUATOR MICROVAS BLADDER (UROLOGICAL SUPPLIES) IMPLANT
GLOVE BIO SURGEON STRL SZ7 (GLOVE) ×1 IMPLANT
GLOVE BIOGEL PI IND STRL 7.5 (GLOVE) IMPLANT
GLOVE BIOGEL PI INDICATOR 7.5 (GLOVE) ×1
GLOVE ECLIPSE 7.5 STRL STRAW (GLOVE) ×1 IMPLANT
GLOVE SURG ENC MOIS LTX SZ7 (GLOVE) IMPLANT
GOWN STRL REUS W/ TWL LRG LVL3 (GOWN DISPOSABLE) ×1 IMPLANT
GOWN STRL REUS W/TWL LRG LVL3 (GOWN DISPOSABLE) ×2 IMPLANT
GUIDEWIRE ZIPWRE .038 STRAIGHT (WIRE) IMPLANT
KIT TURNOVER CYSTO (KITS) ×2 IMPLANT
LOOP CUT BIPOLAR 24F LRG (ELECTROSURGICAL) ×1 IMPLANT
MANIFOLD NEPTUNE II (INSTRUMENTS) ×1 IMPLANT
PACK CYSTO (CUSTOM PROCEDURE TRAY) ×2 IMPLANT
SYR TOOMEY IRRIG 70ML (MISCELLANEOUS)
SYRINGE TOOMEY IRRIG 70ML (MISCELLANEOUS) IMPLANT
TUBE CONNECTING 12X1/4 (SUCTIONS) ×1 IMPLANT

## 2020-11-20 NOTE — Transfer of Care (Signed)
Immediate Anesthesia Transfer of Care Note  Patient: Jeremy Day  Procedure(s) Performed: TRANSURETHRAL RESECTION OF BLADDER TUMOR (TURBT) WITH INSTILLATION OF post-op GEMCITABINE (N/A Bladder)  Patient Location: PACU  Anesthesia Type:General  Level of Consciousness: awake, alert , oriented and patient cooperative  Airway & Oxygen Therapy: Patient Spontanous Breathing and Patient connected to face mask  Post-op Assessment: Report given to RN, Post -op Vital signs reviewed and stable and Patient moving all extremities  Post vital signs: Reviewed and stable  Last Vitals:  Vitals Value Taken Time  BP 145/95 11/20/20 1248  Temp    Pulse 62 11/20/20 1252  Resp 13 11/20/20 1252  SpO2 98 % 11/20/20 1252  Vitals shown include unvalidated device data.  Last Pain:  Vitals:   11/20/20 1014  TempSrc: Oral         Complications: No complications documented.

## 2020-11-20 NOTE — Op Note (Signed)
Operative Note  Preoperative diagnosis:  1.  Bladder cancer  Postoperative diagnosis: 1.  Bladder cancer  Procedure(s): 1.  TURBT small 2. Instillation of gemcitabine  Surgeon: Rexene Alberts, MD  Assistants:  None  Anesthesia:  General  Complications:  None  EBL:  minimal  Specimens: 1.  ID Type Source Tests Collected by Time Destination  1 : bladder tumor Tissue PATH GU resection / TURBT / partial nephrectomy SURGICAL PATHOLOGY Janith Lima, MD 11/20/2020 1230     Drains/Catheters: 1.  18 Fr foley  Intraoperative findings:   1. 2 separate lesions along right lateral wall which appeared to be calcified necrosis. No papillary lesions 2. Successfully resected. 3. Excellent hemostasis  Number of tumors:        2 Size of largest tumor:      1cm  Characteristics of tumors:   Nodular   Recurrent  Yes         Primary  No  Suspicious for Carcinoma in situ: No  Clinical tumor stage   cTa     Bimanual exam under anesthesia:      Yes  Visually complete resection:                 Yes  Visualization of detrusor muscle in resection base:      Yes   Visual evaluation for perforation:          Yes (none)   Indication:  Jeremy Day is a 70 y.o. male with a history of HG T1 UCB. Cystoscopy 09/30/2020: Evidence of necrosis from prior resection site and posterior bladder wall, unable to rule out recurrent malignancy. All the risks, benefits were discussed with the patient to include but not limited to infection, pain, bleeding, damage to adjacent structures, need for further operations, adverse reaction to anesthesia and death.  Patient understands these risks and agrees to proceed with the operation as planned.    Description of procedure: After informed consent was obtained from the patient, the patient was taken to the operating room. General anesthesia was administered. The patient was placed in dorsal lithotomy position and prepped and draped in usual sterile fashion.  Sequential compression devices were applied to lower extremities at the beginning of the case for DVT prophylaxis. Antibiotics were infused prior to surgery start time. A surgical time-out was performed to properly identify the patient, the surgery to be performed, and the surgical site.     We then passed the 21-French rigid cystoscope down the urethra and into the bladder under direct vision without any difficulty. The anterior urethral was normal. The prostate was non-obstructing. The bladder was inspected with 30 and 70 degree lenses. Once in the bladder, systematic evaluation of bladder revealed 2 lesions on the posterior lateral wall which appeared to be calcified necrosis. No areas of papillary lesions. These were nodular lesions. The right ureteral orfices was displaced laterally. The left UO was displaced medially near the floor of the tigone. Both UOs were not involved.   We then removed the cystoscope and then passed down the 26 French resectoscope sheath down the urethra into the bladder under direct vision with the visual obturator. The tumor was resected down to muscle. The TUR bladder tumor chips were retrieved from the bladder and each region of resection was passed off the field as a specimen.  Hemostasis was achieved using electrocautery. We then proceeded with removing the resectoscope and then placed in a 18Foley catheter. The patient tolerated the procedure well with  no complication and was awoken from anesthesia and taken to recovery in stable condition.     While in the post-operative care unit, as a separate procedure, 2000 mg of gemcitabine in 50 mL of water was instilled in the bladder through the catheter and the catheter was plugged. This will remain indwelling for approximately one hour. It will then be drained from the bladder and the catheter will be removed and the patient discharged home.  Plan:  F/u next week in office.  [facility]  Pager:  [phone]

## 2020-11-20 NOTE — Anesthesia Postprocedure Evaluation (Signed)
Anesthesia Post Note  Patient: Jeremy Day  Procedure(s) Performed: TRANSURETHRAL RESECTION OF BLADDER TUMOR (TURBT) WITH INSTILLATION OF post-op GEMCITABINE (N/A Bladder)     Patient location during evaluation: PACU Anesthesia Type: General Level of consciousness: awake and sedated Pain management: pain level controlled Vital Signs Assessment: post-procedure vital signs reviewed and stable Respiratory status: spontaneous breathing Cardiovascular status: stable Postop Assessment: no apparent nausea or vomiting Anesthetic complications: no   No complications documented.  Last Vitals:  Vitals:   11/20/20 1249 11/20/20 1300  BP: (!) 145/95 (!) 139/100  Pulse: (!) 58 61  Resp: (!) 7 14  Temp: (!) 36.4 C   SpO2: 99% 92%    Last Pain:  Vitals:   11/20/20 1249  TempSrc:   PainSc: 0-No pain   Pain Goal:                   Huston Foley

## 2020-11-20 NOTE — Anesthesia Procedure Notes (Signed)
Procedure Name: LMA Insertion Date/Time: 11/20/2020 12:14 PM Performed by: Myna Bright, CRNA Pre-anesthesia Checklist: Patient identified, Emergency Drugs available, Suction available and Patient being monitored Patient Re-evaluated:Patient Re-evaluated prior to induction Oxygen Delivery Method: Circle system utilized Preoxygenation: Pre-oxygenation with 100% oxygen Induction Type: IV induction Ventilation: Mask ventilation without difficulty LMA: LMA inserted LMA Size: 4.0 Number of attempts: 1 Placement Confirmation: positive ETCO2 and breath sounds checked- equal and bilateral Tube secured with: Tape Dental Injury: Teeth and Oropharynx as per pre-operative assessment

## 2020-11-20 NOTE — H&P (Signed)
Office Visit Report     09/30/2020   --------------------------------------------------------------------------------   Jeremy Day  MRN: [id number]  DOB: 01-29-1951, 70 year old Male  SSN:    PRIMARY CARE:  [name], MD  REFERRING:  [name], MD  PROVIDER:  Link Snuffer, III, M.D.  TREATING:  Rexene Alberts, M.D.  LOCATION:  [facility] [phone]     --------------------------------------------------------------------------------   CC/HPI: CC: Bladder cancer  HPI:  01/24/2019  70 year old male presents with a primary complaint of urgency to urinate with low volume output. He also complains about mildly weak stream, nocturia 3 in difficulty postponing urination. AUA symptom score is 15. He denies a history of urinary tract infections except for once in the 1970s. He denies current hematuria or dysuria. Incidentally, he notes a 2 year history of intermittent gross hematuria. Most recently, the gross hematuria symptoms have increased and he had 3 episodes of gross hematuria last week. He has never had this worked up. He is asymptomatic in this regard. He started Flomax about 2 weeks ago and has not noticed much of a difference.   02/01/2019  Patient underwent a CT IVP. This revealed multiple filling defects in his bladder consistent with likely tumors. Kidney and ureter were normal. PSA at the last visit was normal.   02/11/19: s/p 1st TURBT on 02/06/19. 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 He tolerated well and returns today for pathology review and TOV. Urine has been clear. He has no complaints today. In the operating room, he was found have a large amount of tumor. It was not completely resectable with one surgery.   02/26/2019  Patient is status post his second TURBT on 02/20/2019. 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 He has been doing well with the catheter. Urine is cleared up. He greatly desires to avoid cystectomy. He does  understand that he has a large amount of tumor involvement and after 2 resections, I was not able to remove all the tumor. However, given no muscle involvement. He desires for me to try full resection endoscopically. 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   03/19/2019  Patient status post his third TURBT. He tolerated this well. Hematuria is resolving. 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 Muscle was present in the specimen. All tumor was resected. After this third TURBT.   08/23/2019  Patient completed BCG. He presents for cystoscopy. He denies interval gross hematuria.   10/02/2019  Patient is status post TURBT. This revealed superficial low-grade urothelial cell carcinoma. No evidence of high-grade cancer. He has had no further gross hematuria. No pain.   02/27/2020  Patient completed repeat induction BCG. He presents for cystoscopy. He does also complain about significant urinary frequency and urgency as well as nocturia 2-4 times a night. He is on tamsulosin.   05/27/2020  Patient presents for surveillance cystoscopy. He completed his maintenance BCG. His urinary urgency and frequency have improved. Nocturia is intermittent. It depends on the amount that he drinks. He is not taking Myrbetriq. It did not really help. He underwent a CT of the abdomen and pelvis, CT IVP. This revealed no evidence of upper tract malignancy.   07/02/2020  Patient underwent TURBT --small with instillation of gemcitabine. Pathology revealed early noninvasive low-grade urothelial cell carcinoma. He has no complaints today.   09/30/2020:  History of high risk nonmuscle invasive bladder cancer:  -TURBT history:  02/06/2019: High-grade T1 UCB (muscle present and uninvolved)  02/20/2019: High-grade T1 UCB (without muscle invasive)  03/2019: High-grade T1 UCB (without muscle invasive)  09/2019: Low-grade TA UCB   S/p induction BCG and repeat  induction BCG.  -Negative surveillance cystoscopy and 05/2020   -CT A/P 06/01/2020 NED   Cystoscopy 09/30/2020: Evidence of necrosis from prior resection site and posterior bladder wall, unable to rule out recurrent malignancy.   He denies bone pain or back pain. He has no hematuria. He has no significant lower urine tract symptoms.     ALLERGIES: None   MEDICATIONS: Anti-Depresssant  Calcium Carbonate  Clobetasol Propionate PRN  Diclofenac Sodium  Diphenhydramine Hcl 25 mg capsule capsule PRN  Famotidine 20 mg tablet  Mometasone Furoate PRN  Oxycodone-Acetaminophen PRN  Pimecrolimus 1 % cream PRN     GU PSH: Bladder Instill AntiCA Agent - 06/24/2020, 03/23/2020, 03/16/2020, 03/09/2020, 11/25/2019, 11/18/2019, 11/11/2019, 11/04/2019, 10/28/2019, 10/21/2019, 09/23/2019, 05/14/2019, 05/07/2019, 04/30/2019, 04/23/2019, 04/16/2019, 04/09/2019 Cystoscopy - 06/02/2020, 02/27/2020, 08/23/2019, 2020 Cystoscopy TURBT <2 cm - 06/24/2020 Cystoscopy TURBT >5 cm - 2020, 2020, 2020 Cystoscopy TURBT 2-5 cm - 09/23/2019 Locm 300-399Mg /Ml Iodine,1Ml - 06/01/2020, 2020     NON-GU PSH: None   GU PMH: Nocturia (Stable) - 02/27/2020, - 2020 Urinary Frequency - 02/27/2020 Bladder Cancer overlapping sites, HGT1 bladder cancer Catheter removed today We discussed proceeding with repeat TURBT for confirmatory staging and residual tumor resection. - 2020 Bladder tumor/neoplasm - 2020 BPH w/LUTS - 2020 Encounter for Prostate Cancer screening - 2020 Gross hematuria - 2020 Urinary Urgency - 2020 Weak Urinary Stream - 2020    NON-GU PMH: None   FAMILY HISTORY: None   SOCIAL HISTORY: Marital Status: Married    REVIEW OF SYSTEMS:    GU Review Male:   Patient denies frequent urination, hard to postpone urination, burning/ pain with urination, get up at night to urinate, leakage of urine, stream starts and stops, trouble starting your stream, have to strain to urinate , erection problems, and penile pain.  Gastrointestinal  (Upper):   Patient denies nausea, vomiting, and indigestion/ heartburn.  Gastrointestinal (Lower):   Patient denies diarrhea and constipation.  Constitutional:   Patient denies fever, night sweats, weight loss, and fatigue.  Skin:   Patient denies skin rash/ lesion and itching.  Eyes:   Patient denies blurred vision and double vision.  Ears/ Nose/ Throat:   Patient denies sore throat and sinus problems.  Hematologic/Lymphatic:   Patient denies swollen glands and easy bruising.  Cardiovascular:   Patient denies leg swelling and chest pains.  Respiratory:   Patient denies cough and shortness of breath.  Endocrine:   Patient denies excessive thirst.  Musculoskeletal:   Patient denies back pain and joint pain.  Neurological:   Patient denies headaches and dizziness.  Psychologic:   Patient denies depression and anxiety.   VITAL SIGNS:      09/30/2020 09:09 AM  Weight 190 lb / 86.18 kg  Height 72 in / 182.88 cm  BP 121/80 mmHg  Pulse 59 /min  Temperature 97.1 F / 36.1 C  BMI 25.8 kg/m   MULTI-SYSTEM PHYSICAL EXAMINATION:    Constitutional: Well-nourished. No physical deformities. Normally developed. Good grooming.  Respiratory: No labored breathing, no use of accessory muscles.   Cardiovascular: Normal temperature, normal extremity pulses, no swelling, no varicosities.  Gastrointestinal: No mass, no tenderness, no rigidity, non obese abdomen.     Complexity of Data:  Source Of History:  Patient, Medical Record Summary  Records Review:   AUA Symptom Score, Pathology Reports, Previous Doctor  Records, Previous Hospital Records  Urine Test Review:   Urinalysis  X-Ray Review: C.T. Abdomen/Pelvis: Reviewed Films. Reviewed Report.     01/24/19  PSA  Total PSA 2.93 ng/mL    PROCEDURES:         Flexible Cystoscopy - 52000  Risks, benefits, and some of the potential complications of the procedure were discussed at length with the patient including infection, bleeding, voiding discomfort,  urinary retention, fever, chills, sepsis, and others. All questions were answered. Informed consent was obtained. Antibiotic prophylaxis was given. Sterile technique and intraurethral analgesia were used.  Meatus:  Normal size. Normal location. Normal condition.  Urethra:  No strictures.  External Sphincter:  Normal.  Verumontanum:  Normal.  Prostate:  Non-obstructing. No hyperplasia.  Bladder Neck:  Non-obstructing.  Ureteral Orifices:  Normal location. Normal size. Normal shape. Effluxed clear urine.  Bladder:  Necrotic lesion about 2cm on posterior right aspect of bladder, unable to rule out malignancy      The lower urinary tract was carefully examined. The procedure was well-tolerated and without complications. Antibiotic instructions were given. Instructions were given to call the office immediately for bloody urine, difficulty urinating, urinary retention, painful or frequent urination, fever, chills, nausea, vomiting or other illness. The patient stated that he understood these instructions and would comply with them.         Urinalysis w/Scope Dipstick Dipstick Cont'd Micro  Color: Amber Bilirubin: Neg mg/dL WBC/hpf: 0 - 5/hpf  Appearance: Clear Ketones: 1+ mg/dL RBC/hpf: 3 - 10/hpf  Specific Gravity: 1.025 Blood: Neg ery/uL Bacteria: Rare (0-9/hpf)  pH: 6.0 Protein: Trace mg/dL Cystals: NS (Not Seen)  Glucose: Neg mg/dL Urobilinogen: 1.0 mg/dL Casts: NS (Not Seen)    Nitrites: Neg Trichomonas: Not Present    Leukocyte Esterase: 1+ leu/uL Mucous: Present      Epithelial Cells: 0 - 5/hpf      Yeast: NS (Not Seen)      Sperm: Not Present    ASSESSMENT:      ICD-10 Details  1 GU:   Bladder Cancer overlapping sites - C67.8   2   BPH w/LUTS - N40.1    PLAN:           Orders Labs Urine Cytology          Document Letter(s):  Created for Patient: Clinical Summary         Notes:   1. High risk nonmuscle invasive bladder cancer   -TURBT history:  02/06/2019: High-grade T1  UCB (muscle present and uninvolved)  02/20/2019: High-grade T1 UCB (without muscle invasive)  03/2019: High-grade T1 UCB (without muscle invasive)  09/2019: Low-grade TA UCB   S/p induction BCG and repeat induction BCG.  -Negative surveillance cystoscopy and 05/2020   -CT A/P 06/01/2020 NED   Cystoscopy 09/30/2020: Evidence of necrosis from prior resection site and posterior bladder wall, unable to rule out recurrent malignancy.   -We will arrange for TURBT with instillation of gemcitabine.   CC: [place], MD     Signed by Rexene Alberts, M.D. on 09/30/20 at 9:23 AM (EST)  Urology Preoperative H&P   Chief Complaint: Bladder cancer  History of Present Illness: TERYL MCCONAGHY is a 70 y.o. male with bladder mass on prior resection site here for TURBT. Denies fevers or chills.    Past Medical History:  Diagnosis Date  . Abnormal tandem walk    using 2 canes due to lower back arthritis  . Ascending aorta dilatation (HCC)    echo  04-11-2016  62mm and dilated aortc root 66mm  . Bladder tumor   . BPH (benign prostatic hyperplasia)   . Cancer ([place]) 2020   bladder  . Depression   . Diverticulosis   . Full dentures   . GERD (gastroesophageal reflux disease)   . Hiatal hernia   . History of colon polyps   . History of staphylococcal infection 2008   right ankle  w/ sepsis  . Meningitis spinal age 30 or 36  . OA (osteoarthritis)    lower back, right ankle,, bilateral knees and shouldres  . Pneumonia yrs ago  . Pre-diabetes   . Psoriasis   . PVC's (premature ventricular contractions)    2012-- hx bigeminy/ trigeminy with near syncope (cardiology consult note in epic dated 2012)  (last cardiology visit in epic w/ dr Marlou Porch, 03-27-2016)  . Seasonal allergies   . Urgency of urination     Past Surgical History:  Procedure Laterality Date  . ANKLE ARTHROSCOPY Right 02-13-2007    dr [name]   w/ debridement and removal loose body  . COLONOSCOPY  last one summer 2019  . INGUINAL HERNIA  REPAIR Right 1980s  . INGUINAL LYMPH NODE BIOPSY  2001  approx.   benign  . TRANSURETHRAL RESECTION OF BLADDER TUMOR N/A 02/06/2019   Procedure: TRANSURETHRAL RESECTION OF BLADDER TUMOR (TURBT);  Surgeon: Lucas Mallow, MD;  Location: [facility];  Service: Urology;  Laterality: N/A;  . TRANSURETHRAL RESECTION OF BLADDER TUMOR N/A 02/20/2019   Procedure: TRANSURETHRAL RESECTION OF BLADDER TUMOR (TURBT);  Surgeon: Lucas Mallow, MD;  Location: [facility];  Service: Urology;  Laterality: N/A;  . TRANSURETHRAL RESECTION OF BLADDER TUMOR N/A 03/13/2019   Procedure: TRANSURETHRAL RESECTION OF BLADDER TUMOR (TURBT);  Surgeon: Lucas Mallow, MD;  Location: [facility];  Service: Urology;  Laterality: N/A;  . TRANSURETHRAL RESECTION OF BLADDER TUMOR WITH MITOMYCIN-C N/A 09/23/2019   Procedure: TRANSURETHRAL RESECTION OF BLADDER TUMOR WITH GEMCITABINE INSTILLATION INTO THE BLADDER;  Surgeon: Lucas Mallow, MD;  Location: [facility];  Service: Urology;  Laterality: N/A;  . TRANSURETHRAL RESECTION OF BLADDER TUMOR WITH MITOMYCIN-C N/A 06/24/2020   Procedure: TRANSURETHRAL RESECTION OF BLADDER TUMOR WITH POST OPERATIVE GEMCITABINE INSTILLATION;  Surgeon: Lucas Mallow, MD;  Location: [facility];  Service: Urology;  Laterality: N/A;    Allergies: No Known Allergies  Family History  Problem Relation Age of Onset  . Other Father        low potassium     Social History:  reports that he quit smoking about 35 years ago. His smoking use included cigarettes. He has a 40.00 pack-year smoking history. He quit smokeless tobacco use about 23 years ago.  His smokeless tobacco use included snuff and chew. He reports previous drug use. He reports that he does not drink alcohol.  ROS: A complete review of systems was performed.  All systems are negative except for pertinent findings as noted.  Physical Exam:  Vital signs in last 24 hours: Temp:  [97.4 F (36.3 C)] 97.4 F  (36.3 C) (01/14 1014) Pulse Rate:  [56] 56 (01/14 1014) Resp:  [17] 17 (01/14 1014) BP: (124)/(88) 124/88 (01/14 1014) SpO2:  [97 %] 97 % (01/14 1014) Weight:  [86.9 kg] 86.9 kg (01/14 1014) Constitutional:  Alert and oriented, No acute distress Cardiovascular: Regular rate and rhythm Respiratory: Normal respiratory effort, Lungs clear bilaterally GI: Abdomen is soft, nontender, nondistended, no abdominal masses GU: No  CVA tenderness Lymphatic: No lymphadenopathy Neurologic: Grossly intact, no focal deficits Psychiatric: Normal mood and affect  Laboratory Data:  No results for input(s): WBC, HGB, HCT, PLT in the last 72 hours.  No results for input(s): NA, K, CL, GLUCOSE, BUN, CALCIUM, CREATININE in the last 72 hours.  Invalid input(s): CO3   No results found for this or any previous visit (from the past 24 hour(s)). Recent Results (from the past 240 hour(s))  SARS CORONAVIRUS 2 (TAT 6-24 HRS) Nasopharyngeal Nasopharyngeal Swab     Status: None   Collection Time: 11/17/20 10:51 AM   Specimen: Nasopharyngeal Swab  Result Value Ref Range Status   SARS Coronavirus 2 NEGATIVE NEGATIVE Final    Comment: (NOTE) SARS-CoV-2 target nucleic acids are NOT DETECTED.  The SARS-CoV-2 RNA is generally detectable in upper and lower respiratory specimens during the acute phase of infection. Negative results do not preclude SARS-CoV-2 infection, do not rule out co-infections with other pathogens, and should not be used as the sole basis for treatment or other patient management decisions. Negative results must be combined with clinical observations, patient history, and epidemiological information. The expected result is Negative.  Fact Sheet for Patients: SugarRoll.be  Fact Sheet for Healthcare Providers: https://www.woods-mathews.com/  This test is not yet approved or cleared by the [place] FDA and  has been authorized for detection  and/or diagnosis of SARS-CoV-2 by FDA under an Emergency Use Authorization (EUA). This EUA will remain  in effect (meaning this test can be used) for the duration of the COVID-19 declaration under Se ction 564(b)(1) of the Act, 21 U.S.C. section 360bbb-3(b)(1), unless the authorization is terminated or revoked sooner.  Performed at [facility], [place] [address] [place], [place] [address]     Renal Function: No results for input(s): CREATININE in the last 168 hours. CrCl cannot be calculated (Patient's most recent lab result is older than the maximum 21 days allowed.).  Radiologic Imaging: No results found.  I independently reviewed the above imaging studies.  Assessment and Plan VUE BEYLER is a 70 y.o. male with  bladder mass on prior resection site here for TURBT. Ok to proceed.  Matt R. Jaren Kearn MD 11/20/2020, 10:47 AM  [facility] Pager: [phone]): [phone]

## 2020-11-20 NOTE — Anesthesia Preprocedure Evaluation (Signed)
Anesthesia Evaluation  Patient identified by MRN, date of birth, ID band Patient awake    Reviewed: Allergy & Precautions, NPO status , Patient's Chart, lab work & pertinent test results  Airway Mallampati: I  TM Distance: >3 FB Neck ROM: Full    Dental  (+) Edentulous Upper, Edentulous Lower   Pulmonary former smoker,    Pulmonary exam normal        Cardiovascular negative cardio ROS Normal cardiovascular exam  Echo 10/29/19 Left Ventricle: Left ventricular ejection fraction, by visual estimation,  is 60 to 65%. The left ventricle has normal function. The left ventricle  has no regional wall motion abnormalities. There is mildly increased left  ventricular hypertrophy.  Asymmetric left ventricular hypertrophy of the septal wall. Left  ventricular diastolic parameters are consistent with Grade I diastolic  dysfunction (impaired relaxation). Normal left atrial pressure   Neuro/Psych PSYCHIATRIC DISORDERS Depression negative neurological ROS     GI/Hepatic Neg liver ROS, hiatal hernia, GERD  Medicated and Controlled,  Endo/Other  negative endocrine ROS  Renal/GU negative Renal ROS  negative genitourinary   Musculoskeletal  (+) Arthritis , Osteoarthritis,    Abdominal Normal abdominal exam  (+)   Peds  Hematology negative hematology ROS (+) Lab Results      Component                Value               Date                      WBC                      8.5                 03/08/2019                HGB                      16.0                06/24/2020                HCT                      47.0                06/24/2020                MCV                      99.4                03/08/2019                PLT                      217                 03/08/2019              Anesthesia Other Findings   Reproductive/Obstetrics                             Anesthesia Physical  Anesthesia  Plan  ASA: II  Anesthesia Plan: General   Post-op Pain Management:    Induction: Intravenous  PONV Risk Score and Plan: 4 or greater and Treatment may vary due to age or medical condition, Ondansetron, Dexamethasone and Midazolam  Airway Management Planned: LMA  Additional Equipment: None  Intra-op Plan:   Post-operative Plan:   Informed Consent: I have reviewed the patients History and Physical, chart, labs and discussed the procedure including the risks, benefits and alternatives for the proposed anesthesia with the patient or authorized representative who has indicated his/her understanding and acceptance.     Dental advisory given  Plan Discussed with: CRNA  Anesthesia Plan Comments:         Anesthesia Quick Evaluation

## 2020-11-20 NOTE — Discharge Instructions (Signed)
Post Anesthesia Home Care Instructions  Activity: Get plenty of rest for the remainder of the day. A responsible adult should stay with you for 24 hours following the procedure.  For the next 24 hours, DO NOT: -Drive a car -Paediatric nurse -Drink alcoholic beverages -Take any medication unless instructed by your physician -Make any legal decisions or sign important papers.  Meals: Start with liquid foods such as gelatin or soup. Progress to regular foods as tolerated. Avoid greasy, spicy, heavy foods. If nausea and/or vomiting occur, drink only clear liquids until the nausea and/or vomiting subsides. Call your physician if vomiting continues.  Special Instructions/Symptoms: Your throat may feel dry or sore from the anesthesia or the breathing tube placed in your throat during surgery. If this causes discomfort, gargle with warm salt water. The discomfort should disappear within 24 hours.  If you had a scopolamine patch placed behind your ear for the management of post- operative nausea and/or vomiting:  1. The medication in the patch is effective for 72 hours, after which it should be removed.  Wrap patch in a tissue and discard in the trash. Wash hands thoroughly with soap and water. 2. You may remove the patch earlier than 72 hours if you experience unpleasant side effects which may include dry mouth, dizziness or visual disturbances. 3. Avoid touching the patch. Wash your hands with soap and water after contact with the patch.    Transurethral Resection of Bladder Tumor  Transurethral resection of a bladder tumor is the removal (resection) of a cancerous growth (tumor) on the inside wall of the bladder. The bladder is the organ that holds urine. The tumor is removed through the tube that carries urine out of the body (urethra). In a transurethral resection, a thin telescope with a light, a tiny camera, and an electric cutting edge (resectoscope) is passed through the urethra. In  men, the opening of the urethra is at the end of the penis. In women, it is just above the opening of the vagina. Tell a health care provider about:  Any allergies you have.  All medicines you are taking, including vitamins, herbs, eye drops, creams, and over-the-counter medicines.  Any problems you or family members have had with anesthetic medicines.  Any blood disorders you have.  Any surgeries you have had.  Any medical conditions you have.  Any recent urinary tract infections you have had.  Whether you are pregnant or may be pregnant. What are the risks? Generally, this is a safe procedure. However, problems may occur, including:  Infection.  Bleeding.  Allergic reactions to medicines.  Damage to nearby structures or organs, such as: ? The urethra. ? The tubes that drain urine from the kidneys into the bladder (ureters).  Pain and burning during urination.  Difficulty urinating due to partial blockage of the urethra.  Inability to urinate (urinary retention). What happens before the procedure? Staying hydrated Follow instructions from your health care provider about hydration, which may include:  Up to 2 hours before the procedure - you may continue to drink clear liquids, such as water, clear fruit juice, black coffee, and plain tea.   Eating and drinking restrictions Follow instructions from your health care provider about eating and drinking, which may include:  8 hours before the procedure - stop eating heavy meals or foods, such as meat, fried foods, or fatty foods.  6 hours before the procedure - stop eating light meals or foods, such as toast or cereal.  6 hours before  the procedure - stop drinking milk or drinks that contain milk.  2 hours before the procedure - stop drinking clear liquids. Medicines Ask your health care provider about:  Changing or stopping your regular medicines. This is especially important if you are taking diabetes medicines or  blood thinners.  Taking medicines such as aspirin and ibuprofen. These medicines can thin your blood. Do not take these medicines unless your health care provider tells you to take them.  Taking over-the-counter medicines, vitamins, herbs, and supplements. Tests You may have exams or tests, including:  Physical exam.  Blood tests.  Urine tests.  Electrocardiogram (ECG). This test measures the electrical activity of the heart. General instructions  Plan to have someone take you home from the hospital or clinic.  Ask your health care provider how your surgical site will be marked or identified.  Ask your health care provider what steps will be taken to help prevent infection. These may include: ? Washing skin with a germ-killing soap. ? Taking antibiotic medicine. What happens during the procedure?  An IV will be inserted into one of your veins.  You will be given one or more of the following: ? A medicine to help you relax (sedative). ? A medicine to make you fall asleep (general anesthetic). ? A medicine that is injected into your spine to numb the area below and slightly above the injection site (spinal anesthetic).  Your legs will be placed in foot rests (stirrups) so that your legs are apart and your knees are bent.  The resectoscope will be passed through your urethra and into your bladder.  The part of your bladder that is affected by the tumor will be resected using the cutting edge of the resectoscope.  The resectoscope will be removed.  A thin, flexible tube (catheter) will be passed through your urethra and into your bladder. The catheter will drain urine into a bag outside of your body. ? Fluid may be passed through the catheter to keep the catheter open. The procedure may vary among health care providers and hospitals. What happens after the procedure?  Your blood pressure, heart rate, breathing rate, and blood oxygen level will be monitored until you leave  the hospital or clinic.  You may continue to receive fluids and medicines through an IV.  You will have some pain. You will be given pain medicine to relieve pain.  You will have a catheter to drain your urine. ? You will have blood in your urine. Your catheter may be kept in until your urine is clear. ? The amount of urine will be monitored. If necessary, your bladder may be rinsed out (irrigated) by passing fluid through your catheter.  You will be encouraged to walk around as soon as possible.  You may have to wear compression stockings. These stockings help to prevent blood clots and reduce swelling in your legs.  Do not drive for 24 hours if you were given a sedative during your procedure. Summary  Transurethral resection of a bladder tumor is the removal (resection) of a cancerous growth (tumor) on the inside wall of the bladder.  To do this procedure, your health care provider uses a thin telescope with a light, a tiny camera, and an electric cutting edge (resectoscope).  Follow your health care provider's instructions. You may need to stop or change certain medicines, and you may be told to stop eating and drinking several hours before the procedure.  Your blood pressure, heart rate, breathing rate, and  blood oxygen level will be monitored until you leave the hospital or clinic.  You may have to wear compression stockings. These stockings help to prevent blood clots and reduce swelling in your legs. This information is not intended to replace advice given to you by your health care provider. Make sure you discuss any questions you have with your health care provider. Document Revised: 05/25/2018 Document Reviewed: 05/25/2018 Elsevier Patient Education  [place].

## 2020-11-21 LAB — SURGICAL PATHOLOGY

## 2020-11-23 ENCOUNTER — Encounter (HOSPITAL_BASED_OUTPATIENT_CLINIC_OR_DEPARTMENT_OTHER): Payer: Self-pay | Admitting: Urology

## 2020-11-27 DIAGNOSIS — C678 Malignant neoplasm of overlapping sites of bladder: Secondary | ICD-10-CM | POA: Diagnosis not present

## 2020-12-04 DIAGNOSIS — M150 Primary generalized (osteo)arthritis: Secondary | ICD-10-CM | POA: Diagnosis not present

## 2021-01-21 DIAGNOSIS — Z5181 Encounter for therapeutic drug level monitoring: Secondary | ICD-10-CM | POA: Diagnosis not present

## 2021-01-21 DIAGNOSIS — F330 Major depressive disorder, recurrent, mild: Secondary | ICD-10-CM | POA: Diagnosis not present

## 2021-01-21 DIAGNOSIS — H8113 Benign paroxysmal vertigo, bilateral: Secondary | ICD-10-CM | POA: Diagnosis not present

## 2021-01-21 DIAGNOSIS — I712 Thoracic aortic aneurysm, without rupture: Secondary | ICD-10-CM | POA: Diagnosis not present

## 2021-01-21 DIAGNOSIS — R399 Unspecified symptoms and signs involving the genitourinary system: Secondary | ICD-10-CM | POA: Diagnosis not present

## 2021-03-02 DIAGNOSIS — C678 Malignant neoplasm of overlapping sites of bladder: Secondary | ICD-10-CM | POA: Diagnosis not present

## 2021-03-25 DIAGNOSIS — M419 Scoliosis, unspecified: Secondary | ICD-10-CM | POA: Diagnosis not present

## 2021-03-25 DIAGNOSIS — M4316 Spondylolisthesis, lumbar region: Secondary | ICD-10-CM | POA: Diagnosis not present

## 2021-04-07 DIAGNOSIS — M48061 Spinal stenosis, lumbar region without neurogenic claudication: Secondary | ICD-10-CM | POA: Diagnosis not present

## 2021-04-07 DIAGNOSIS — M4316 Spondylolisthesis, lumbar region: Secondary | ICD-10-CM | POA: Diagnosis not present

## 2021-04-07 DIAGNOSIS — M47816 Spondylosis without myelopathy or radiculopathy, lumbar region: Secondary | ICD-10-CM | POA: Diagnosis not present

## 2021-04-07 DIAGNOSIS — M5126 Other intervertebral disc displacement, lumbar region: Secondary | ICD-10-CM | POA: Diagnosis not present

## 2021-06-04 ENCOUNTER — Ambulatory Visit (HOSPITAL_BASED_OUTPATIENT_CLINIC_OR_DEPARTMENT_OTHER): Payer: PPO | Admitting: Family

## 2021-06-04 ENCOUNTER — Other Ambulatory Visit: Payer: Self-pay

## 2021-06-04 ENCOUNTER — Encounter (HOSPITAL_BASED_OUTPATIENT_CLINIC_OR_DEPARTMENT_OTHER): Payer: Self-pay | Admitting: Family

## 2021-06-04 VITALS — BP 104/73 | HR 60 | Ht 72.0 in | Wt 192.6 lb

## 2021-06-04 DIAGNOSIS — I77810 Thoracic aortic ectasia: Secondary | ICD-10-CM | POA: Diagnosis not present

## 2021-06-04 DIAGNOSIS — I493 Ventricular premature depolarization: Secondary | ICD-10-CM

## 2021-06-04 DIAGNOSIS — Z01810 Encounter for preprocedural cardiovascular examination: Secondary | ICD-10-CM

## 2021-06-04 NOTE — Patient Instructions (Addendum)
Medication Instructions:  Continued your current medications.   *If you need a refill on your cardiac medications before your next appointment, please call your pharmacy*  Lab Work: None ordered today.   Testing/Procedures: Your EKG today showed normal sinus rhythm with an occasional early beat called at PVC.  Your physician has requested that you have an echocardiogram in December 2022. Echocardiography is a painless test that uses sound waves to create images of your heart. It provides your doctor with information about the size and shape of your heart and how well your heart's chambers and valves are working. This procedure takes approximately one hour. There are no restrictions for this procedure.    Follow-Up: At [facility], you and your health needs are our priority.  As part of our continuing mission to provide you with exceptional heart care, we have created designated Provider Care Teams.  These Care Teams include your primary Cardiologist (physician) and Advanced Practice Providers (APPs -  Physician Assistants and Nurse Practitioners) who all work together to provide you with the care you need, when you need it.  We recommend signing up for the patient portal called "MyChart".  Sign up information is provided on this After Visit Summary.  MyChart is used to connect with patients for Virtual Visits (Telemedicine).  Patients are able to view lab/test results, encounter notes, upcoming appointments, etc.  Non-urgent messages can be sent to your provider as well.   To learn more about what you can do with MyChart, go to [email or web address].    Your next appointment:   1 year(s)  The format for your next appointment:   In Person  Provider:   You may see Candee Furbish, MD or one of the following Advanced Practice Providers on your designated Care Team:   Cecilie Kicks, NP   Other Instructions

## 2021-06-04 NOTE — Progress Notes (Signed)
Office Visit    Patient Name: Jeremy Day Date of Encounter: 06/04/2021  PCP:  [name], MD   [place]  Cardiologist:  Candee Furbish, MD  Advanced Practice Provider:  No care team member to display Electrophysiologist:  None    Chief Complaint  v  Jeremy Day is a 70 y.o. male with a hx of aortic root dilation, ventricular bigeminy presents today for preoperative cardiovascular clearance   Past Medical History    Past Medical History:  Diagnosis Date   Abnormal tandem walk    using 2 canes due to lower back arthritis   Ascending aorta dilatation ([place])    echo  04-11-2016  16m and dilated aortc root 367m  Bladder tumor    BPH (benign prostatic hyperplasia)    [place]   Depression    Diverticulosis    Full dentures    GERD (gastroesophageal reflux disease)    Hiatal hernia    History of colon polyps    History of staphylococcal infection 2008   right ankle  w/ sepsis   Meningitis spinal age 43 36r 6   OA (osteoarthritis)    lower back, right ankle,, bilateral knees and shouldres   Pneumonia yrs ago   Pre-diabetes    Psoriasis    PVC's (premature ventricular contractions)    2012-- hx bigeminy/ trigeminy with near syncope (cardiology consult note in epic dated 2012)  (last cardiology visit in epic w/ dr skMarlou Porch05-21-2017)   Seasonal allergies    Urgency of urination    Past Surgical History:  Procedure Laterality Date   ANKLE ARTHROSCOPY Right 02-13-2007    dr grBerenice Primas w/ debridement and removal loose body   [place]ONOS[place]  last one summer 2019   [place]   INGUINAL LYMPH NODE BIOPSY  2001  approx.   benign   TRANSURETHRAL RESECTION OF BLADDER TUMOR N/A 02/06/2019   Procedure: TRANSURETHRAL RESECTION OF BLADDER TUMOR (TURBT);  Surgeon: BeLucas MallowMD;  Location: [facility];  Service: Urology;  Laterality: N/A;   TRANSURETHRAL RESECTION OF BLADDER TUMOR N/A 02/20/2019   Procedure:  TRANSURETHRAL RESECTION OF BLADDER TUMOR (TURBT);  Surgeon: BeLucas MallowMD;  Location: [facility];  Service: Urology;  Laterality: N/A;   TRANSURETHRAL RESECTION OF BLADDER TUMOR N/A 03/13/2019   Procedure: TRANSURETHRAL RESECTION OF BLADDER TUMOR (TURBT);  Surgeon: BeLucas MallowMD;  Location: [facility];  Service: Urology;  Laterality: N/A;   TRANSURETHRAL RESECTION OF BLADDER TUMOR N/A 11/20/2020   Procedure: TRANSURETHRAL RESECTION OF BLADDER TUMOR (TURBT) WITH INSTILLATION OF post-op GEMCITABINE;  Surgeon: GaJanith LimaMD;  [facility];  Laterality: N/A;   TRANSURETHRAL RESECTION OF BLADDER TUMOR WITH MITOMYCIN-C N/A 09/23/2019   Procedure: TRANSURETHRAL RESECTION OF BLADDER TUMOR WITH GEMCITABINE INSTILLATION INTO THE BLADDER;  Surgeon: BeLucas MallowMD;  [facility];  Laterality: N/A;   TRANSURETHRAL RESECTION OF BLADDER TUMOR WITH MITOMYCIN-C N/A 06/24/2020   Procedure: TRANSURETHRAL RESECTION OF BLADDER TUMOR WITH POST OPERATIVE GEMCITABINE INSTILLATION;  Surgeon: BeLucas MallowMD;  [facility]: [facility];  Laterality: N/A;    Allergies  No Known Allergies  History of Present Illness    Jeremy Day a [age].o. male with a hx of aortic root dilation, ventricular bigeminy last seen 10/16/19  by Dr. Marlou Porch.  He previously was the [employer] the [employer]. Previously evaluated in the ED a number of years ago with sensation of abdominal fullness and dizziness. He was diagnosed with frequent PVC's in trigeminy, bigeminy pattern. Monitor was placed showing few PVC's which were asymptomatic.   He was last seen 10/16/19 by Dr. Marlou Porch doing well from a cardiac perspective.   He presents today for follow up. He has upcoming back surgery scheduled for 08/09/21. He tells me his back issues have been going on for decades. He has had worsening back pain  for 2 years bu thas been going back and forth with the insurance company. When he is sitting he feels well. He gets pain with ambulation greater than 100 yards but is able to still attain a remarkable exercise capacity despite pain. He walks his two dogs regularly. Reports no shortness of breath nor dyspnea on exertion. Reports no chest pain, pressure, or tightness. No edema, orthopnea, PND. Reports no palpitations.    EKGs/Labs/Other Studies Reviewed:   The following studies were reviewed today:  Echo 10/29/19 1. Left ventricular ejection fraction, by visual estimation, is 60 to  65%. The left ventricle has normal function. There is mildly increased  left ventricular hypertrophy.   2. Left ventricular diastolic parameters are consistent with Grade I  diastolic dysfunction (impaired relaxation).   3. The left ventricle has no regional wall motion abnormalities.   4. Global right ventricle has normal systolic function.The right  ventricular size is mildly enlarged. No increase in right ventricular wall  thickness.   5. Left atrial size was normal.   6. Right atrial size was normal.   7. Mild chordal systolic anterior motion of the mitral valve.   8. The mitral valve is normal in structure. Mild mitral valve  regurgitation. No evidence of mitral stenosis.   9. The tricuspid valve is normal in structure. Tricuspid valve  regurgitation is trivial.  10. The aortic valve is normal in structure. Aortic valve regurgitation is  trivial. No evidence of aortic valve sclerosis or stenosis.  11. The pulmonic valve was normal in structure. Pulmonic valve  regurgitation is trivial.  12. Aneurysm of the ascending aorta, measuring 41 mm.  13. There is moderate dilatation at the level of the sinuses of Valsalva  measuring 44 mm.  14. The inferior vena cava is normal in size with <50% respiratory  variability, suggesting right atrial pressure of 8 mmHg.   EKG:  EKG is  ordered today.  The ekg ordered  today demonstrates NSR 60 bpm with occasional PVC. No acute ST/T wave changes.  Recent Labs: 11/20/2020: BUN 10; Creatinine, Ser 0.80; Hemoglobin 17.0; Potassium 3.9; Sodium 139  Recent Lipid Panel No results found for: CHOL, TRIG, HDL, CHOLHDL, VLDL, LDLCALC, LDLDIRECT Home Medications   Current Meds  Medication Sig   acetaminophen (TYLENOL) 500 MG tablet Take 1,000 mg by mouth every 6 (six) hours as needed (pain.).   calcium carbonate (TUMS - DOSED IN MG ELEMENTAL CALCIUM) 500 MG chewable tablet Chew 1 tablet by mouth daily as needed for indigestion or heartburn.   clobetasol cream (TEMOVATE) [id number] % Apply 1 application topically 2 (two) times daily as needed (psoriasis).    diclofenac (VOLTAREN) 75 MG EC tablet Take 75 mg by mouth daily as needed.   diphenhydrAMINE (BENADRYL) 25 MG tablet Take 25 mg by mouth every 6 (six) hours as needed for allergies.   escitalopram (LEXAPRO) 10 MG tablet Take 10 mg by mouth  daily. Takes 1 10 mg and 1/2 of 10 mg dose is 15 mg   famotidine (PEPCID) 20 MG tablet Take 20 mg by mouth every morning.    mometasone (NASONEX) 50 MCG/ACT nasal spray Place 2 sprays into the nose daily as needed (allergies.).    oxyCODONE-acetaminophen (PERCOCET) 5-325 MG tablet Take 1 tablet by mouth every 4 (four) hours as needed for up to 12 doses for severe pain.   pimecrolimus (ELIDEL) 1 % cream Apply 1 application topically daily as needed (psoriasis).    Polyethyl Glycol-Propyl Glycol (SYSTANE OP) Place 1 drop into both eyes 2 (two) times daily as needed (dry eyes).     Review of Systems      All other systems reviewed and are otherwise negative except as noted above.  Physical Exam    VS:  Ht 6' (1.829 m)   Wt 192 lb 9.6 oz (87.4 kg)   BMI 26.12 kg/m  , BMI Body mass index is 26.12 kg/m.  Wt Readings from Last 3 Encounters:  06/04/21 192 lb 9.6 oz (87.4 kg)  11/20/20 191 lb 9.6 oz (86.9 kg)  06/24/20 186 lb 12.8 oz (84.7 kg)     GEN: Well nourished, well  developed, in no acute distress. HEENT: normal. Neck: Supple, no JVD, carotid bruits, or masses. Cardiac: RRR, no murmurs, rubs, or gallops. No clubbing, cyanosis, edema.  Radials/PT 2+ and equal bilaterally.  Respiratory:  Respirations regular and unlabored, clear to auscultation bilaterally. GI: Soft, nontender, nondistended. MS: No deformity or atrophy. Skin: Warm and dry, no rash. Neuro:  Strength and sensation are intact. Psych: Normal affect.  Assessment & Plan    Preoperative cardiovascular clearance - According to the Revised Cardiac Risk Index (RCRI), his Perioperative Risk of Major Cardiac Event is (%): 0.9. His Functional Capacity in METs is: 6.36 according to the [employer] Activity Status Index (DASI). EKG today shows NSR with occasional PVC and no acute ST/T wave changes. He has no history of coronary artery disease and reports no anginal symptoms. He is deemed acceptable risk for the planned procedure without additional cardiovascular testing. His surgery is not until October and he verbalizes understanding to report new symptoms if they arise prior to surgery. Will route note to his surgical team so they are aware.   Dilated aortic root - Echo 10/2019 with LVEF 60-65%, mild LVH, gr1DD, mild MR, aneurysm of the ascending aorta measuring 77m, moderate dilation at level of sinuses of valsalva measuring 461m Plan to repeat echo 10/2021 for monitoring. Continue optimal blood pressure control, not presently requiring antihypertensive medication.   PVC - Asymptomatic with no palpitations. No indication for AV nodal blocking therapy.   Disposition: Follow up in 1 year(s) with Dr. SkMarlou Porchr APP.  Signed, CaLoel DubonnetNP 06/04/2021, 2:44 PM [place]

## 2021-06-22 DIAGNOSIS — C678 Malignant neoplasm of overlapping sites of bladder: Secondary | ICD-10-CM | POA: Diagnosis not present

## 2021-06-22 DIAGNOSIS — N3289 Other specified disorders of bladder: Secondary | ICD-10-CM | POA: Diagnosis not present

## 2021-06-22 DIAGNOSIS — M549 Dorsalgia, unspecified: Secondary | ICD-10-CM | POA: Diagnosis not present

## 2021-06-22 DIAGNOSIS — C679 Malignant neoplasm of bladder, unspecified: Secondary | ICD-10-CM | POA: Diagnosis not present

## 2021-06-22 DIAGNOSIS — K5730 Diverticulosis of large intestine without perforation or abscess without bleeding: Secondary | ICD-10-CM | POA: Diagnosis not present

## 2021-06-25 DIAGNOSIS — N401 Enlarged prostate with lower urinary tract symptoms: Secondary | ICD-10-CM | POA: Diagnosis not present

## 2021-06-25 DIAGNOSIS — R310 Gross hematuria: Secondary | ICD-10-CM | POA: Diagnosis not present

## 2021-06-25 DIAGNOSIS — R351 Nocturia: Secondary | ICD-10-CM | POA: Diagnosis not present

## 2021-06-25 DIAGNOSIS — C678 Malignant neoplasm of overlapping sites of bladder: Secondary | ICD-10-CM | POA: Diagnosis not present

## 2021-06-25 DIAGNOSIS — R3915 Urgency of urination: Secondary | ICD-10-CM | POA: Diagnosis not present

## 2021-06-28 DIAGNOSIS — C678 Malignant neoplasm of overlapping sites of bladder: Secondary | ICD-10-CM | POA: Diagnosis not present

## 2021-07-13 DIAGNOSIS — Z0189 Encounter for other specified special examinations: Secondary | ICD-10-CM | POA: Diagnosis not present

## 2021-07-13 DIAGNOSIS — I77810 Thoracic aortic ectasia: Secondary | ICD-10-CM | POA: Diagnosis not present

## 2021-07-13 DIAGNOSIS — M4807 Spinal stenosis, lumbosacral region: Secondary | ICD-10-CM | POA: Diagnosis not present

## 2021-07-13 DIAGNOSIS — Z7189 Other specified counseling: Secondary | ICD-10-CM | POA: Diagnosis not present

## 2021-07-13 DIAGNOSIS — F339 Major depressive disorder, recurrent, unspecified: Secondary | ICD-10-CM | POA: Diagnosis not present

## 2021-07-13 DIAGNOSIS — G9519 Other vascular myelopathies: Secondary | ICD-10-CM | POA: Diagnosis not present

## 2021-07-20 DIAGNOSIS — F330 Major depressive disorder, recurrent, mild: Secondary | ICD-10-CM | POA: Diagnosis not present

## 2021-07-20 DIAGNOSIS — Z23 Encounter for immunization: Secondary | ICD-10-CM | POA: Diagnosis not present

## 2021-07-20 DIAGNOSIS — M150 Primary generalized (osteo)arthritis: Secondary | ICD-10-CM | POA: Diagnosis not present

## 2021-08-07 HISTORY — PX: SPINAL FUSION: SHX223

## 2021-08-09 DIAGNOSIS — I749 Embolism and thrombosis of unspecified artery: Secondary | ICD-10-CM | POA: Diagnosis not present

## 2021-08-09 DIAGNOSIS — K219 Gastro-esophageal reflux disease without esophagitis: Secondary | ICD-10-CM | POA: Diagnosis not present

## 2021-08-09 DIAGNOSIS — G8929 Other chronic pain: Secondary | ICD-10-CM | POA: Diagnosis not present

## 2021-08-09 DIAGNOSIS — Z79899 Other long term (current) drug therapy: Secondary | ICD-10-CM | POA: Diagnosis not present

## 2021-08-09 DIAGNOSIS — I498 Other specified cardiac arrhythmias: Secondary | ICD-10-CM | POA: Diagnosis not present

## 2021-08-09 DIAGNOSIS — R778 Other specified abnormalities of plasma proteins: Secondary | ICD-10-CM | POA: Diagnosis not present

## 2021-08-09 DIAGNOSIS — R578 Other shock: Secondary | ICD-10-CM | POA: Diagnosis not present

## 2021-08-09 DIAGNOSIS — I499 Cardiac arrhythmia, unspecified: Secondary | ICD-10-CM | POA: Diagnosis not present

## 2021-08-09 DIAGNOSIS — N400 Enlarged prostate without lower urinary tract symptoms: Secondary | ICD-10-CM | POA: Diagnosis not present

## 2021-08-09 DIAGNOSIS — M4316 Spondylolisthesis, lumbar region: Secondary | ICD-10-CM | POA: Diagnosis not present

## 2021-08-09 DIAGNOSIS — F4542 Pain disorder with related psychological factors: Secondary | ICD-10-CM | POA: Diagnosis not present

## 2021-08-09 DIAGNOSIS — I959 Hypotension, unspecified: Secondary | ICD-10-CM | POA: Diagnosis not present

## 2021-08-09 DIAGNOSIS — M48061 Spinal stenosis, lumbar region without neurogenic claudication: Secondary | ICD-10-CM | POA: Diagnosis not present

## 2021-08-09 DIAGNOSIS — M438X6 Other specified deforming dorsopathies, lumbar region: Secondary | ICD-10-CM | POA: Diagnosis not present

## 2021-08-09 DIAGNOSIS — D494 Neoplasm of unspecified behavior of bladder: Secondary | ICD-10-CM | POA: Diagnosis not present

## 2021-08-09 DIAGNOSIS — I495 Sick sinus syndrome: Secondary | ICD-10-CM | POA: Diagnosis not present

## 2021-08-09 DIAGNOSIS — Z20822 Contact with and (suspected) exposure to covid-19: Secondary | ICD-10-CM | POA: Diagnosis not present

## 2021-08-09 DIAGNOSIS — I77810 Thoracic aortic ectasia: Secondary | ICD-10-CM | POA: Diagnosis not present

## 2021-08-09 DIAGNOSIS — R7303 Prediabetes: Secondary | ICD-10-CM | POA: Diagnosis not present

## 2021-08-09 DIAGNOSIS — G8918 Other acute postprocedural pain: Secondary | ICD-10-CM | POA: Diagnosis not present

## 2021-08-09 DIAGNOSIS — Z9221 Personal history of antineoplastic chemotherapy: Secondary | ICD-10-CM | POA: Diagnosis not present

## 2021-08-09 DIAGNOSIS — F32A Depression, unspecified: Secondary | ICD-10-CM | POA: Diagnosis not present

## 2021-08-09 DIAGNOSIS — G9741 Accidental puncture or laceration of dura during a procedure: Secondary | ICD-10-CM | POA: Diagnosis not present

## 2021-08-09 DIAGNOSIS — M4185 Other forms of scoliosis, thoracolumbar region: Secondary | ICD-10-CM | POA: Diagnosis not present

## 2021-08-09 DIAGNOSIS — D62 Acute posthemorrhagic anemia: Secondary | ICD-10-CM | POA: Diagnosis not present

## 2021-08-09 DIAGNOSIS — I361 Nonrheumatic tricuspid (valve) insufficiency: Secondary | ICD-10-CM | POA: Diagnosis not present

## 2021-08-09 DIAGNOSIS — R579 Shock, unspecified: Secondary | ICD-10-CM | POA: Diagnosis not present

## 2021-08-10 DIAGNOSIS — G8918 Other acute postprocedural pain: Secondary | ICD-10-CM | POA: Diagnosis not present

## 2021-08-10 DIAGNOSIS — M48061 Spinal stenosis, lumbar region without neurogenic claudication: Secondary | ICD-10-CM | POA: Diagnosis not present

## 2021-08-10 DIAGNOSIS — I499 Cardiac arrhythmia, unspecified: Secondary | ICD-10-CM | POA: Diagnosis not present

## 2021-08-10 DIAGNOSIS — R579 Shock, unspecified: Secondary | ICD-10-CM | POA: Diagnosis not present

## 2021-08-10 DIAGNOSIS — R778 Other specified abnormalities of plasma proteins: Secondary | ICD-10-CM | POA: Diagnosis not present

## 2021-08-10 DIAGNOSIS — I77810 Thoracic aortic ectasia: Secondary | ICD-10-CM | POA: Diagnosis not present

## 2021-08-10 DIAGNOSIS — I361 Nonrheumatic tricuspid (valve) insufficiency: Secondary | ICD-10-CM | POA: Diagnosis not present

## 2021-08-10 DIAGNOSIS — I749 Embolism and thrombosis of unspecified artery: Secondary | ICD-10-CM | POA: Diagnosis not present

## 2021-08-11 DIAGNOSIS — M48061 Spinal stenosis, lumbar region without neurogenic claudication: Secondary | ICD-10-CM | POA: Diagnosis not present

## 2021-08-13 DIAGNOSIS — M48061 Spinal stenosis, lumbar region without neurogenic claudication: Secondary | ICD-10-CM | POA: Diagnosis not present

## 2021-08-13 DIAGNOSIS — D494 Neoplasm of unspecified behavior of bladder: Secondary | ICD-10-CM | POA: Diagnosis not present

## 2021-08-13 DIAGNOSIS — I498 Other specified cardiac arrhythmias: Secondary | ICD-10-CM | POA: Diagnosis not present

## 2021-08-13 DIAGNOSIS — I77810 Thoracic aortic ectasia: Secondary | ICD-10-CM | POA: Diagnosis not present

## 2021-08-19 DIAGNOSIS — Z8551 Personal history of malignant neoplasm of bladder: Secondary | ICD-10-CM | POA: Diagnosis not present

## 2021-08-19 DIAGNOSIS — K219 Gastro-esophageal reflux disease without esophagitis: Secondary | ICD-10-CM | POA: Diagnosis not present

## 2021-08-19 DIAGNOSIS — M48061 Spinal stenosis, lumbar region without neurogenic claudication: Secondary | ICD-10-CM | POA: Diagnosis not present

## 2021-08-19 DIAGNOSIS — M4156 Other secondary scoliosis, lumbar region: Secondary | ICD-10-CM | POA: Diagnosis not present

## 2021-08-19 DIAGNOSIS — R279 Unspecified lack of coordination: Secondary | ICD-10-CM | POA: Diagnosis not present

## 2021-08-19 DIAGNOSIS — N400 Enlarged prostate without lower urinary tract symptoms: Secondary | ICD-10-CM | POA: Diagnosis not present

## 2021-08-19 DIAGNOSIS — R6889 Other general symptoms and signs: Secondary | ICD-10-CM | POA: Diagnosis not present

## 2021-08-19 DIAGNOSIS — D62 Acute posthemorrhagic anemia: Secondary | ICD-10-CM | POA: Diagnosis not present

## 2021-08-19 DIAGNOSIS — Z79899 Other long term (current) drug therapy: Secondary | ICD-10-CM | POA: Diagnosis not present

## 2021-08-19 DIAGNOSIS — M48062 Spinal stenosis, lumbar region with neurogenic claudication: Secondary | ICD-10-CM | POA: Diagnosis not present

## 2021-08-19 DIAGNOSIS — F32A Depression, unspecified: Secondary | ICD-10-CM | POA: Diagnosis not present

## 2021-08-19 DIAGNOSIS — F419 Anxiety disorder, unspecified: Secondary | ICD-10-CM | POA: Diagnosis not present

## 2021-08-19 DIAGNOSIS — Z4789 Encounter for other orthopedic aftercare: Secondary | ICD-10-CM | POA: Diagnosis not present

## 2021-08-19 DIAGNOSIS — Z7409 Other reduced mobility: Secondary | ICD-10-CM | POA: Diagnosis not present

## 2021-08-19 DIAGNOSIS — M792 Neuralgia and neuritis, unspecified: Secondary | ICD-10-CM | POA: Diagnosis not present

## 2021-08-19 DIAGNOSIS — F418 Other specified anxiety disorders: Secondary | ICD-10-CM | POA: Diagnosis not present

## 2021-08-19 DIAGNOSIS — D696 Thrombocytopenia, unspecified: Secondary | ICD-10-CM | POA: Diagnosis not present

## 2021-08-19 DIAGNOSIS — R778 Other specified abnormalities of plasma proteins: Secondary | ICD-10-CM | POA: Diagnosis not present

## 2021-08-19 DIAGNOSIS — Z981 Arthrodesis status: Secondary | ICD-10-CM | POA: Diagnosis not present

## 2021-08-19 DIAGNOSIS — D72829 Elevated white blood cell count, unspecified: Secondary | ICD-10-CM | POA: Diagnosis not present

## 2021-09-08 ENCOUNTER — Ambulatory Visit: Payer: PPO | Attending: Physical Medicine and Rehabilitation | Admitting: Physical Therapy

## 2021-09-08 ENCOUNTER — Other Ambulatory Visit: Payer: Self-pay

## 2021-09-08 DIAGNOSIS — M79604 Pain in right leg: Secondary | ICD-10-CM | POA: Insufficient documentation

## 2021-09-08 DIAGNOSIS — R293 Abnormal posture: Secondary | ICD-10-CM | POA: Diagnosis not present

## 2021-09-08 DIAGNOSIS — M79605 Pain in left leg: Secondary | ICD-10-CM | POA: Diagnosis not present

## 2021-09-08 NOTE — Therapy (Signed)
[facility] [place], [place], [address] Phone: [phone]   Fax:  [phone]  Physical Therapy Evaluation  Patient Details  Name: Jeremy Day MRN: [id number] Date of Birth: [birth year] Referring Provider (PT): Ina Homes MD   Encounter Date: 09/08/2021   PT End of Session - 09/08/21 1200     Visit Number 1    Number of Visits 16    Date for PT Re-Evaluation 12/07/21    Authorization Type FOTO AT LEAST EVERY 5TH VISIT.  PROGRESS NOTE AT 10TH VISIT.  KX MODIFIER AFTER 15 VISITS.    PT Start Time 1115    PT Stop Time 1141    PT Time Calculation (min) 26 min    Activity Tolerance Patient tolerated treatment well    Behavior During Therapy WFL for tasks assessed/performed             Past Medical History:  Diagnosis Date   Abnormal tandem walk    using 2 canes due to lower back arthritis   Ascending aorta dilatation (HCC)    echo  04-11-2016  17mm and dilated aortc root 32mm   Bladder tumor    BPH (benign prostatic hyperplasia)    Cancer ([place]) 2020   bladder   Depression    Diverticulosis    Full dentures    GERD (gastroesophageal reflux disease)    Hiatal hernia    History of colon polyps    History of staphylococcal infection 2008   right ankle  w/ sepsis   Meningitis spinal age 55 or 4   OA (osteoarthritis)    lower back, right ankle,, bilateral knees and shouldres   Pneumonia yrs ago   Pre-diabetes    Psoriasis    PVC's (premature ventricular contractions)    2012-- hx bigeminy/ trigeminy with near syncope (cardiology consult note in epic dated 2012)  (last cardiology visit in epic w/ dr Marlou Porch, 03-27-2016)   Seasonal allergies    Urgency of urination     Past Surgical History:  Procedure Laterality Date   ANKLE ARTHROSCOPY Right 02-13-2007    dr [name]   w/ debridement and removal loose body   COLONOSCOPY  last one summer 2019   [place] Right 1980s   INGUINAL LYMPH  NODE BIOPSY  2001  approx.   benign   TRANSURETHRAL RESECTION OF BLADDER TUMOR N/A 02/06/2019   Procedure: TRANSURETHRAL RESECTION OF BLADDER TUMOR (TURBT);  Surgeon: Lucas Mallow, MD;  Location: [facility];  Service: Urology;  Laterality: N/A;   TRANSURETHRAL RESECTION OF BLADDER TUMOR N/A 02/20/2019   Procedure: TRANSURETHRAL RESECTION OF BLADDER TUMOR (TURBT);  Surgeon: Lucas Mallow, MD;  Location: [facility];  Service: Urology;  Laterality: N/A;   TRANSURETHRAL RESECTION OF BLADDER TUMOR N/A 03/13/2019   Procedure: TRANSURETHRAL RESECTION OF BLADDER TUMOR (TURBT);  Surgeon: Lucas Mallow, MD;  Location: [facility];  Service: Urology;  Laterality: N/A;   TRANSURETHRAL RESECTION OF BLADDER TUMOR N/A 11/20/2020   Procedure: TRANSURETHRAL RESECTION OF BLADDER TUMOR (TURBT) WITH INSTILLATION OF post-op GEMCITABINE;  Surgeon: Janith Lima, MD;  Location: [facility];  Service: Urology;  Laterality: N/A;   TRANSURETHRAL RESECTION OF BLADDER TUMOR WITH MITOMYCIN-C N/A 09/23/2019   Procedure: TRANSURETHRAL RESECTION OF BLADDER TUMOR WITH GEMCITABINE INSTILLATION INTO THE BLADDER;  Surgeon: Lucas Mallow, MD;  Location: [facility];  Service: Urology;  Laterality: N/A;   TRANSURETHRAL RESECTION OF BLADDER TUMOR WITH  MITOMYCIN-C N/A 06/24/2020   Procedure: TRANSURETHRAL RESECTION OF BLADDER TUMOR WITH POST OPERATIVE GEMCITABINE INSTILLATION;  Surgeon: Lucas Mallow, MD;  Location: [facility];  Service: Urology;  Laterality: N/A;    There were no vitals filed for this visit.    Subjective Assessment - 09/08/21 1202     Subjective COVID-19 screen performed prior to patient entering clinic.  The patient presents to the clinic today s/p T8 to ilium fusion performed on 08/19/21.  Following surgery the patient states he received inpatient rehab and is pleased with his progress thus far.  At this time he is reporting pain into his buttocks and LE's.  At  rest, while sitting in the exam room his pain is about a 2-3/10 today and a 5-6/10 today with movment.  Mutiple pain descriptors listed (ache, sore, throbbing, numb shooting).  He is using a FWW for safe ambulation.  He would like to walk independtly again without an assisitve device.  Lying down and medication decreases his pain.  Patient is well educated in his contraindications and states "BLT" (no bending, lifting or twisting).    Pertinent History Bladder cancer, right ankle surgery, OA, hernia repair (right).    Limitations Standing    How long can you sit comfortably? Up to an hour.    How long can you stand comfortably? Currently around 2 minutes per patient report.    How long can you walk comfortably? Been walking around home with FWW.    Patient Stated Goals Become as independent as possible and walk without assisitve device.    Currently in Pain? --   Please see above.               [facility] PT Assessment - 09/08/21 0001       Assessment   Medical Diagnosis T8 to ilium fusion    Referring Provider (PT) Ina Homes MD    Onset Date/Surgical Date --   08/19/21 (surgery date).     Precautions   Precaution Comments Follow fusion protocol.  Patient well educated in "BLT" (no bending, arching or twisting).      Restrictions   Weight Bearing Restrictions No      Balance Screen   Has the patient fallen in the past 6 months No    Has the patient had a decrease in activity level because of a fear of falling?  No    Is the patient reluctant to leave their home because of a fear of falling?  No      Home Environment   Living Environment Private residence    Living Arrangements Spouse/significant other      Prior Function   Level of [place] with household mobility with device   Prior to surgery patient use to ambulate with canes bilaterally.     Observation/Other Assessments   Observations Patient's spinal incision looks to be healing very well.       Functional Tests   Functional tests --   Patient performed Nustep a few minutes to assess sustained U/LE movement.  His did so at level 3 with no complaints with a SPM > 80 with a post HR of 70 BPM and 02 sat at 98%.     Posture/Postural Control   Posture/Postural Control Postural limitations    Postural Limitations Rounded Shoulders;Forward head    Posture Comments Patient able to stand upright.  He exhibits right tibial ER.      Deep Tendon Reflexes  DTR Assessment Site Patella;Achilles    Patella DTR 0    Achilles DTR 0      ROM / Strength   AROM / PROM / Strength AROM      AROM   Overall AROM Comments Right ankle limited to neutral due to injury many years ago (subsquent surgery).  Bilateral hip flexion passively to 105 degrees.  WFL for bilateral knees.      Strength   Overall Strength Comments Deferred hip flexor testing but bilateral hip abduction is 4+/5, bilateral knee extension and flexion as well as bilateral dorsiflexion is normal.      Palpation   Palpation comment Patient reporting no palpable tenderness on either side of his spinal incision.      Special Tests   Other special tests Negative Romberg test.      Transfers   Comments The patient is easily able to transition from sit to stand with armrest.      Ambulation/Gait   Gait Comments The patient is walking safely with a FWW with a upright posture.  His gait is currently remarkable for a widened base of support and a decrease in step and stride length.  His right LE is remarkable for tibial ER and decreased dorsiflexion due to an injury many years ago.                        Objective measurements completed on examination: See above findings.                  PT Short Term Goals - 09/08/21 1235       PT SHORT TERM GOAL #1   Title Independent with an initial HEP.    Time 2    Period Weeks    Status New               PT Long Term Goals - 09/08/21 1236       PT  LONG TERM GOAL #1   Title Independent with an advanced HEP.    Time 8    Period Weeks    Status New      PT LONG TERM GOAL #2   Title Patient able to stand 30 minutes.    Time 8    Period Weeks    Status New      PT LONG TERM GOAL #3   Title Perform ADL's with pain not > 3/10.    Time 8    Period Weeks    Status New      PT LONG TERM GOAL #4   Title Walk 500 feet with a straight cane include over uneven terrain (ie: lawn) and up/dowm 4 steps in a reciprocating fashion with railing.    Time 8    Period Weeks    Status New                    Plan - 09/08/21 1227     Clinical Impression Statement The patient presents to OPPT s/p T8 to ilium fusion performed on 08/19/21.  Today, he presented to the clinic walking safely and upright with a FWW.  His gait is remarkable for a wide base of support and decreased step and stride length and right tibial ER and decreased right ankle dorsiflexion due to a injury many yeras ago.  He is easily able to transfer from sit to stand with armrest.  His incision appears to be healing very well and  he has no palpable pain on either sided of the incision.  His CC is that of pain into his buttocks and LE's.  His LE strength at knees and ankle is essentially normal.  He demonstrates a negative Romberg test.  He is very motivated and hopes to gain as much independence as possible that would include walking without an assisitve device.  Patient will benefit from skilled physical therapy intervention to address pain and deficits.    Personal Factors and Comorbidities Comorbidity 1    Comorbidities Bladder cancer, right ankle surgery, OA, hernia repair (right).    Examination-Activity Limitations Other;Locomotion Level;Stand    Examination-Participation Restrictions Other    Stability/Clinical Decision Making Stable/Uncomplicated    Clinical Decision Making Low    Rehab Potential Excellent    PT Frequency 2x / week    PT Duration 8 weeks    PT  Treatment/Interventions ADLs/Self Care Home Management;Cryotherapy;Moist Heat;Therapeutic activities;Therapeutic exercise;Manual techniques;Patient/family education;Passive range of motion;Gait training;Stair training;Functional mobility training;Neuromuscular re-education    PT Next Visit Plan FOTO.....Marland Kitchenprogress per fusion protocol.  Nustep, progressive core exercises, Gait and neuro re-education activities.    Consulted and Agree with Plan of Care Patient             Patient will benefit from skilled therapeutic intervention in order to improve the following deficits and impairments:  Abnormal gait, Decreased activity tolerance, Postural dysfunction, Pain  Visit Diagnosis: Abnormal posture - Plan: PT plan of care cert/re-cert  Pain in left leg - Plan: PT plan of care cert/re-cert  Pain in right leg - Plan: PT plan of care cert/re-cert     Problem List Patient Active Problem List   Diagnosis Date Noted   Bladder tumor 02/06/2019   Arthritis of right ankle 07/15/2015   Septic arthritis of right ankle ([place]) 07/15/2015   MSSA (methicillin susceptible Staphylococcus aureus) infection 07/15/2015   Ankle fracture, right 07/15/2015   Traumatic osteoarthritis of right ankle 07/15/2015   Dysphagia, unspecified(787.20) 04/30/2014   Heartburn 04/30/2014   Shoulder impingement syndrome    GERD (gastroesophageal reflux disease)     Mason Dibiasio, [place], PT 09/08/2021, 12:45 PM  [facility] [address] [place], [place], [address] Phone: [phone]   Fax:  [phone]  Name: Jeremy Day MRN: [id number] Date of Birth: 05-06-1951

## 2021-09-09 DIAGNOSIS — Z09 Encounter for follow-up examination after completed treatment for conditions other than malignant neoplasm: Secondary | ICD-10-CM | POA: Diagnosis not present

## 2021-09-09 DIAGNOSIS — M48062 Spinal stenosis, lumbar region with neurogenic claudication: Secondary | ICD-10-CM | POA: Diagnosis not present

## 2021-09-09 DIAGNOSIS — K4090 Unilateral inguinal hernia, without obstruction or gangrene, not specified as recurrent: Secondary | ICD-10-CM | POA: Diagnosis not present

## 2021-09-13 ENCOUNTER — Ambulatory Visit: Payer: PPO

## 2021-09-13 ENCOUNTER — Other Ambulatory Visit: Payer: Self-pay

## 2021-09-13 DIAGNOSIS — R293 Abnormal posture: Secondary | ICD-10-CM | POA: Diagnosis not present

## 2021-09-13 DIAGNOSIS — M79604 Pain in right leg: Secondary | ICD-10-CM

## 2021-09-13 DIAGNOSIS — M79605 Pain in left leg: Secondary | ICD-10-CM

## 2021-09-13 NOTE — Therapy (Signed)
[facility] [place], [place], [address] Phone: [phone]   Fax:  [phone]  Physical Therapy Treatment  Patient Details  Name: MARCELIS WISSNER MRN: [id number] Date of Birth: 16-Oct-1951 Referring Provider (PT): Ina Homes MD   Encounter Date: 09/13/2021   PT End of Session - 09/13/21 0951     Visit Number 2    Number of Visits 16    Date for PT Re-Evaluation 12/07/21    Authorization Type FOTO AT LEAST EVERY 5TH VISIT.  PROGRESS NOTE AT 10TH VISIT.  KX MODIFIER AFTER 15 VISITS.    PT Start Time 0945    PT Stop Time 1030    PT Time Calculation (min) 45 min    Activity Tolerance Patient tolerated treatment well    Behavior During Therapy WFL for tasks assessed/performed             Past Medical History:  Diagnosis Date   Abnormal tandem walk    using 2 canes due to lower back arthritis   Ascending aorta dilatation (HCC)    echo  04-11-2016  84mm and dilated aortc root 25mm   Bladder tumor    BPH (benign prostatic hyperplasia)    Cancer ([place]) 2020   bladder   Depression    Diverticulosis    Full dentures    GERD (gastroesophageal reflux disease)    Hiatal hernia    History of colon polyps    History of staphylococcal infection 2008   right ankle  w/ sepsis   Meningitis spinal age 1 or 4   OA (osteoarthritis)    lower back, right ankle,, bilateral knees and shouldres   Pneumonia yrs ago   Pre-diabetes    Psoriasis    PVC's (premature ventricular contractions)    2012-- hx bigeminy/ trigeminy with near syncope (cardiology consult note in epic dated 2012)  (last cardiology visit in epic w/ dr Marlou Porch, 03-27-2016)   Seasonal allergies    Urgency of urination     Past Surgical History:  Procedure Laterality Date   ANKLE ARTHROSCOPY Right 02-13-2007    dr [name]   w/ debridement and removal loose body   COLONOSCOPY  last one summer 2019   [place] Right 1980s   INGUINAL LYMPH  NODE BIOPSY  2001  approx.   benign   TRANSURETHRAL RESECTION OF BLADDER TUMOR N/A 02/06/2019   Procedure: TRANSURETHRAL RESECTION OF BLADDER TUMOR (TURBT);  Surgeon: Lucas Mallow, MD;  Location: [facility];  Service: Urology;  Laterality: N/A;   TRANSURETHRAL RESECTION OF BLADDER TUMOR N/A 02/20/2019   Procedure: TRANSURETHRAL RESECTION OF BLADDER TUMOR (TURBT);  Surgeon: Lucas Mallow, MD;  Location: [facility];  Service: Urology;  Laterality: N/A;   TRANSURETHRAL RESECTION OF BLADDER TUMOR N/A 03/13/2019   Procedure: TRANSURETHRAL RESECTION OF BLADDER TUMOR (TURBT);  Surgeon: Lucas Mallow, MD;  Location: [facility];  Service: Urology;  Laterality: N/A;   TRANSURETHRAL RESECTION OF BLADDER TUMOR N/A 11/20/2020   Procedure: TRANSURETHRAL RESECTION OF BLADDER TUMOR (TURBT) WITH INSTILLATION OF post-op GEMCITABINE;  Surgeon: Janith Lima, MD;  Location: [facility];  Service: Urology;  Laterality: N/A;   TRANSURETHRAL RESECTION OF BLADDER TUMOR WITH MITOMYCIN-C N/A 09/23/2019   Procedure: TRANSURETHRAL RESECTION OF BLADDER TUMOR WITH GEMCITABINE INSTILLATION INTO THE BLADDER;  Surgeon: Lucas Mallow, MD;  Location: [facility];  Service: Urology;  Laterality: N/A;   TRANSURETHRAL RESECTION OF BLADDER TUMOR WITH  MITOMYCIN-C N/A 06/24/2020   Procedure: TRANSURETHRAL RESECTION OF BLADDER TUMOR WITH POST OPERATIVE GEMCITABINE INSTILLATION;  Surgeon: Lucas Mallow, MD;  Location: [facility];  Service: Urology;  Laterality: N/A;    There were no vitals filed for this visit.   Subjective Assessment - 09/13/21 0949     Subjective COVID-19 screen performed prior to patient entering clinic.  Pt arrives to today's treamtent session reporting 1/10 B hips, knees, and thighs.  Pt does endorse already "being tired" today."    Pertinent History Bladder cancer, right ankle surgery, OA, hernia repair (right).    Limitations Standing    How long can you sit  comfortably? Up to an hour.    How long can you stand comfortably? Currently around 2 minutes per patient report.    How long can you walk comfortably? Been walking around home with FWW.    Patient Stated Goals Become as independent as possible and walk without assisitve device.    Pain Score 1     Pain Location Knee    Pain Orientation Right;Left                [facility] PT Assessment - 09/13/21 0001       Observation/Other Assessments   Focus on Therapeutic Outcomes (FOTO)  Complete Intake 11/7   70                          [facility] Adult PT Treatment/Exercise - 09/13/21 0001       Exercises   Exercises Lumbar;Knee/Hip      Lumbar Exercises: Aerobic   Nustep Lvl 3 x 15 mins      Lumbar Exercises: Standing   Heel Raises 20 reps   BUE support, cues for posture and pacing   Heel Raises Limitations Toe Raises x 20 cues to tuck buttocks      Knee/Hip Exercises: Standing   Hip Abduction Stengthening;Both;20 reps;Knee straight   Cues for posture   Hip Extension Stengthening;Both;20 reps;Knee straight   Cues to avoid lean   Forward Step Up 20 reps;Hand Hold: 2;Step Height: 4"   Cues for pacing     Knee/Hip Exercises: Seated   Long Arc Quad Strengthening;Both;20 reps    Long Arc Quad Weight 2 lbs.    Ball Squeeze 20 reps with 3 sec hold    Clamshell with TheraBand Red   20 reps with 3 sec hold   Marching Strengthening;AROM   2 mins, cues for pacing   Sit to Sand 2 sets;10 reps;with UE support                       PT Short Term Goals - 09/08/21 1235       PT SHORT TERM GOAL #1   Title Independent with an initial HEP.    Time 2    Period Weeks    Status New               PT Long Term Goals - 09/08/21 1236       PT LONG TERM GOAL #1   Title Independent with an advanced HEP.    Time 8    Period Weeks    Status New      PT LONG TERM GOAL #2   Title Patient able to stand 30 minutes.    Time 8    Period Weeks    Status New  PT LONG TERM GOAL #3   Title Perform ADL's with pain not > 3/10.    Time 8    Period Weeks    Status New      PT LONG TERM GOAL #4   Title Walk 500 feet with a straight cane include over uneven terrain (ie: lawn) and up/dowm 4 steps in a reciprocating fashion with railing.    Time 8    Period Weeks    Status New                   Plan - 09/13/21 0951     Clinical Impression Statement Pt arrives for today's treatment stating he feels 4/10 exhausted, but only reports 1/10 pain in B knees, hips, and thighs.  Pt ambulates into the facility with B canes, but ambulates throughout the gym without an AD.  Pt tolerated all exercises well today without report of pain.  Pt does require two seated rest breaks due to fatigue.  Pt requiring min cues for posture and pacing with various standing exercises.  Pt discouraged that he has to use BUE for all sit to stand reps during today's session. Pt denies any pain at completion of today's treatment session.    Personal Factors and Comorbidities Comorbidity 1    Comorbidities Bladder cancer, right ankle surgery, OA, hernia repair (right).    Examination-Activity Limitations Other;Locomotion Level;Stand    Examination-Participation Restrictions Other    Stability/Clinical Decision Making Stable/Uncomplicated    Rehab Potential Excellent    PT Frequency 2x / week    PT Duration 8 weeks    PT Treatment/Interventions ADLs/Self Care Home Management;Cryotherapy;Moist Heat;Therapeutic activities;Therapeutic exercise;Manual techniques;Patient/family education;Passive range of motion;Gait training;Stair training;Functional mobility training;Neuromuscular re-education    PT Next Visit Plan FOTO.....Marland Kitchenprogress per fusion protocol.  Nustep, progressive core exercises, Gait and neuro re-education activities.    Consulted and Agree with Plan of Care Patient             Patient will benefit from skilled therapeutic intervention in order to improve the  following deficits and impairments:  Abnormal gait, Decreased activity tolerance, Postural dysfunction, Pain  Visit Diagnosis: Abnormal posture  Pain in left leg  Pain in right leg     Problem List Patient Active Problem List   Diagnosis Date Noted   Bladder tumor 02/06/2019   Arthritis of right ankle 07/15/2015   Septic arthritis of right ankle (HCC) 07/15/2015   MSSA (methicillin susceptible Staphylococcus aureus) infection 07/15/2015   Ankle fracture, right 07/15/2015   Traumatic osteoarthritis of right ankle 07/15/2015   Dysphagia, unspecified(787.20) 04/30/2014   Heartburn 04/30/2014   Shoulder impingement syndrome    GERD (gastroesophageal reflux disease)     Kathrynn Ducking, PTA 09/13/2021, 10:35 AM  [facility] [address] [place], [place], [address] Phone: [phone]   Fax:  [phone]  Name: HODGE STACHNIK MRN: [id number] Date of Birth: August 26, 1951

## 2021-09-16 ENCOUNTER — Ambulatory Visit: Payer: PPO | Admitting: Physical Therapy

## 2021-09-16 ENCOUNTER — Other Ambulatory Visit: Payer: Self-pay

## 2021-09-16 ENCOUNTER — Encounter: Payer: Self-pay | Admitting: Physical Therapy

## 2021-09-16 DIAGNOSIS — R293 Abnormal posture: Secondary | ICD-10-CM

## 2021-09-16 DIAGNOSIS — M79604 Pain in right leg: Secondary | ICD-10-CM

## 2021-09-16 DIAGNOSIS — M79605 Pain in left leg: Secondary | ICD-10-CM

## 2021-09-16 NOTE — Therapy (Signed)
[facility] [place], [place], [address] Phone: [phone]   Fax:  [phone]  Physical Therapy Treatment  Patient Details  Name: Jeremy Day MRN: [id number] Date of Birth: 04-Feb-1951 Referring Provider (PT): Ina Homes MD   Encounter Date: 09/16/2021   PT End of Session - 09/16/21 0950     Visit Number 3    Number of Visits 16    Date for PT Re-Evaluation 12/07/21    Authorization Type FOTO AT LEAST EVERY 5TH VISIT.  PROGRESS NOTE AT 10TH VISIT.  KX MODIFIER AFTER 15 VISITS.    PT Start Time [phone]    PT Stop Time 1027    PT Time Calculation (min) 39 min    Equipment Utilized During Treatment Other (comment)   B SPCs but not used in clinic gym   Activity Tolerance Patient tolerated treatment well    Behavior During Therapy [facility] for tasks assessed/performed             Past Medical History:  Diagnosis Date   Abnormal tandem walk    using 2 canes due to lower back arthritis   Ascending aorta dilatation (HCC)    echo  04-11-2016  51mm and dilated aortc root 71mm   Bladder tumor    BPH (benign prostatic hyperplasia)    Cancer ([place]) 2020   bladder   Depression    Diverticulosis    Full dentures    GERD (gastroesophageal reflux disease)    Hiatal hernia    History of colon polyps    History of staphylococcal infection 2008   right ankle  w/ sepsis   Meningitis spinal age 81 or 4   OA (osteoarthritis)    lower back, right ankle,, bilateral knees and shouldres   Pneumonia yrs ago   Pre-diabetes    Psoriasis    PVC's (premature ventricular contractions)    2012-- hx bigeminy/ trigeminy with near syncope (cardiology consult note in epic dated 2012)  (last cardiology visit in epic w/ dr Marlou Porch, 03-27-2016)   Seasonal allergies    Urgency of urination     Past Surgical History:  Procedure Laterality Date   ANKLE ARTHROSCOPY Right 02-13-2007    dr [name]   w/ debridement and removal loose body    COLONOSCOPY  last one summer 2019   [place] Right 1980s   INGUINAL LYMPH NODE BIOPSY  2001  approx.   benign   TRANSURETHRAL RESECTION OF BLADDER TUMOR N/A 02/06/2019   Procedure: TRANSURETHRAL RESECTION OF BLADDER TUMOR (TURBT);  Surgeon: Lucas Mallow, MD;  Location: [facility];  Service: Urology;  Laterality: N/A;   TRANSURETHRAL RESECTION OF BLADDER TUMOR N/A 02/20/2019   Procedure: TRANSURETHRAL RESECTION OF BLADDER TUMOR (TURBT);  Surgeon: Lucas Mallow, MD;  Location: [facility];  Service: Urology;  Laterality: N/A;   TRANSURETHRAL RESECTION OF BLADDER TUMOR N/A 03/13/2019   Procedure: TRANSURETHRAL RESECTION OF BLADDER TUMOR (TURBT);  Surgeon: Lucas Mallow, MD;  Location: [facility];  Service: Urology;  Laterality: N/A;   TRANSURETHRAL RESECTION OF BLADDER TUMOR N/A 11/20/2020   Procedure: TRANSURETHRAL RESECTION OF BLADDER TUMOR (TURBT) WITH INSTILLATION OF post-op GEMCITABINE;  Surgeon: Janith Lima, MD;  [facility]: [facility];  Service: Urology;  Laterality: N/A;   TRANSURETHRAL RESECTION OF BLADDER TUMOR WITH MITOMYCIN-C N/A 09/23/2019   Procedure: TRANSURETHRAL RESECTION OF BLADDER TUMOR WITH GEMCITABINE INSTILLATION INTO THE BLADDER;  Surgeon: Lucas Mallow, MD;  Location:  [place];  Service: Urology;  Laterality: N/A;   TRANSURETHRAL RESECTION OF BLADDER TUMOR WITH MITOMYCIN-C N/A 06/24/2020   Procedure: TRANSURETHRAL RESECTION OF BLADDER TUMOR WITH POST OPERATIVE GEMCITABINE INSTILLATION;  Surgeon: Lucas Mallow, MD;  Location: [place];  Service: Urology;  Laterality: N/A;    There were no vitals filed for this visit.   Subjective Assessment - 09/16/21 0948     Subjective COVID-19 screen performed prior to patient entering clinic. "Not really" hurting any today but achey and tired.    Pertinent History Bladder cancer, right ankle surgery, OA, hernia repair (right).    Limitations Standing    How  long can you sit comfortably? Up to an hour.    How long can you stand comfortably? Currently around 2 minutes per patient report.    How long can you walk comfortably? Been walking around home with FWW.    Patient Stated Goals Become as independent as possible and walk without assisitve device.    Currently in Pain? No/denies                [facility] PT Assessment - 09/16/21 0001       Assessment   Medical Diagnosis T8 to ilium fusion    Referring Provider (PT) Ina Homes MD    Onset Date/Surgical Date 08/19/21    Next MD Visit 12/08/2021      Precautions   Precautions Fall    Precaution Comments Follow fusion protocol.  Patient well educated in "BLT" (no bending, arching or twisting).      Restrictions   Weight Bearing Restrictions No                           [facility] Adult PT Treatment/Exercise - 09/16/21 0001       Lumbar Exercises: Aerobic   Nustep L4, seat 11 x15 min      Lumbar Exercises: Standing   Heel Raises 20 reps    Heel Raises Limitations B toe raise x20 rpes    Scapular Retraction Strengthening;Both;20 reps;Theraband    Theraband Level (Scapular Retraction) Level 3 (Green)      Knee/Hip Exercises: Standing   Hip Flexion AROM;Both;20 reps;Knee bent    Hip Abduction AROM;Both;20 reps;Knee straight    Hip Extension AROM;Both;20 reps;Knee straight    Forward Step Up Both;20 reps;Hand Hold: 2;Step Height: 4"      Knee/Hip Exercises: Seated   Long Arc Quad Strengthening;Both;20 reps    Long Arc Quad Weight 4 lbs.    Clamshell with TheraBand Green   x20 reps 5 sec holds   Sit to Sand 20 reps;without UE support   hinge technqiye                      PT Short Term Goals - 09/08/21 1235       PT SHORT TERM GOAL #1   Title Independent with an initial HEP.    Time 2    Period Weeks    Status New               PT Long Term Goals - 09/08/21 1236       PT LONG TERM GOAL #1   Title Independent with an  advanced HEP.    Time 8    Period Weeks    Status New      PT LONG TERM GOAL #2   Title Patient able  to stand 30 minutes.    Time 8    Period Weeks    Status New      PT LONG TERM GOAL #3   Title Perform ADL's with pain not > 3/10.    Time 8    Period Weeks    Status New      PT LONG TERM GOAL #4   Title Walk 500 feet with a straight cane include over uneven terrain (ie: lawn) and up/dowm 4 steps in a reciprocating fashion with railing.    Time 8    Period Weeks    Status New                   Plan - 09/16/21 1120     Clinical Impression Statement Patient presented in clinic with no mentionable pain. Patient was already reporting fatigue upon arrival. Limited with hip flexion in sitting but able to tolerate in standing WNL ROM. Intermittant rest breaks required throughout treatment in sitting. Patient was provided multiple VCs for posture and core bracing. Hip flexor stretching completed with lunge in standing but initially reported "razor blades" with stretch.    Personal Factors and Comorbidities Comorbidity 1    Comorbidities Bladder cancer, right ankle surgery, OA, hernia repair (right).    Examination-Activity Limitations Other;Locomotion Level;Stand    Examination-Participation Restrictions Other    Stability/Clinical Decision Making Stable/Uncomplicated    Rehab Potential Excellent    PT Frequency 2x / week    PT Duration 8 weeks    PT Treatment/Interventions ADLs/Self Care Home Management;Cryotherapy;Moist Heat;Therapeutic activities;Therapeutic exercise;Manual techniques;Patient/family education;Passive range of motion;Gait training;Stair training;Functional mobility training;Neuromuscular re-education    PT Next Visit Plan Continue progressive strengthening.    Consulted and Agree with Plan of Care Patient             Patient will benefit from skilled therapeutic intervention in order to improve the following deficits and impairments:  Abnormal gait,  Decreased activity tolerance, Postural dysfunction, Pain  Visit Diagnosis: Abnormal posture  Pain in left leg  Pain in right leg     Problem List Patient Active Problem List   Diagnosis Date Noted   Bladder tumor 02/06/2019   Arthritis of right ankle 07/15/2015   Septic arthritis of right ankle (HCC) 07/15/2015   MSSA (methicillin susceptible Staphylococcus aureus) infection 07/15/2015   Ankle fracture, right 07/15/2015   Traumatic osteoarthritis of right ankle 07/15/2015   Dysphagia, unspecified(787.20) 04/30/2014   Heartburn 04/30/2014   Shoulder impingement syndrome    GERD (gastroesophageal reflux disease)     Standley Brooking, PTA 09/16/2021, 11:25 AM  [facility], [place], [address] Phone: [phone]   Fax:  [phone]  Name: CORTAVIOUS NIX MRN: [id number] Date of Birth: May 19, 1951

## 2021-09-20 ENCOUNTER — Ambulatory Visit: Payer: PPO

## 2021-09-20 ENCOUNTER — Other Ambulatory Visit: Payer: Self-pay

## 2021-09-20 DIAGNOSIS — M79604 Pain in right leg: Secondary | ICD-10-CM

## 2021-09-20 DIAGNOSIS — R293 Abnormal posture: Secondary | ICD-10-CM

## 2021-09-20 DIAGNOSIS — M79605 Pain in left leg: Secondary | ICD-10-CM

## 2021-09-20 NOTE — Therapy (Signed)
[facility] [place], [place], [address] Phone: [phone]   Fax:  [phone]  Physical Therapy Treatment  Patient Details  Name: Jeremy Day MRN: [id number] Date of Birth: [birth year] Referring Provider (PT): Ina Homes MD   Encounter Date: 09/20/2021   PT End of Session - 09/20/21 0957     Visit Number 4    Number of Visits 16    Date for PT Re-Evaluation 12/07/21    Authorization Type FOTO AT LEAST EVERY 5TH VISIT.  PROGRESS NOTE AT 10TH VISIT.  KX MODIFIER AFTER 15 VISITS.    PT Start Time 0945    PT Stop Time 3846    PT Time Calculation (min) 44 min    Equipment Utilized During Treatment --    Activity Tolerance Patient tolerated treatment well    Behavior During Therapy WFL for tasks assessed/performed             Past Medical History:  Diagnosis Date   Abnormal tandem walk    using 2 canes due to lower back arthritis   Ascending aorta dilatation (HCC)    echo  04-11-2016  42mm and dilated aortc root 77mm   Bladder tumor    BPH (benign prostatic hyperplasia)    Cancer ([place]) 2020   bladder   Depression    Diverticulosis    Full dentures    GERD (gastroesophageal reflux disease)    Hiatal hernia    History of colon polyps    History of staphylococcal infection 2008   right ankle  w/ sepsis   Meningitis spinal age 49 or 4   OA (osteoarthritis)    lower back, right ankle,, bilateral knees and shouldres   Pneumonia yrs ago   Pre-diabetes    Psoriasis    PVC's (premature ventricular contractions)    2012-- hx bigeminy/ trigeminy with near syncope (cardiology consult note in epic dated 2012)  (last cardiology visit in epic w/ dr Marlou Porch, 03-27-2016)   Seasonal allergies    Urgency of urination     Past Surgical History:  Procedure Laterality Date   ANKLE ARTHROSCOPY Right 02-13-2007    dr [name]   w/ debridement and removal loose body   COLONOSCOPY  last one summer 2019   [place] Right 1980s   INGUINAL LYMPH NODE BIOPSY  2001  approx.   benign   TRANSURETHRAL RESECTION OF BLADDER TUMOR N/A 02/06/2019   Procedure: TRANSURETHRAL RESECTION OF BLADDER TUMOR (TURBT);  Surgeon: Lucas Mallow, MD;  [facility];  Service: Urology;  Laterality: N/A;   TRANSURETHRAL RESECTION OF BLADDER TUMOR N/A 02/20/2019   Procedure: TRANSURETHRAL RESECTION OF BLADDER TUMOR (TURBT);  Surgeon: Lucas Mallow, MD;  [facility];  Service: Urology;  Laterality: N/A;   TRANSURETHRAL RESECTION OF BLADDER TUMOR N/A 03/13/2019   Procedure: TRANSURETHRAL RESECTION OF BLADDER TUMOR (TURBT);  Surgeon: Lucas Mallow, MD;  [facility];  Service: Urology;  Laterality: N/A;   TRANSURETHRAL RESECTION OF BLADDER TUMOR N/A 11/20/2020   Procedure: TRANSURETHRAL RESECTION OF BLADDER TUMOR (TURBT) WITH INSTILLATION OF post-op GEMCITABINE;  Surgeon: Janith Lima, MD;  Location: [facility];  Service: Urology;  Laterality: N/A;   TRANSURETHRAL RESECTION OF BLADDER TUMOR WITH MITOMYCIN-C N/A 09/23/2019   Procedure: TRANSURETHRAL RESECTION OF BLADDER TUMOR WITH GEMCITABINE INSTILLATION INTO THE BLADDER;  Surgeon: Lucas Mallow, MD;  Location: [facility];  Service: Urology;  Laterality: N/A;  TRANSURETHRAL RESECTION OF BLADDER TUMOR WITH MITOMYCIN-C N/A 06/24/2020   Procedure: TRANSURETHRAL RESECTION OF BLADDER TUMOR WITH POST OPERATIVE GEMCITABINE INSTILLATION;  Surgeon: Lucas Mallow, MD;  Location: [facility];  Service: Urology;  Laterality: N/A;    There were no vitals filed for this visit.   Subjective Assessment - 09/20/21 0951     Subjective COVID-19 screen performed prior to patient entering clinic. Patient reports that he feels alright today.    Pertinent History Bladder cancer, right ankle surgery, OA, hernia repair (right).    Limitations Standing    How long can you sit comfortably? Up to an hour.    How long  can you stand comfortably? Currently around 2 minutes per patient report.    How long can you walk comfortably? Been walking around home with FWW.    Patient Stated Goals Become as independent as possible and walk without assisitve device.    Currently in Pain? No/denies                               [facility] Adult PT Treatment/Exercise - 09/20/21 0001       Lumbar Exercises: Aerobic   Nustep L5 x 15 minutes; seat 11      Lumbar Exercises: Standing   Heel Raises 20 reps;10 reps    Heel Raises Limitations B toe raise x30 reps      Knee/Hip Exercises: Standing   Hip Flexion Both;20 reps;Knee bent    Hip Flexion Limitations Standing on foam pad    Forward Lunges Both;15 reps;4 seconds   onto 6" step; BUE support   Hip Abduction Stengthening;Both;20 reps;Knee straight    Abduction Limitations Red t-band around ankles    Forward Step Up Both;Step Height: 4";Hand Hold: 2   24 reps each   Other Standing Knee Exercises Knee flexion   22 reps each; 4 lbs weights     Knee/Hip Exercises: Seated   Long Arc Quad Strengthening;Both;20 reps    Long Arc Quad Weight 5 lbs.                       PT Short Term Goals - 09/08/21 1235       PT SHORT TERM GOAL #1   Title Independent with an initial HEP.    Time 2    Period Weeks    Status New               PT Long Term Goals - 09/08/21 1236       PT LONG TERM GOAL #1   Title Independent with an advanced HEP.    Time 8    Period Weeks    Status New      PT LONG TERM GOAL #2   Title Patient able to stand 30 minutes.    Time 8    Period Weeks    Status New      PT LONG TERM GOAL #3   Title Perform ADL's with pain not > 3/10.    Time 8    Period Weeks    Status New      PT LONG TERM GOAL #4   Title Walk 500 feet with a straight cane include over uneven terrain (ie: lawn) and up/dowm 4 steps in a reciprocating fashion with railing.    Time 8    Period Weeks    Status New  Plan - 09/20/21 0957     Clinical Impression Statement Patient was progressed with multiple familiar interventions for improved  lower extremity strength. He required minimal cuing with resisted hip abduction for improved eccentric hip control to prevent from dragging his foot when returning to neutral. He reported a mild increase in bilateral hip flexor tightness with today's interventions, but this was able to be reduced with a forward lunge onto a six inch step. He reported that his legs "felt like noodles" upon the conclusion of treatment. However, "they feel a whole lot better now" compaired to the beginning of treatment. He continues to require skilled physical therapy to address his remaining impairments to maximize his functional mobility.    Personal Factors and Comorbidities Comorbidity 1    Comorbidities Bladder cancer, right ankle surgery, OA, hernia repair (right).    Examination-Activity Limitations Other;Locomotion Level;Stand    Examination-Participation Restrictions Other    Stability/Clinical Decision Making Stable/Uncomplicated    Rehab Potential Excellent    PT Frequency 2x / week    PT Duration 8 weeks    PT Treatment/Interventions ADLs/Self Care Home Management;Cryotherapy;Moist Heat;Therapeutic activities;Therapeutic exercise;Manual techniques;Patient/family education;Passive range of motion;Gait training;Stair training;Functional mobility training;Neuromuscular re-education    PT Next Visit Plan Continue progressive strengthening.    Consulted and Agree with Plan of Care Patient             Patient will benefit from skilled therapeutic intervention in order to improve the following deficits and impairments:  Abnormal gait, Decreased activity tolerance, Postural dysfunction, Pain  Visit Diagnosis: Abnormal posture  Pain in left leg  Pain in right leg     Problem List Patient Active Problem List   Diagnosis Date Noted   Bladder tumor  02/06/2019   Arthritis of right ankle 07/15/2015   Septic arthritis of right ankle (HCC) 07/15/2015   MSSA (methicillin susceptible Staphylococcus aureus) infection 07/15/2015   Ankle fracture, right 07/15/2015   Traumatic osteoarthritis of right ankle 07/15/2015   Dysphagia, unspecified(787.20) 04/30/2014   Heartburn 04/30/2014   Shoulder impingement syndrome    GERD (gastroesophageal reflux disease)     Darlin Coco, PT 09/20/2021, 10:39 AM  [facility] [place], [place], [address] Phone: [phone]   Fax:  [phone]  Name: Jeremy Day MRN: [id number] Date of Birth: 1951-03-17

## 2021-09-23 DIAGNOSIS — M419 Scoliosis, unspecified: Secondary | ICD-10-CM | POA: Diagnosis not present

## 2021-09-24 ENCOUNTER — Other Ambulatory Visit: Payer: Self-pay

## 2021-09-24 ENCOUNTER — Ambulatory Visit: Payer: PPO | Admitting: Physical Therapy

## 2021-09-24 ENCOUNTER — Encounter: Payer: Self-pay | Admitting: Physical Therapy

## 2021-09-24 DIAGNOSIS — R293 Abnormal posture: Secondary | ICD-10-CM | POA: Diagnosis not present

## 2021-09-24 DIAGNOSIS — M79605 Pain in left leg: Secondary | ICD-10-CM

## 2021-09-24 DIAGNOSIS — M79604 Pain in right leg: Secondary | ICD-10-CM

## 2021-09-24 NOTE — Therapy (Signed)
[facility] [place], [place], [address] Phone: [phone]   Fax:  [phone]  Physical Therapy Treatment  Patient Details  Name: Jeremy Day MRN: [id number] Date of Birth: 10/07/1951 Referring Provider (PT): Ina Homes MD   Encounter Date: 09/24/2021   PT End of Session - 09/24/21 1029     Visit Number 5    Number of Visits 16    Date for PT Re-Evaluation 12/07/21    Authorization Type FOTO AT LEAST EVERY 5TH VISIT.  PROGRESS NOTE AT 10TH VISIT.  KX MODIFIER AFTER 15 VISITS.    PT Start Time [phone]    PT Stop Time 1028    PT Time Calculation (min) 42 min    Equipment Utilized During Treatment Other (comment)    Activity Tolerance Patient tolerated treatment well    Behavior During Therapy WFL for tasks assessed/performed             Past Medical History:  Diagnosis Date   Abnormal tandem walk    using 2 canes due to lower back arthritis   Ascending aorta dilatation (HCC)    echo  04-11-2016  8mm and dilated aortc root 69mm   Bladder tumor    BPH (benign prostatic hyperplasia)    Cancer ([place]) 2020   bladder   Depression    Diverticulosis    Full dentures    GERD (gastroesophageal reflux disease)    Hiatal hernia    History of colon polyps    History of staphylococcal infection 2008   right ankle  w/ sepsis   Meningitis spinal age 81 or 4   OA (osteoarthritis)    lower back, right ankle,, bilateral knees and shouldres   Pneumonia yrs ago   Pre-diabetes    Psoriasis    PVC's (premature ventricular contractions)    2012-- hx bigeminy/ trigeminy with near syncope (cardiology consult note in epic dated 2012)  (last cardiology visit in epic w/ dr Marlou Porch, 03-27-2016)   Seasonal allergies    Urgency of urination     Past Surgical History:  Procedure Laterality Date   ANKLE ARTHROSCOPY Right 02-13-2007    dr [name]   w/ debridement and removal loose body   COLONOSCOPY  last one summer 2019    [place] Right 1980s   INGUINAL LYMPH NODE BIOPSY  2001  approx.   benign   TRANSURETHRAL RESECTION OF BLADDER TUMOR N/A 02/06/2019   Procedure: TRANSURETHRAL RESECTION OF BLADDER TUMOR (TURBT);  Surgeon: Lucas Mallow, MD;  Location: [facility];  Service: Urology;  Laterality: N/A;   TRANSURETHRAL RESECTION OF BLADDER TUMOR N/A 02/20/2019   Procedure: TRANSURETHRAL RESECTION OF BLADDER TUMOR (TURBT);  Surgeon: Lucas Mallow, MD;  Location: [facility];  Service: Urology;  Laterality: N/A;   TRANSURETHRAL RESECTION OF BLADDER TUMOR N/A 03/13/2019   Procedure: TRANSURETHRAL RESECTION OF BLADDER TUMOR (TURBT);  Surgeon: Lucas Mallow, MD;  Location: [facility];  Service: Urology;  Laterality: N/A;   TRANSURETHRAL RESECTION OF BLADDER TUMOR N/A 11/20/2020   Procedure: TRANSURETHRAL RESECTION OF BLADDER TUMOR (TURBT) WITH INSTILLATION OF post-op GEMCITABINE;  Surgeon: Janith Lima, MD;  Location: [facility];  Service: Urology;  Laterality: N/A;   TRANSURETHRAL RESECTION OF BLADDER TUMOR WITH MITOMYCIN-C N/A 09/23/2019   Procedure: TRANSURETHRAL RESECTION OF BLADDER TUMOR WITH GEMCITABINE INSTILLATION INTO THE BLADDER;  Surgeon: Lucas Mallow, MD;  Location: [facility];  Service: Urology;  Laterality:  N/A;   TRANSURETHRAL RESECTION OF BLADDER TUMOR WITH MITOMYCIN-C N/A 06/24/2020   Procedure: TRANSURETHRAL RESECTION OF BLADDER TUMOR WITH POST OPERATIVE GEMCITABINE INSTILLATION;  Surgeon: Lucas Mallow, MD;  Location: [facility];  Service: Urology;  Laterality: N/A;    There were no vitals filed for this visit.   Subjective Assessment - 09/24/21 0944     Subjective COVID-19 screen performed prior to patient entering clinic. Minimal pain in low back.    Pertinent History Bladder cancer, right ankle surgery, OA, hernia repair (right).    Limitations Standing    How long can you sit comfortably? Up to an hour.    How long can you  stand comfortably? Currently around 2 minutes per patient report.    How long can you walk comfortably? Been walking around home with FWW.    Patient Stated Goals Become as independent as possible and walk without assisitve device.    Currently in Pain? Yes    Pain Score 1     Pain Location Back    Pain Orientation Right;Left;Lower    Pain Descriptors / Indicators Discomfort    Pain Type Surgical pain    Pain Onset More than a month ago    Pain Frequency Constant                [facility] PT Assessment - 09/24/21 0001       Assessment   Medical Diagnosis T8 to ilium fusion    Referring Provider (PT) Ina Homes MD    Onset Date/Surgical Date 08/19/21    Next MD Visit 12/08/2021      Precautions   Precautions Fall    Precaution Comments Follow fusion protocol.  Patient well educated in "BLT" (no bending, arching or twisting).                           [facility] Adult PT Treatment/Exercise - 09/24/21 0001       Lumbar Exercises: Stretches   Hip Flexor Stretch Right;Left;4 reps;20 seconds;Limitations    Hip Flexor Stretch Limitations standing with 8" step      Lumbar Exercises: Aerobic   UBE (Upper Arm Bike) 60 RPM x4 min (forward/backward)    Nustep L5 x 15 minutes; seat 11      Lumbar Exercises: Machines for Strengthening   Cybex Knee Extension 10# x20 reps    Cybex Knee Flexion 30# x20 reps      Knee/Hip Exercises: Standing   Hip Flexion Both;20 reps;Knee bent    Hip Flexion Limitations yellow theraband    Hip Abduction Stengthening;Both;20 reps;Knee straight    Abduction Limitations yellow theraband    Forward Step Up Both;15 reps;Hand Hold: 2;Step Height: 6"    Step Down Both;10 reps;Hand Hold: 2;Step Height: 4"      Knee/Hip Exercises: Seated   Sit to Sand 15 reps;with UE support   min UE support; green theraband                      PT Short Term Goals - 09/08/21 1235       PT SHORT TERM GOAL #1   Title Independent  with an initial HEP.    Time 2    Period Weeks    Status New               PT Long Term Goals - 09/08/21 1236       PT LONG  TERM GOAL #1   Title Independent with an advanced HEP.    Time 8    Period Weeks    Status New      PT LONG TERM GOAL #2   Title Patient able to stand 30 minutes.    Time 8    Period Weeks    Status New      PT LONG TERM GOAL #3   Title Perform ADL's with pain not > 3/10.    Time 8    Period Weeks    Status New      PT LONG TERM GOAL #4   Title Walk 500 feet with a straight cane include over uneven terrain (ie: lawn) and up/dowm 4 steps in a reciprocating fashion with railing.    Time 8    Period Weeks    Status New                   Plan - 09/24/21 1036     Clinical Impression Statement Patient progressed to more functional LE and UE strengthening. More focus on hip flexors and quads as he reports that weakness in both muscle groups is his greatest limitation at the time. Patient requiring intermittant rest breaks due to fatigue. Min UE support required for sit <> stand but able to control stand <> sit.    Personal Factors and Comorbidities Comorbidity 1    Comorbidities Bladder cancer, right ankle surgery, OA, hernia repair (right).    Examination-Activity Limitations Other;Locomotion Level;Stand    Examination-Participation Restrictions Other    Stability/Clinical Decision Making Stable/Uncomplicated    Rehab Potential Excellent    PT Frequency 2x / week    PT Duration 8 weeks    PT Treatment/Interventions ADLs/Self Care Home Management;Cryotherapy;Moist Heat;Therapeutic activities;Therapeutic exercise;Manual techniques;Patient/family education;Passive range of motion;Gait training;Stair training;Functional mobility training;Neuromuscular re-education    PT Next Visit Plan Continue progressive strengthening.    Consulted and Agree with Plan of Care Patient             Patient will benefit from skilled therapeutic  intervention in order to improve the following deficits and impairments:  Abnormal gait, Decreased activity tolerance, Postural dysfunction, Pain  Visit Diagnosis: Abnormal posture  Pain in left leg  Pain in right leg     Problem List Patient Active Problem List   Diagnosis Date Noted   Bladder tumor 02/06/2019   Arthritis of right ankle 07/15/2015   Septic arthritis of right ankle (HCC) 07/15/2015   MSSA (methicillin susceptible Staphylococcus aureus) infection 07/15/2015   Ankle fracture, right 07/15/2015   Traumatic osteoarthritis of right ankle 07/15/2015   Dysphagia, unspecified(787.20) 04/30/2014   Heartburn 04/30/2014   Shoulder impingement syndrome    GERD (gastroesophageal reflux disease)     Standley Brooking, PTA 09/24/2021, 10:43 AM  [facility] [place], [place], [address] Phone: [phone]   Fax:  [phone]  Name: Jeremy Day MRN: [id number] Date of Birth: May 01, 1951

## 2021-09-29 ENCOUNTER — Ambulatory Visit: Payer: PPO | Admitting: *Deleted

## 2021-09-29 ENCOUNTER — Other Ambulatory Visit: Payer: Self-pay

## 2021-09-29 DIAGNOSIS — M79605 Pain in left leg: Secondary | ICD-10-CM

## 2021-09-29 DIAGNOSIS — R293 Abnormal posture: Secondary | ICD-10-CM

## 2021-09-29 DIAGNOSIS — M79604 Pain in right leg: Secondary | ICD-10-CM

## 2021-09-29 NOTE — Therapy (Signed)
[facility] [place], [place], [address] Phone: [phone]   Fax:  [phone]  Physical Therapy Treatment  Patient Details  Name: Jeremy Day MRN: [id number] Date of Birth: 07-30-1951 Referring Provider (PT): Ina Homes MD   Encounter Date: 09/29/2021   PT End of Session - 09/29/21 0953     Visit Number 6    Number of Visits 16    Date for PT Re-Evaluation 12/07/21    Authorization Type FOTO AT LEAST EVERY 5TH VISIT.  PROGRESS NOTE AT 10TH VISIT.  KX MODIFIER AFTER 15 VISITS.    PT Start Time 0945    PT Stop Time 1033    PT Time Calculation (min) 48 min             Past Medical History:  Diagnosis Date   Abnormal tandem walk    using 2 canes due to lower back arthritis   Ascending aorta dilatation (HCC)    echo  04-11-2016  21mm and dilated aortc root 77mm   Bladder tumor    BPH (benign prostatic hyperplasia)    Cancer ([place]) 2020   bladder   Depression    Diverticulosis    Full dentures    GERD (gastroesophageal reflux disease)    Hiatal hernia    History of colon polyps    History of staphylococcal infection 2008   right ankle  w/ sepsis   Meningitis spinal age 71 or 4   OA (osteoarthritis)    lower back, right ankle,, bilateral knees and shouldres   Pneumonia yrs ago   Pre-diabetes    Psoriasis    PVC's (premature ventricular contractions)    2012-- hx bigeminy/ trigeminy with near syncope (cardiology consult note in epic dated 2012)  (last cardiology visit in epic w/ dr Marlou Porch, 03-27-2016)   Seasonal allergies    Urgency of urination     Past Surgical History:  Procedure Laterality Date   ANKLE ARTHROSCOPY Right 02-13-2007    dr [name]   w/ debridement and removal loose body   COLONOSCOPY  last one summer 2019   [place] Right 1980s   INGUINAL LYMPH NODE BIOPSY  2001  approx.   benign   TRANSURETHRAL RESECTION OF BLADDER TUMOR N/A 02/06/2019   Procedure:  TRANSURETHRAL RESECTION OF BLADDER TUMOR (TURBT);  Surgeon: Lucas Mallow, MD;  Location: [facility];  Service: Urology;  Laterality: N/A;   TRANSURETHRAL RESECTION OF BLADDER TUMOR N/A 02/20/2019   Procedure: TRANSURETHRAL RESECTION OF BLADDER TUMOR (TURBT);  Surgeon: Lucas Mallow, MD;  Location: [facility];  Service: Urology;  Laterality: N/A;   TRANSURETHRAL RESECTION OF BLADDER TUMOR N/A 03/13/2019   Procedure: TRANSURETHRAL RESECTION OF BLADDER TUMOR (TURBT);  Surgeon: Lucas Mallow, MD;  Location: [facility];  Service: Urology;  Laterality: N/A;   TRANSURETHRAL RESECTION OF BLADDER TUMOR N/A 11/20/2020   Procedure: TRANSURETHRAL RESECTION OF BLADDER TUMOR (TURBT) WITH INSTILLATION OF post-op GEMCITABINE;  Surgeon: Janith Lima, MD;  Location: [facility];  Service: Urology;  Laterality: N/A;   TRANSURETHRAL RESECTION OF BLADDER TUMOR WITH MITOMYCIN-C N/A 09/23/2019   Procedure: TRANSURETHRAL RESECTION OF BLADDER TUMOR WITH GEMCITABINE INSTILLATION INTO THE BLADDER;  Surgeon: Lucas Mallow, MD;  Location: [facility];  Service: Urology;  Laterality: N/A;   TRANSURETHRAL RESECTION OF BLADDER TUMOR WITH MITOMYCIN-C N/A 06/24/2020   Procedure: TRANSURETHRAL RESECTION OF BLADDER TUMOR WITH POST OPERATIVE GEMCITABINE INSTILLATION;  Surgeon: Gloriann Loan,  Desiree Hane, MD;  Location: [facility];  Service: Urology;  Laterality: N/A;    There were no vitals filed for this visit.   Subjective Assessment - 09/29/21 0950     Subjective COVID-19 screen performed prior to patient entering clinic. Minimal pain in low back.    Pertinent History Bladder cancer, right ankle surgery, OA, hernia repair (right).    Limitations Standing    How long can you sit comfortably? Up to an hour.    How long can you stand comfortably? Currently around 2 minutes per patient report.    How long can you walk comfortably? Been walking around home with FWW.    Currently in Pain?  Yes    Pain Score 2     Pain Location Back    Pain Orientation Left;Right    Pain Type Surgical pain    Pain Onset More than a month ago                               [facility] Adult PT Treatment/Exercise - 09/29/21 0001       Lumbar Exercises: Stretches   Hip Flexor Stretch Right;Left;4 reps;20 seconds;Limitations    Hip Flexor Stretch Limitations standing with 14"step      Lumbar Exercises: Aerobic   Nustep L5 x 15 minutes; seat 11      Lumbar Exercises: Standing   Heel Raises 20 reps;10 reps    Heel Raises Limitations B toe raise x30 reps    Shoulder Extension Strengthening   Blue XTS 2x10 with focus on AB bracing     Knee/Hip Exercises: Standing   Forward Step Up Both;Hand Hold: 2;Step Height: 6";20 reps    Step Down Both;10 reps;Hand Hold: 2;Step Height: 4"    Walking with Sports Cord side stepping Blue XTS x10 each side      Knee/Hip Exercises: Seated   Sit to Sand with UE support;20 reps   min UE support; Focus on glutes                      PT Short Term Goals - 09/08/21 1235       PT SHORT TERM GOAL #1   Title Independent with an initial HEP.    Time 2    Period Weeks    Status New               PT Long Term Goals - 09/08/21 1236       PT LONG TERM GOAL #1   Title Independent with an advanced HEP.    Time 8    Period Weeks    Status New      PT LONG TERM GOAL #2   Title Patient able to stand 30 minutes.    Time 8    Period Weeks    Status New      PT LONG TERM GOAL #3   Title Perform ADL's with pain not > 3/10.    Time 8    Period Weeks    Status New      PT LONG TERM GOAL #4   Title Walk 500 feet with a straight cane include over uneven terrain (ie: lawn) and up/dowm 4 steps in a reciprocating fashion with railing.    Time 8    Period Weeks    Status New  Plan - 09/29/21 0953     Clinical Impression Statement Pt arrived today doing fairy well and was able to continue with  core and LE exs. He did well with all exs without increased pain. Focused on glute activation during sit to stand.    Personal Factors and Comorbidities Comorbidity 1    Comorbidities Bladder cancer, right ankle surgery, OA, hernia repair (right).    Examination-Activity Limitations Other;Locomotion Level;Stand    Stability/Clinical Decision Making Stable/Uncomplicated    Rehab Potential Excellent    PT Frequency 2x / week    PT Treatment/Interventions ADLs/Self Care Home Management;Cryotherapy;Moist Heat;Therapeutic activities;Therapeutic exercise;Manual techniques;Patient/family education;Passive range of motion;Gait training;Stair training;Functional mobility training;Neuromuscular re-education    PT Next Visit Plan Continue progressive strengthening.    Consulted and Agree with Plan of Care Patient             Patient will benefit from skilled therapeutic intervention in order to improve the following deficits and impairments:  Abnormal gait, Decreased activity tolerance, Postural dysfunction, Pain  Visit Diagnosis: Abnormal posture  Pain in left leg  Pain in right leg     Problem List Patient Active Problem List   Diagnosis Date Noted   Bladder tumor 02/06/2019   Arthritis of right ankle 07/15/2015   Septic arthritis of right ankle (HCC) 07/15/2015   MSSA (methicillin susceptible Staphylococcus aureus) infection 07/15/2015   Ankle fracture, right 07/15/2015   Traumatic osteoarthritis of right ankle 07/15/2015   Dysphagia, unspecified(787.20) 04/30/2014   Heartburn 04/30/2014   Shoulder impingement syndrome    GERD (gastroesophageal reflux disease)     Camary Sosa,CHRIS, PTA 09/29/2021, 10:46 AM  [facility] [address] [place], [place], [address] Phone: [phone]   Fax:  [phone]  Name: Jeremy Day MRN: [id number] Date of Birth: 06/13/1951

## 2021-10-04 ENCOUNTER — Ambulatory Visit: Payer: PPO | Admitting: Physical Therapy

## 2021-10-04 ENCOUNTER — Encounter: Payer: Self-pay | Admitting: Physical Therapy

## 2021-10-04 ENCOUNTER — Other Ambulatory Visit: Payer: Self-pay

## 2021-10-04 DIAGNOSIS — R293 Abnormal posture: Secondary | ICD-10-CM

## 2021-10-04 DIAGNOSIS — M79604 Pain in right leg: Secondary | ICD-10-CM

## 2021-10-04 DIAGNOSIS — M79605 Pain in left leg: Secondary | ICD-10-CM

## 2021-10-04 NOTE — Therapy (Signed)
[facility] [place], [place], [address] Phone: [phone]   Fax:  [phone]  Physical Therapy Treatment  Patient Details  Name: Jeremy Day MRN: [id number] Date of Birth: 1951/09/26 Referring Provider (PT): Ina Homes MD   Encounter Date: 10/04/2021   PT End of Session - 10/04/21 1430     Visit Number 7    Number of Visits 16    Date for PT Re-Evaluation 12/07/21    Authorization Type FOTO AT LEAST EVERY 5TH VISIT.  PROGRESS NOTE AT 10TH VISIT.  KX MODIFIER AFTER 15 VISITS.    PT Start Time 1427    PT Stop Time 1510    PT Time Calculation (min) 43 min    Equipment Utilized During Treatment Other (comment)   unilateral SPC   Activity Tolerance Patient tolerated treatment well    Behavior During Therapy WFL for tasks assessed/performed             Past Medical History:  Diagnosis Date   Abnormal tandem walk    using 2 canes due to lower back arthritis   Ascending aorta dilatation (HCC)    echo  04-11-2016  14m and dilated aortc root 313m  Bladder tumor    BPH (benign prostatic hyperplasia)    [place]   [place]   Depression    Diverticulosis    Full dentures    GERD (gastroesophageal reflux disease)    Hiatal hernia    History of colon polyps    History of staphylococcal infection 2008   right ankle  w/ sepsis   Meningitis spinal age 32 67r 4   OA (osteoarthritis)    lower back, right ankle,, bilateral knees and shouldres   Pneumonia yrs ago   Pre-diabetes    Psoriasis    PVC's (premature ventricular contractions)    2012-- hx bigeminy/ trigeminy with near syncope (cardiology consult note in epic dated 2012)  (last cardiology visit in epic w/ dr skMarlou Porch05-21-2017)   Seasonal allergies    Urgency of urination     Past Surgical History:  Procedure Laterality Date   ANKLE ARTHROSCOPY Right 02-13-2007    dr grBerenice Primas w/ debridement and removal loose body   [place]   INGUINAL LYMPH NODE BIOPSY  2001  approx.   benign   TRANSURETHRAL RESECTION OF BLADDER TUMOR N/A 02/06/2019   Procedure: TRANSURETHRAL RESECTION OF BLADDER TUMOR (TURBT);  Surgeon: BeLucas MallowMD;  Location: [facility];  Service: Urology;  Laterality: N/A;   TRANSURETHRAL RESECTION OF BLADDER TUMOR N/A 02/20/2019   Procedure: TRANSURETHRAL RESECTION OF BLADDER TUMOR (TURBT);  Surgeon: BeLucas MallowMD;  Location: [facility];  Service: Urology;  Laterality: N/A;   TRANSURETHRAL RESECTION OF BLADDER TUMOR N/A 03/13/2019   Procedure: TRANSURETHRAL RESECTION OF BLADDER TUMOR (TURBT);  Surgeon: BeLucas MallowMD;  [facility];  Service: Urology;  Laterality: N/A;   TRANSURETHRAL RESECTION OF BLADDER TUMOR N/A 11/20/2020   Procedure: TRANSURETHRAL RESECTION OF BLADDER TUMOR (TURBT) WITH INSTILLATION OF post-op GEMCITABINE;  Surgeon: GaJanith LimaMD;  [facility]: [facility]rology;  Laterality: N/A;   TRANSURETHRAL RESECTION OF BLADDER TUMOR WITH MITOMYCIN-C N/A 09/23/2019   Procedure: TRANSURETHRAL RESECTION OF BLADDER TUMOR WITH GEMCITABINE INSTILLATION INTO THE BLADDER;  Surgeon: BeLucas MallowMD;  [place] [facility] Service:  Urology;  Laterality: N/A;   TRANSURETHRAL RESECTION OF BLADDER TUMOR WITH MITOMYCIN-C N/A 06/24/2020   Procedure: TRANSURETHRAL RESECTION OF BLADDER TUMOR WITH POST OPERATIVE GEMCITABINE INSTILLATION;  Surgeon: Lucas Mallow, MD;  Location: [facility] [place];  Service: Urology;  Laterality: N/A;    There were no vitals filed for this visit.   Subjective Assessment - 10/04/21 1428     Subjective COVID-19 screen performed prior to patient entering clinic. Reports minimal ache in low back and across his hips.    Pertinent History Bladder cancer, right ankle surgery, OA, hernia repair (right).    Limitations Standing    How long can you sit  comfortably? Up to an hour.    How long can you stand comfortably? Currently around 2 minutes per patient report.    How long can you walk comfortably? Been walking around home with FWW.    Patient Stated Goals Become as independent as possible and walk without assisitve device.    Currently in Pain? Yes    Pain Score --   no pain score provided   Pain Location Back    Pain Orientation Lower    Pain Descriptors / Indicators Aching    Pain Type Chronic pain    Pain Onset More than a month ago    Pain Frequency Intermittent                [facility] PT Assessment - 10/04/21 0001       Assessment   Medical Diagnosis T8 to ilium fusion    Referring Provider (PT) Ina Homes MD    Onset Date/Surgical Date 08/19/21    Next MD Visit 12/08/2021      Precautions   Precautions Fall    Precaution Comments Follow fusion protocol.  Patient well educated in "BLT" (no bending, arching or twisting).                           [facility] Adult PT Treatment/Exercise - 10/04/21 0001       Lumbar Exercises: Aerobic   UBE (Upper Arm Bike) 60 RPM x6 min total standing (forward/backward)    Nustep L6 x 15 minutes      Lumbar Exercises: Machines for Strengthening   Cybex Knee Extension 10# 2x10 reps    Cybex Knee Flexion 30# 2x10 reps      Lumbar Exercises: Standing   Forward Lunge 10 reps;2 seconds    Forward Lunge Limitations OH reach with red ball    Shoulder Extension Strengthening;Both;20 reps;Limitations    Shoulder Extension Limitations Blue XTS with ab bracing      Knee/Hip Exercises: Standing   Hip Abduction Stengthening;Both;2 sets;10 reps;Knee straight;Limitations    Abduction Limitations red theraband    Hip Extension Stengthening;Both;2 sets;10 reps;Knee straight;Limitations    Walking with Sports Cord B sidestepping x10 reps Blue XTS      Knee/Hip Exercises: Seated   Sit to Sand 10 reps;without UE support   holding ball; focus on glute                       PT Short Term Goals - 09/08/21 1235       PT SHORT TERM GOAL #1   Title Independent with an initial HEP.    Time 2    Period Weeks    Status New               PT Long  Term Goals - 10/04/21 1439       PT LONG TERM GOAL #1   Title Independent with an advanced HEP.    Time 8    Period Weeks    Status On-going      PT LONG TERM GOAL #2   Title Patient able to stand 30 minutes.    Time 8    Period Weeks    Status Partially Met      PT LONG TERM GOAL #3   Title Perform ADL's with pain not > 3/10.    Time 8    Period Weeks    Status Achieved      PT LONG TERM GOAL #4   Title Walk 500 feet with a straight cane include over uneven terrain (ie: lawn) and up/dowm 4 steps in a reciprocating fashion with railing.    Time 8    Period Weeks    Status On-going                   Plan - 10/04/21 1513     Clinical Impression Statement Patient presented in clinic with reports of minimal ache across low back and B hips. Patient progressed to standing exercises for posture as well as balance which was more challenging than anticipated due to multiple fractures of the R ankle/foot previously. Patient VCd throughout therex for glute activation and posture while in standing. Patient reported LE muscle fatigue by the end of therex.    Personal Factors and Comorbidities Comorbidity 1    Comorbidities Bladder cancer, right ankle surgery, OA, hernia repair (right).    Examination-Activity Limitations Other;Locomotion Level;Stand    Examination-Participation Restrictions Other    Stability/Clinical Decision Making Stable/Uncomplicated    Rehab Potential Excellent    PT Frequency 2x / week    PT Duration 8 weeks    PT Treatment/Interventions ADLs/Self Care Home Management;Cryotherapy;Moist Heat;Therapeutic activities;Therapeutic exercise;Manual techniques;Patient/family education;Passive range of motion;Gait training;Stair training;Functional mobility  training;Neuromuscular re-education    PT Next Visit Plan Continue progressive strengthening.    Consulted and Agree with Plan of Care Patient             Patient will benefit from skilled therapeutic intervention in order to improve the following deficits and impairments:  Abnormal gait, Decreased activity tolerance, Postural dysfunction, Pain  Visit Diagnosis: Abnormal posture  Pain in left leg  Pain in right leg     Problem List Patient Active Problem List   Diagnosis Date Noted   Bladder tumor 02/06/2019   Arthritis of right ankle 07/15/2015   Septic arthritis of right ankle (HCC) 07/15/2015   MSSA (methicillin susceptible Staphylococcus aureus) infection 07/15/2015   Ankle fracture, right 07/15/2015   Traumatic osteoarthritis of right ankle 07/15/2015   Dysphagia, unspecified(787.20) 04/30/2014   Heartburn 04/30/2014   Shoulder impingement syndrome    GERD (gastroesophageal reflux disease)     Standley Brooking, PTA 10/04/2021, 3:20 PM  [facility] [address] [place], [place], [address] Phone: [phone]   Fax:  [phone]  Name: Jeremy Day MRN: [id number] Date of Birth: 02/28/1951

## 2021-10-06 NOTE — Progress Notes (Signed)
Office Visit    Patient Name: Jeremy Day Date of Encounter: 10/07/2021  PCP:  [name], MD (Inactive)   [place]  Cardiologist:  Candee Furbish, MD  Advanced Practice Provider:  No care team member to display Electrophysiologist:  None    Chief Complaint   Jeremy Day is a 70 y.o. male with a hx of aortic root dilation, ventricular bigeminy presents today for follow up of PVCs.   Past Medical History    Past Medical History:  Diagnosis Date   Abnormal tandem walk    using 2 canes due to lower back arthritis   Ascending aorta dilatation (HCC)    echo  04-11-2016  37mm and dilated aortc root 60mm   Bladder tumor    BPH (benign prostatic hyperplasia)    Cancer ([place]) 2020   bladder   Depression    Diverticulosis    Full dentures    GERD (gastroesophageal reflux disease)    Hiatal hernia    History of colon polyps    History of staphylococcal infection 2008   right ankle  w/ sepsis   Meningitis spinal age 30 or 4   OA (osteoarthritis)    lower back, right ankle,, bilateral knees and shouldres   Pneumonia yrs ago   Pre-diabetes    Psoriasis    PVC's (premature ventricular contractions)    2012-- hx bigeminy/ trigeminy with near syncope (cardiology consult note in epic dated 2012)  (last cardiology visit in epic w/ dr Marlou Porch, 03-27-2016)   Seasonal allergies    Urgency of urination    Past Surgical History:  Procedure Laterality Date   ANKLE ARTHROSCOPY Right 02-13-2007    dr [name]   w/ debridement and removal loose body   COLONOSCOPY  last one summer 2019   [place] Right 1980s   INGUINAL LYMPH NODE BIOPSY  2001  approx.   benign   TRANSURETHRAL RESECTION OF BLADDER TUMOR N/A 02/06/2019   Procedure: TRANSURETHRAL RESECTION OF BLADDER TUMOR (TURBT);  Surgeon: Lucas Mallow, MD;  [facility];  Service: Urology;  Laterality: N/A;   TRANSURETHRAL RESECTION OF BLADDER TUMOR N/A 02/20/2019   Procedure:  TRANSURETHRAL RESECTION OF BLADDER TUMOR (TURBT);  Surgeon: Lucas Mallow, MD;  [facility];  Service: Urology;  Laterality: N/A;   TRANSURETHRAL RESECTION OF BLADDER TUMOR N/A 03/13/2019   Procedure: TRANSURETHRAL RESECTION OF BLADDER TUMOR (TURBT);  Surgeon: Lucas Mallow, MD;  [facility];  Service: Urology;  Laterality: N/A;   TRANSURETHRAL RESECTION OF BLADDER TUMOR N/A 11/20/2020   Procedure: TRANSURETHRAL RESECTION OF BLADDER TUMOR (TURBT) WITH INSTILLATION OF post-op GEMCITABINE;  Surgeon: Janith Lima, MD;  Location[facility];  Service: Urology;  Laterality: N/A;   TRANSURETHRAL RESECTION OF BLADDER TUMOR WITH MITOMYCIN-C N/A 09/23/2019   Procedure: TRANSURETHRAL RESECTION OF BLADDER TUMOR WITH GEMCITABINE INSTILLATION INTO THE BLADDER;  Surgeon: Lucas Mallow, MD;  Location: [facility];  Service: Urology;  Laterality: N/A;   TRANSURETHRAL RESECTION OF BLADDER TUMOR WITH MITOMYCIN-C N/A 06/24/2020   Procedure: TRANSURETHRAL RESECTION OF BLADDER TUMOR WITH POST OPERATIVE GEMCITABINE INSTILLATION;  Surgeon: Lucas Mallow, MD;  Location: [place];  Service: Urology;  Laterality: N/A;    Allergies  No Known Allergies  History of Present Illness    Jeremy Day is a 70 y.o. male with a hx of aortic root dilation, ventricular bigeminy last seen  10/16/19 by Dr. Marlou Porch.  He previously was the [employer] of the [employer]. Previously evaluated in the ED a number of years ago with sensation of abdominal fullness and dizziness. He was diagnosed with frequent PVC's in trigeminy, bigeminy pattern. Monitor was placed showing few PVC's which were asymptomatic.   He was seen 10/2019 by Dr. Marlou Porch doing well from a cardiac perspective. Seen 05/2021 for preop clearance for back surgery doing well from a cardiac perspective.   He was admitted 08/2021 for spinal stenosis surgery and discharged to inpatient rehab. Notation  of bigeminy during admission.   He presents today for follow up. Reports recovering well from spinal surgery and mobility improving. Still requiring high dose gabapentin 800mg  TID with some fatigue. Notes some improvement over the last month. Reports no shortness of breath nor dyspnea on exertion. Reports no chest pain, pressure, or tightness. No edema, orthopnea, PND. Reports rare palpitations which is not bothersome. No lightheadedness, dizziness, near syncope, syncope.     EKGs/Labs/Other Studies Reviewed:   The following studies were reviewed today:  Echo 10/29/19 1. Left ventricular ejection fraction, by visual estimation, is 60 to  65%. The left ventricle has normal function. There is mildly increased  left ventricular hypertrophy.   2. Left ventricular diastolic parameters are consistent with Grade I  diastolic dysfunction (impaired relaxation).   3. The left ventricle has no regional wall motion abnormalities.   4. Global right ventricle has normal systolic function.The right  ventricular size is mildly enlarged. No increase in right ventricular wall  thickness.   5. Left atrial size was normal.   6. Right atrial size was normal.   7. Mild chordal systolic anterior motion of the mitral valve.   8. The mitral valve is normal in structure. Mild mitral valve  regurgitation. No evidence of mitral stenosis.   9. The tricuspid valve is normal in structure. Tricuspid valve  regurgitation is trivial.  10. The aortic valve is normal in structure. Aortic valve regurgitation is  trivial. No evidence of aortic valve sclerosis or stenosis.  11. The pulmonic valve was normal in structure. Pulmonic valve  regurgitation is trivial.  12. Aneurysm of the ascending aorta, measuring 41 mm.  13. There is moderate dilatation at the level of the sinuses of Valsalva  measuring 44 mm.  14. The inferior vena cava is normal in size with <50% respiratory  variability, suggesting right atrial pressure of  8 mmHg.   EKG:  EKG is  ordered today.  The ekg ordered today demonstrates NSR 60 bpm with occasional PVC. No acute ST/T wave changes.  Recent Labs: 11/20/2020: BUN 10; Creatinine, Ser 0.80; Hemoglobin 17.0; Potassium 3.9; Sodium 139  Recent Lipid Panel No results found for: CHOL, TRIG, HDL, CHOLHDL, VLDL, LDLCALC, LDLDIRECT Home Medications   Current Meds  Medication Sig   acetaminophen (TYLENOL) 500 MG tablet Take 1,000 mg by mouth every 6 (six) hours as needed (pain.).   calcium carbonate (TUMS - DOSED IN MG ELEMENTAL CALCIUM) 500 MG chewable tablet Chew 1 tablet by mouth daily as needed for indigestion or heartburn.   clobetasol cream (TEMOVATE) 5.85 % Apply 1 application topically 2 (two) times daily as needed (psoriasis).    diphenhydrAMINE (BENADRYL) 25 MG tablet Take 25 mg by mouth every 6 (six) hours as needed for allergies.   famotidine (PEPCID) 20 MG tablet Take 10 mg by mouth every morning.   gabapentin (NEURONTIN) 800 MG tablet Take 800 mg by mouth 3 (three) times daily.  mometasone (NASONEX) 50 MCG/ACT nasal spray Place 2 sprays into the nose daily as needed (allergies.).    pimecrolimus (ELIDEL) 1 % cream Apply 1 application topically daily as needed (psoriasis).    Polyethyl Glycol-Propyl Glycol (SYSTANE OP) Place 1 drop into both eyes 2 (two) times daily as needed (dry eyes).     Review of Systems      All other systems reviewed and are otherwise negative except as noted above.  Physical Exam    VS:  BP 110/62   Pulse 80   Ht 6' (1.829 m)   Wt 177 lb 9.6 oz (80.6 kg)   BMI 24.09 kg/m  , BMI Body mass index is 24.09 kg/m.  Wt Readings from Last 3 Encounters:  10/07/21 177 lb 9.6 oz (80.6 kg)  06/04/21 192 lb 9.6 oz (87.4 kg)  11/20/20 191 lb 9.6 oz (86.9 kg)    GEN: Well nourished, well developed, in no acute distress. HEENT: normal. Neck: Supple, no JVD, carotid bruits, or masses. Cardiac: RRR, no murmurs, rubs, or gallops. No clubbing, cyanosis, edema.   Radials/PT 2+ and equal bilaterally.  Respiratory:  Respirations regular and unlabored, clear to auscultation bilaterally. GI: Soft, nontender, nondistended. MS: No deformity or atrophy. Skin: Warm and dry, no rash. Neuro:  Strength and sensation are intact. Psych: Normal affect.  Assessment & Plan    Dilated aortic root - Echo 10/2019 with LVEF 60-65%, mild LVH, gr1DD, mild MR, aneurysm of the ascending aorta measuring 43mm, moderate dilation at level of sinuses of valsalva measuring 52mm. Plan to repeat echo 10/2021 for monitoring which has been scheduled for later this month. Continue optimal blood pressure control, not presently requiring antihypertensive medication.   GERD - Well controlled on Pepcid 10mg  PRN.   PVC - Asymptomatic with no palpitations. No indication for AV nodal blocking therapy. If symptoms noted in the future could consider low dose Metoprolol.  Disposition: Follow up in 1 year(s) with Dr. Marlou Porch or APP.  Signed, Loel Dubonnet, NP 10/07/2021, 11:35 AM [place]

## 2021-10-07 ENCOUNTER — Other Ambulatory Visit: Payer: Self-pay

## 2021-10-07 ENCOUNTER — Ambulatory Visit (HOSPITAL_BASED_OUTPATIENT_CLINIC_OR_DEPARTMENT_OTHER): Payer: PPO | Admitting: Family

## 2021-10-07 ENCOUNTER — Telehealth (HOSPITAL_BASED_OUTPATIENT_CLINIC_OR_DEPARTMENT_OTHER): Payer: Self-pay | Admitting: Family

## 2021-10-07 ENCOUNTER — Encounter (HOSPITAL_BASED_OUTPATIENT_CLINIC_OR_DEPARTMENT_OTHER): Payer: Self-pay | Admitting: Family

## 2021-10-07 VITALS — BP 110/62 | HR 80 | Ht 72.0 in | Wt 177.6 lb

## 2021-10-07 DIAGNOSIS — I77810 Thoracic aortic ectasia: Secondary | ICD-10-CM | POA: Diagnosis not present

## 2021-10-07 DIAGNOSIS — I493 Ventricular premature depolarization: Secondary | ICD-10-CM | POA: Diagnosis not present

## 2021-10-07 NOTE — Patient Instructions (Signed)
Medication Instructions:  No medication changes today.   *If you need a refill on your cardiac medications before your next appointment, please call your pharmacy*   Lab Work: None ordered today.   Testing/Procedures: Your echocardiogram is scheduled for 11/04/21.    Follow-Up: At [facility], you and your health needs are our priority.  As part of our continuing mission to provide you with exceptional heart care, we have created designated Provider Care Teams.  These Care Teams include your primary Cardiologist (physician) and Advanced Practice Providers (APPs -  Physician Assistants and Nurse Practitioners) who all work together to provide you with the care you need, when you need it.  We recommend signing up for the patient portal called "MyChart".  Sign up information is provided on this After Visit Summary.  MyChart is used to connect with patients for Virtual Visits (Telemedicine).  Patients are able to view lab/test results, encounter notes, upcoming appointments, etc.  Non-urgent messages can be sent to your provider as well.   To learn more about what you can do with MyChart, go to [email or web address].    Your next appointment:   1 year(s)  The format for your next appointment:   In Person  Provider:   Candee Furbish, MD     Other Instructions  Heart Healthy Diet Recommendations: A low-salt diet is recommended. Meats should be grilled, baked, or boiled. Avoid fried foods. Focus on lean protein sources like fish or chicken with vegetables and fruits. The American Heart Association is a Microbiologist!  American Heart Association Diet and Lifeystyle Recommendations    Exercise recommendations: The American Heart Association recommends 150 minutes of moderate intensity exercise weekly. Try 30 minutes of moderate intensity exercise 4-5 times per week. This could include walking, jogging, or swimming.

## 2021-10-07 NOTE — Telephone Encounter (Signed)
Patient seen 05/2021 recommended for 1 year follow up. Appears appt today scheduled based on old recall.  Called and left VM per DPR. If wishes to proceed with appointment today, perfectly fine. If feeling well, can defer appt until 05/2022. Should proceed with echo scheduled for later this month as scheduled for monitoring of aortic root.  Loel Dubonnet, NP

## 2021-10-08 ENCOUNTER — Ambulatory Visit: Payer: PPO | Attending: Physical Medicine and Rehabilitation

## 2021-10-08 ENCOUNTER — Other Ambulatory Visit: Payer: Self-pay

## 2021-10-08 DIAGNOSIS — R293 Abnormal posture: Secondary | ICD-10-CM | POA: Diagnosis not present

## 2021-10-08 DIAGNOSIS — M79605 Pain in left leg: Secondary | ICD-10-CM | POA: Insufficient documentation

## 2021-10-08 DIAGNOSIS — M79604 Pain in right leg: Secondary | ICD-10-CM | POA: Diagnosis not present

## 2021-10-08 NOTE — Therapy (Signed)
[facility] [place], [place], [address] Phone: [phone]   Fax:  [phone]  Physical Therapy Treatment  Patient Details  Name: Jeremy Day MRN: [id number] Date of Birth: Nov 25, 1950 Referring Provider (PT): Ina Homes MD   Encounter Date: 10/08/2021   PT End of Session - 10/08/21 0958     Visit Number 8    Number of Visits 16    Date for PT Re-Evaluation 12/07/21    Authorization Type FOTO AT LEAST EVERY 5TH VISIT.  PROGRESS NOTE AT 10TH VISIT.  KX MODIFIER AFTER 15 VISITS.    PT Start Time 0945    PT Stop Time 5916    PT Time Calculation (min) 47 min    Equipment Utilized During Treatment Other (comment)   unilateral SPC   Activity Tolerance Patient tolerated treatment well    Behavior During Therapy WFL for tasks assessed/performed             Past Medical History:  Diagnosis Date   Abnormal tandem walk    using 2 canes due to lower back arthritis   Ascending aorta dilatation (HCC)    echo  04-11-2016  68m and dilated aortc root 368m  Bladder tumor    BPH (benign prostatic hyperplasia)    Cancer ([facility]   bladder   Depression    Diverticulosis    Full dentures    GERD (gastroesophageal reflux disease)    Hiatal hernia    History of colon polyps    History of staphylococcal infection 2008   right ankle  w/ sepsis   Meningitis spinal age 1 59r 4   OA (osteoarthritis)    lower back, right ankle,, bilateral knees and shouldres   Pneumonia yrs ago   Pre-diabetes    Psoriasis    PVC's (premature ventricular contractions)    2012-- hx bigeminy/ trigeminy with near syncope (cardiology consult note in epic dated 2012)  (last cardiology visit in epic w/ dr skMarlou Porch05-21-2017)   Seasonal allergies    Urgency of urination     Past Surgical History:  Procedure Laterality Date   ANKLE ARTHROSCOPY Right 02-13-2007    dr grBerenice Primas w/ debridement and removal loose body   [place]   INGUINAL LYMPH NODE BIOPSY  2001  approx.   benign   TRANSURETHRAL RESECTION OF BLADDER TUMOR N/A 02/06/2019   Procedure: TRANSURETHRAL RESECTION OF BLADDER TUMOR (TURBT);  Surgeon: BeLucas MallowMD;  Location: [facility];  Service: Urology;  Laterality: N/A;   TRANSURETHRAL RESECTION OF BLADDER TUMOR N/A 02/20/2019   Procedure: TRANSURETHRAL RESECTION OF BLADDER TUMOR (TURBT);  Surgeon: BeLucas MallowMD;  Location: [facility];  Service: Urology;  Laterality: N/A;   TRANSURETHRAL RESECTION OF BLADDER TUMOR N/A 03/13/2019   Procedure: TRANSURETHRAL RESECTION OF BLADDER TUMOR (TURBT);  Surgeon: BeLucas MallowMD;  Location: [facility];  Service: Urology;  Laterality: N/A;   TRANSURETHRAL RESECTION OF BLADDER TUMOR N/A 11/20/2020   Procedure: TRANSURETHRAL RESECTION OF BLADDER TUMOR (TURBT) WITH INSTILLATION OF post-op GEMCITABINE;  Surgeon: GaJanith LimaMD;  [facility]: Urology;  Laterality: N/A;   TRANSURETHRAL RESECTION OF BLADDER TUMOR WITH MITOMYCIN-C N/A 09/23/2019   Procedure: TRANSURETHRAL RESECTION OF BLADDER TUMOR WITH GEMCITABINE INSTILLATION INTO THE BLADDER;  Surgeon: BeLucas MallowMD;  [facility]:  U[facility];  Laterality: N/A;   TRANSURETHRAL RESECTION OF BLADDER TUMOR WITH MITOMYCIN-C N/A 06/24/2020   Procedure: TRANSURETHRAL RESECTION OF BLADDER TUMOR WITH POST OPERATIVE GEMCITABINE INSTILLATION;  Surgeon: [name], MD;  Location: [facility];  Service: Urology;  Laterality: N/A;    There were no vitals filed for this visit.   Subjective Assessment - 10/08/21 0946     Subjective COVID-19 screen performed prior to patient entering clinic. Patient reports minimal pain and soreness today in his back and hips.    Pertinent History Bladder cancer, right ankle surgery, OA, hernia repair (right).    Limitations Standing    How long  can you sit comfortably? Up to an hour.    How long can you stand comfortably? Currently around 2 minutes per patient report.    How long can you walk comfortably? Been walking around home with FWW.    Patient Stated Goals Become as independent as possible and walk without assisitve device.    Currently in Pain? Yes    Pain Score 2     Pain Location Back    Pain Orientation Posterior;Lower    Pain Descriptors / Indicators Sore    Pain Type Surgical pain    Pain Onset More than a month ago                               [facility] Adult PT Treatment/Exercise - 10/08/21 0001       Lumbar Exercises: Aerobic   Nustep L6 x 15 minutes      Lumbar Exercises: Machines for Strengthening   Cybex Knee Extension 30# 2x10 reps    Cybex Knee Flexion 30# 2x10 reps      Lumbar Exercises: Standing   Row Strengthening;Both;20 reps    Row Limitations Blue XTS    Shoulder Extension Strengthening;Both;20 reps;Limitations    Shoulder Extension Limitations Blue XTS                 Balance Exercises - 10/08/21 0001       Balance Exercises: Standing   Standing Eyes Opened Head turns;Narrow base of support (BOS);Solid surface;2 reps   1 minute each (cervical flexion/extension and rotation   Tandem Stance Eyes open;Intermittent upper extremity support;4 reps;30 secs    Marching Solid surface;Upper extremity assist 2;Static;20 reps   red t-band around feet                 PT Short Term Goals - 09/08/21 1235       PT SHORT TERM GOAL #1   Title Independent with an initial HEP.    Time 2    Period Weeks    Status New               PT Long Term Goals - 10/04/21 1439       PT LONG TERM GOAL #1   Title Independent with an advanced HEP.    Time 8    Period Weeks    Status On-going      PT LONG TERM GOAL #2   Title Patient able to stand 30 minutes.    Time 8    Period Weeks    Status Partially Met      PT LONG TERM GOAL #3   Title Perform ADL's with  pain not > 3/10.    Time 8    Period Weeks    Status Achieved  PT LONG TERM GOAL #4   Title Walk 500 feet with a straight cane include over uneven terrain (ie: lawn) and up/dowm 4 steps in a reciprocating fashion with railing.    Time 8    Period Weeks    Status On-going                   Plan - 10/08/21 0956     Clinical Impression Statement Patient was progressed with multiple new interventions for lumbar and lower extremity stability. He required minimal cuing with resisted marching to prevent lumbar mobility to facilitate lumbar muscular stability. He able to exhibit minimal difficulty with most of tandem stance today. However, he experienced one significant increase in usteadiness when he attempted to quickly turn his head to the right which cuased a loss of balance to the right. He was able to self correct this with upper extremity support from the paralell bars. He reported feeling "tired for the first time since surgery" upon the conclusion of treatment. He continues to require skilled physical therapy to address his remaining impairments to return to his prior level of function.    Personal Factors and Comorbidities Comorbidity 1    Comorbidities Bladder cancer, right ankle surgery, OA, hernia repair (right).    Examination-Activity Limitations Other;Locomotion Level;Stand    Examination-Participation Restrictions Other    Stability/Clinical Decision Making Stable/Uncomplicated    Rehab Potential Excellent    PT Frequency 2x / week    PT Duration 8 weeks    PT Treatment/Interventions ADLs/Self Care Home Management;Cryotherapy;Moist Heat;Therapeutic activities;Therapeutic exercise;Manual techniques;Patient/family education;Passive range of motion;Gait training;Stair training;Functional mobility training;Neuromuscular re-education    PT Next Visit Plan Continue progressive strengthening.    Consulted and Agree with Plan of Care Patient             Patient will  benefit from skilled therapeutic intervention in order to improve the following deficits and impairments:  Abnormal gait, Decreased activity tolerance, Postural dysfunction, Pain  Visit Diagnosis: Abnormal posture  Pain in left leg  Pain in right leg     Problem List Patient Active Problem List   Diagnosis Date Noted   Bladder tumor 02/06/2019   Arthritis of right ankle 07/15/2015   Septic arthritis of right ankle (HCC) 07/15/2015   MSSA (methicillin susceptible Staphylococcus aureus) infection 07/15/2015   Ankle fracture, right 07/15/2015   Traumatic osteoarthritis of right ankle 07/15/2015   Dysphagia, unspecified(787.20) 04/30/2014   Heartburn 04/30/2014   Shoulder impingement syndrome    GERD (gastroesophageal reflux disease)     Darlin Coco, PT 10/08/2021, 12:16 PM  [facility] [address] [place], [place], [address] Phone: [phone]   Fax:  [phone]  Name: Jeremy Day MRN: [id number] Date of Birth: Dec 07, 1950

## 2021-10-11 ENCOUNTER — Ambulatory Visit: Payer: PPO | Admitting: Physical Therapy

## 2021-10-11 ENCOUNTER — Other Ambulatory Visit: Payer: Self-pay

## 2021-10-11 ENCOUNTER — Encounter: Payer: Self-pay | Admitting: Physical Therapy

## 2021-10-11 DIAGNOSIS — M79605 Pain in left leg: Secondary | ICD-10-CM

## 2021-10-11 DIAGNOSIS — R293 Abnormal posture: Secondary | ICD-10-CM

## 2021-10-11 DIAGNOSIS — M79604 Pain in right leg: Secondary | ICD-10-CM

## 2021-10-11 NOTE — Therapy (Signed)
[facility] [place], [place], [address] Phone: [phone]   Fax:  [phone]  Physical Therapy Treatment  Patient Details  Name: Jeremy Day MRN: [id number] Date of Birth: October 09, 1951 Referring Provider (PT): Ina Homes MD   Encounter Date: 10/11/2021   PT End of Session - 10/11/21 0839     Visit Number 9    Number of Visits 16    Date for PT Re-Evaluation 12/07/21    Authorization Type FOTO AT LEAST EVERY 5TH VISIT.  PROGRESS NOTE AT 10TH VISIT.  KX MODIFIER AFTER 15 VISITS.    PT Start Time [id number]    PT Stop Time 0900    PT Time Calculation (min) 43 min    Equipment Utilized During Treatment Other (comment)   Eagar   Activity Tolerance Patient tolerated treatment well    Behavior During Therapy WFL for tasks assessed/performed             Past Medical History:  Diagnosis Date   Abnormal tandem walk    using 2 canes due to lower back arthritis   Ascending aorta dilatation (HCC)    echo  04-11-2016  53m and dilated aortc root 353m  Bladder tumor    BPH (benign prostatic hyperplasia)    [facility] [place]   bladder   Depression    Diverticulosis    Full dentures    GERD (gastroesophageal reflux disease)    Hiatal hernia    History of colon polyps    History of staphylococcal infection 2008   right ankle  w/ sepsis   Meningitis spinal age 29 21r 4   OA (osteoarthritis)    lower back, right ankle,, bilateral knees and shouldres   Pneumonia yrs ago   Pre-diabetes    Psoriasis    PVC's (premature ventricular contractions)    2012-- hx bigeminy/ trigeminy with near syncope (cardiology consult note in epic dated 2012)  (last cardiology visit in epic w/ dr skMarlou Porch05-21-2017)   Seasonal allergies    Urgency of urination     Past Surgical History:  Procedure Laterality Date   ANKLE ARTHROSCOPY Right 02-13-2007    dr grBerenice Primas w/ debridement and removal loose body   [place]ONOS[place]  [place] one [place]  2019   [place]   INGUINAL LYMPH NODE BIOPSY  2001  approx.   benign   TRANSURETHRAL RESECTION OF BLADDER TUMOR N/A 02/06/2019   Procedure: TRANSURETHRAL RESECTION OF BLADDER TUMOR (TURBT);  Surgeon: BeLucas MallowMD;  [facility];  Service: Urology;  Laterality: N/A;   TRANSURETHRAL RESECTION OF BLADDER TUMOR N/A 02/20/2019   Procedure: TRANSURETHRAL RESECTION OF BLADDER TUMOR (TURBT);  Surgeon: BeLucas MallowMD;  [facility];  Service: Urology;  Laterality: N/A;   TRANSURETHRAL RESECTION OF BLADDER TUMOR N/A 03/13/2019   Procedure: TRANSURETHRAL RESECTION OF BLADDER TUMOR (TURBT);  Surgeon: BeLucas MallowMD;  [facility];  Service: Urology;  Laterality: N/A;   TRANSURETHRAL RESECTION OF BLADDER TUMOR N/A 11/20/2020   Procedure: TRANSURETHRAL RESECTION OF BLADDER TUMOR (TURBT) WITH INSTILLATION OF post-op GEMCITABINE;  Surgeon: GaJanith LimaMD;  [facility];  Laterality: N/A;   TRANSURETHRAL RESECTION OF BLADDER TUMOR WITH MITOMYCIN-C N/A 09/23/2019   Procedure: TRANSURETHRAL RESECTION OF BLADDER TUMOR WITH GEMCITABINE INSTILLATION INTO THE BLADDER;  Surgeon: BeLucas MallowMD;  [facility];  Laterality: N/A;   TRANSURETHRAL RESECTION OF BLADDER TUMOR WITH MITOMYCIN-C N/A 06/24/2020   Procedure: TRANSURETHRAL RESECTION OF BLADDER TUMOR WITH POST OPERATIVE GEMCITABINE INSTILLATION;  Surgeon: Lucas Mallow, MD;  Location: [facility];  Service: Urology;  Laterality: N/A;    There were no vitals filed for this visit.   Subjective Assessment - 10/11/21 0835     Subjective COVID-19 screen performed prior to patient entering clinic. No new complaints.    Pertinent History Bladder cancer, right ankle surgery, OA, hernia repair (right).    Limitations Standing    How long can you sit comfortably? Up to an hour.    How long can you  stand comfortably? Currently around 2 minutes per patient report.    How long can you walk comfortably? Been walking around home with FWW.    Patient Stated Goals Become as independent as possible and walk without assisitve device.    Currently in Pain? 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  Patient well educated in "BLT" (no bending, arching or twisting).                           [facility] Adult PT Treatment/Exercise - 10/11/21 0001       Lumbar Exercises: Stretches   Hip Flexor Stretch Right;Left;Limitations    Hip Flexor Stretch Limitations x10 reps standing in lunge      Lumbar Exercises: Aerobic   Nustep L6-7 x15 min      Lumbar Exercises: Machines for Strengthening   Cybex Knee Extension 10# 3x10 reps    Cybex Knee Flexion 30# 3x10 reps      Lumbar Exercises: Standing   Shoulder Extension Strengthening;Both;20 reps;Limitations    Shoulder Extension Limitations BLue XTS    Other Standing Lumbar Exercises Chop/lift blue XTS x20 reps    Other Standing Lumbar Exercises Core punches blue XTS x20 reps      Knee/Hip Exercises: Standing   Hip Extension Stengthening;Both;20 reps;Knee straight;Limitations    Extension Limitations red theraband    Other Standing Knee Exercises sidestepping red theraband x5 reps                       PT Short Term Goals - 09/08/21 1235       PT SHORT TERM GOAL #1   Title Independent with an initial HEP.    Time  2    Period Weeks    Status New               PT Long Term Goals - 10/04/21 1439       PT LONG TERM GOAL #1   Title Independent with an advanced HEP.    Time 8  Period Weeks    Status On-going      PT LONG TERM GOAL #2   Title Patient able to stand 30 minutes.    Time 8    Period Weeks    Status Partially Met      PT LONG TERM GOAL #3   Title Perform ADL's with pain not > 3/10.    Time 8    Period Weeks    Status Achieved      PT LONG TERM GOAL #4   Title Walk 500 feet with a straight cane include over uneven terrain (ie: lawn) and up/dowm 4 steps in a reciprocating fashion with railing.    Time 8    Period Weeks    Status On-going                   Plan - 10/11/21 1023     Clinical Impression Statement Patient presented in clinic with reports of hip tightness still. Patient progressed to more standing core training along with progression of hip abductor strengthening. Patient finally able to sleep in SL as that is his preferred position.    Personal Factors and Comorbidities Comorbidity 1    Comorbidities Bladder cancer, right ankle surgery, OA, hernia repair (right).    Examination-Activity Limitations Other;Locomotion Level;Stand    Examination-Participation Restrictions Other    Stability/Clinical Decision Making Stable/Uncomplicated    Rehab Potential Excellent    PT Frequency 2x / week    PT Duration 8 weeks    PT Treatment/Interventions ADLs/Self Care Home Management;Cryotherapy;Moist Heat;Therapeutic activities;Therapeutic exercise;Manual techniques;Patient/family education;Passive range of motion;Gait training;Stair training;Functional mobility training;Neuromuscular re-education    PT Next Visit Plan Continue progressive strengthening.    Consulted and Agree with Plan of Care Patient             Patient will benefit from skilled therapeutic intervention in order to improve the following deficits and impairments:  Abnormal gait,  Decreased activity tolerance, Postural dysfunction, Pain  Visit Diagnosis: Abnormal posture  Pain in left leg  Pain in right leg     Problem List Patient Active Problem List   Diagnosis Date Noted   Bladder tumor 02/06/2019   Arthritis of right ankle 07/15/2015   Septic arthritis of right ankle (HCC) 07/15/2015   MSSA (methicillin susceptible Staphylococcus aureus) infection 07/15/2015   Ankle fracture, right 07/15/2015   Traumatic osteoarthritis of right ankle 07/15/2015   Dysphagia, unspecified(787.20) 04/30/2014   Heartburn 04/30/2014   Shoulder impingement syndrome    GERD (gastroesophageal reflux disease)     Standley Brooking, PTA 10/11/2021, 10:25 AM  [facility] [address] [place], [place], [address] Phone: [phone]   Fax:  [phone]  Name: Jeremy Day MRN: [id number] Date of Birth: Dec 31, 1950

## 2021-10-14 DIAGNOSIS — R310 Gross hematuria: Secondary | ICD-10-CM | POA: Diagnosis not present

## 2021-10-14 DIAGNOSIS — C678 Malignant neoplasm of overlapping sites of bladder: Secondary | ICD-10-CM | POA: Diagnosis not present

## 2021-10-15 ENCOUNTER — Other Ambulatory Visit: Payer: Self-pay

## 2021-10-15 ENCOUNTER — Ambulatory Visit: Payer: PPO

## 2021-10-15 DIAGNOSIS — R293 Abnormal posture: Secondary | ICD-10-CM

## 2021-10-15 DIAGNOSIS — M79605 Pain in left leg: Secondary | ICD-10-CM

## 2021-10-15 DIAGNOSIS — M79604 Pain in right leg: Secondary | ICD-10-CM

## 2021-10-15 NOTE — Therapy (Addendum)
[facility] [place], [place], [address] Phone: [phone]   Fax:  [phone]  Physical Therapy Treatment  Patient Details  Name: Jeremy Day MRN: [id number] Date of Birth: Jun 27, 1951 Referring Provider (PT): Ina Homes MD   Encounter Date: 10/15/2021   PT End of Session - 10/15/21 0948     Visit Number 10    Number of Visits 16    Date for PT Re-Evaluation 12/07/21    Authorization Type FOTO AT LEAST EVERY 5TH VISIT.  PROGRESS NOTE AT 10TH VISIT.  KX MODIFIER AFTER 15 VISITS.    PT Start Time 0945    PT Stop Time 1031    PT Time Calculation (min) 46 min    Equipment Utilized During Treatment Other (comment)   [place]   Activity Tolerance Patient tolerated treatment well    Behavior During Therapy WFL for tasks assessed/performed             Past Medical History:  Diagnosis Date   Abnormal tandem walk    using 2 canes due to lower back arthritis   Ascending aorta dilatation (HCC)    echo  04-11-2016  69m and dilated aortc root 346m  Bladder tumor    BPH (benign prostatic hyperplasia)    [place]   Depression    Diverticulosis    Full dentures    GERD (gastroesophageal reflux disease)    Hiatal hernia    History of colon polyps    History of staphylococcal infection 2008   right ankle  w/ sepsis   Meningitis spinal age 89 68r 6   OA (osteoarthritis)    lower back, right ankle,, bilateral knees and shouldres   Pneumonia yrs ago   Pre-diabetes    Psoriasis    PVC's (premature ventricular contractions)    2012-- hx bigeminy/ trigeminy with near syncope (cardiology consult note in epic dated 2012)  (last cardiology visit in epic w/ dr skMarlou Porch05-21-2017)   Seasonal allergies    Urgency of urination     Past Surgical History:  Procedure Laterality Date   ANKLE ARTHROSCOPY Right 02-13-2007    dr grBerenice Primas w/ debridement and removal loose body   COLONOSCOPY  last one summer  2019   [place] 1980[place]   INGUINAL LYMPH NODE BIOPSY  2001  approx.   benign   TRANSURETHRAL RESECTION OF BLADDER TUMOR N/A 02/06/2019   Procedure: TRANSURETHRAL RESECTION OF BLADDER TUMOR (TURBT);  Surgeon: BeLucas MallowMD;  Location: [facility];  Service: Urology;  Laterality: N/A;   TRANSURETHRAL RESECTION OF BLADDER TUMOR N/A 02/20/2019   Procedure: TRANSURETHRAL RESECTION OF BLADDER TUMOR (TURBT);  Surgeon: BeLucas MallowMD;  Location: [facility];  Service: Urology;  Laterality: N/A;   TRANSURETHRAL RESECTION OF BLADDER TUMOR N/A 03/13/2019   Procedure: TRANSURETHRAL RESECTION OF BLADDER TUMOR (TURBT);  Surgeon: BeLucas MallowMD;  Location: [facility];  Service: Urology;  Laterality: N/A;   TRANSURETHRAL RESECTION OF BLADDER TUMOR N/A 11/20/2020   Procedure: TRANSURETHRAL RESECTION OF BLADDER TUMOR (TURBT) WITH INSTILLATION OF post-op GEMCITABINE;  Surgeon: GaJanith LimaMD;  [facility];  Laterality: N/A;   TRANSURETHRAL RESECTION OF BLADDER TUMOR WITH MITOMYCIN-C N/A 09/23/2019   Procedure: TRANSURETHRAL RESECTION OF BLADDER TUMOR WITH GEMCITABINE INSTILLATION INTO THE BLADDER;  Surgeon: BeLucas MallowMD;  [facility]: [facility];  Laterality: N/A;   TRANSURETHRAL RESECTION OF BLADDER TUMOR WITH MITOMYCIN-C N/A 06/24/2020   Procedure: TRANSURETHRAL RESECTION OF BLADDER TUMOR WITH POST OPERATIVE GEMCITABINE INSTILLATION;  Surgeon: Lucas Mallow, MD;  Location: [facility];  Service: Urology;  Laterality: N/A;    There were no vitals filed for this visit.   Subjective Assessment - 10/15/21 0947     Subjective COVID-19 screen performed prior to patient entering clinic. Patient reports that he is having minimal low back pain today.    Pertinent History Bladder cancer, right ankle surgery, OA, hernia repair (right).    Limitations Standing    How long can you sit  comfortably? Up to an hour.    How long can you stand comfortably? Currently around 2 minutes per patient report.    How long can you walk comfortably? Been walking around home with FWW.    Patient Stated Goals Become as independent as possible and walk without assisitve device.    Currently in Pain? Yes    Pain Score 1     Pain Location Back    Pain Orientation Lower    Pain Onset More than a month ago                               [facility] Adult PT Treatment/Exercise - 10/15/21 0001       Lumbar Exercises: Aerobic   Nustep L6-7 x15 min      Lumbar Exercises: Machines for Strengthening   Cybex Knee Flexion 30# 3x10 reps      Lumbar Exercises: Standing   Shoulder Extension Strengthening;Both;20 reps;Limitations    Shoulder Extension Limitations Blue XTS    Other Standing Lumbar Exercises Chop/lift blue XTS x20 reps      Lumbar Exercises: Seated   Sit to Stand 20 reps   without UE support     Knee/Hip Exercises: Standing   Hip Extension Stengthening;Both;20 reps;Knee straight;Limitations    Lateral Step Up Both;Hand Hold: 2;Step Height: 6"   2 minutes                Balance Exercises - 10/15/21 0001       Balance Exercises: Standing   Marching Foam/compliant surface;Upper extremity assist 2;Static   2 minutes; on BOSU (ball up)                 PT Short Term Goals - 10/15/21 0949       PT SHORT TERM GOAL #1   Title Independent with an initial HEP.    Time 2    Period Weeks    Status Achieved               PT Long Term Goals - 10/15/21 0950       PT LONG TERM GOAL #1   Title Independent with an advanced HEP.    Time 8    Period Weeks    Status On-going      PT LONG TERM GOAL #2   Title Patient able to stand 30 minutes.    Baseline 30 minutes    Time 8    Period Weeks    Status Achieved      PT LONG TERM GOAL #3   Title Perform ADL's with pain not > 3/10.    Time 8    Period Weeks    Status Achieved      PT  LONG TERM GOAL #4  Title Walk 500 feet with a straight cane include over uneven terrain (ie: lawn) and up/dowm 4 steps in a reciprocating fashion with railing.    Baseline step to pattern with stairs; utilizes 2 poles to walk outside    Time 8    Period Weeks    Status On-going                   Plan - 10/15/21 0948     Clinical Impression Statement Patient was progressed with lateral step ups in addition to familiar interventions for improved lumbar and lower extremity strength with moderate difficulty and fatigue. He required minimal cuing with lateral step ups to prevent hip external rotation. He reported no pain or discomfort with any of today's interventions. He reported feeling tired upon the conclusion of treatment. He continues to require skilled physical therapy to address his remaining impairments to return to his prior level of function.    Personal Factors and Comorbidities Comorbidity 1    Comorbidities Bladder cancer, right ankle surgery, OA, hernia repair (right).    Examination-Activity Limitations Other;Locomotion Level;Stand    Examination-Participation Restrictions Other    Stability/Clinical Decision Making Stable/Uncomplicated    Rehab Potential Excellent    PT Frequency 2x / week    PT Duration 8 weeks    PT Treatment/Interventions ADLs/Self Care Home Management;Cryotherapy;Moist Heat;Therapeutic activities;Therapeutic exercise;Manual techniques;Patient/family education;Passive range of motion;Gait training;Stair training;Functional mobility training;Neuromuscular re-education    PT Next Visit Plan Continue progressive strengthening.    Consulted and Agree with Plan of Care Patient             Patient will benefit from skilled therapeutic intervention in order to improve the following deficits and impairments:  Abnormal gait, Decreased activity tolerance, Postural dysfunction, Pain  Visit Diagnosis: Abnormal posture  Pain in left leg  Pain in right  leg     Problem List Patient Active Problem List   Diagnosis Date Noted   Bladder tumor 02/06/2019   Arthritis of right ankle 07/15/2015   Septic arthritis of right ankle (HCC) 07/15/2015   MSSA (methicillin susceptible Staphylococcus aureus) infection 07/15/2015   Ankle fracture, right 07/15/2015   Traumatic osteoarthritis of right ankle 07/15/2015   Dysphagia, unspecified(787.20) 04/30/2014   Heartburn 04/30/2014   Shoulder impingement syndrome    GERD (gastroesophageal reflux disease)     Darlin Coco, PT 10/15/2021, 11:55 AM  [facility] [place], [place], [address] Phone: [phone]   Fax:  [phone]  Name: Jeremy Day MRN: [id number] Date of Birth: Nov 11, 1950  Progress Note Reporting Period 09/08/21 to 10/15/21.  See note below for Objective Data and Assessment of Progress/Goals.    Patient is making good progress with skilled physical therapy as he has met most of his goal for therapy. However, navigating stairs and uneven terrain continues to be difficult. Recommend that he continue with his plan of care to address his remaining impairments to return to his prior level of function.

## 2021-10-22 DIAGNOSIS — I493 Ventricular premature depolarization: Secondary | ICD-10-CM | POA: Diagnosis not present

## 2021-10-22 DIAGNOSIS — M4316 Spondylolisthesis, lumbar region: Secondary | ICD-10-CM | POA: Diagnosis not present

## 2021-10-22 DIAGNOSIS — K4090 Unilateral inguinal hernia, without obstruction or gangrene, not specified as recurrent: Secondary | ICD-10-CM | POA: Diagnosis not present

## 2021-11-04 ENCOUNTER — Ambulatory Visit (HOSPITAL_COMMUNITY): Payer: PPO | Attending: Family

## 2021-11-04 ENCOUNTER — Other Ambulatory Visit: Payer: Self-pay

## 2021-11-04 DIAGNOSIS — I351 Nonrheumatic aortic (valve) insufficiency: Secondary | ICD-10-CM | POA: Diagnosis not present

## 2021-11-04 DIAGNOSIS — I77810 Thoracic aortic ectasia: Secondary | ICD-10-CM | POA: Diagnosis not present

## 2021-11-04 DIAGNOSIS — R7303 Prediabetes: Secondary | ICD-10-CM | POA: Diagnosis not present

## 2021-11-04 DIAGNOSIS — I493 Ventricular premature depolarization: Secondary | ICD-10-CM | POA: Diagnosis not present

## 2021-11-04 LAB — ECHOCARDIOGRAM COMPLETE
Area-P 1/2: 3.37 cm2
S' Lateral: 3.5 cm

## 2021-11-09 NOTE — Progress Notes (Signed)
Called pt. No answer, left voicemail with instructions to call the office for results

## 2021-11-11 ENCOUNTER — Telehealth: Payer: Self-pay | Admitting: Cardiology

## 2021-11-11 DIAGNOSIS — I77810 Thoracic aortic ectasia: Secondary | ICD-10-CM

## 2021-11-11 MED ORDER — METOPROLOL SUCCINATE ER 25 MG PO TB24: 12.5000 mg | ORAL_TABLET | Freq: Every day | ORAL | 3 refills | Status: DC

## 2021-11-11 NOTE — Progress Notes (Signed)
Called patient to review Echocardiogram results and was sent to voicemail. Left message with instructions to call us back for results and medication advisement!

## 2021-11-11 NOTE — Telephone Encounter (Signed)
Left message to call back  

## 2021-11-11 NOTE — Telephone Encounter (Signed)
Pt updated with ECHO results along with NP's recommendations. Pt verbalized understanding.  Repeat ECHO and medication order placed.   Loel Dubonnet, NP  11/09/2021  3:31 PM EST     Echocardiogram shows normal heart pumping function.  Aortic root dilation increased from 44 to 34mm and ascending aorta dilation increased from 94mm to 23mm. Recommend repeat echo in 1 year for monitoring. To prevent progression, recommend adding Metoprolol Succinate (Toprol) half tablet (12.5mg ) daily.

## 2021-11-11 NOTE — Telephone Encounter (Signed)
Pt is returning call for Echo results

## 2021-11-12 NOTE — Telephone Encounter (Signed)
Patient returning call to discuss Echo results

## 2021-11-12 NOTE — Telephone Encounter (Signed)
Returned call to patient and answered follow up questions regarding Echo!

## 2021-11-26 DIAGNOSIS — L219 Seborrheic dermatitis, unspecified: Secondary | ICD-10-CM | POA: Diagnosis not present

## 2021-11-26 DIAGNOSIS — Z23 Encounter for immunization: Secondary | ICD-10-CM | POA: Diagnosis not present

## 2021-11-26 DIAGNOSIS — I872 Venous insufficiency (chronic) (peripheral): Secondary | ICD-10-CM | POA: Diagnosis not present

## 2021-12-08 DIAGNOSIS — M792 Neuralgia and neuritis, unspecified: Secondary | ICD-10-CM | POA: Diagnosis not present

## 2021-12-08 DIAGNOSIS — Z79899 Other long term (current) drug therapy: Secondary | ICD-10-CM | POA: Diagnosis not present

## 2021-12-08 DIAGNOSIS — M791 Myalgia, unspecified site: Secondary | ICD-10-CM | POA: Diagnosis not present

## 2021-12-08 DIAGNOSIS — M48062 Spinal stenosis, lumbar region with neurogenic claudication: Secondary | ICD-10-CM | POA: Diagnosis not present

## 2021-12-20 DIAGNOSIS — M419 Scoliosis, unspecified: Secondary | ICD-10-CM | POA: Diagnosis not present

## 2021-12-20 DIAGNOSIS — Z6825 Body mass index (BMI) 25.0-25.9, adult: Secondary | ICD-10-CM | POA: Diagnosis not present

## 2021-12-31 DIAGNOSIS — C678 Malignant neoplasm of overlapping sites of bladder: Secondary | ICD-10-CM | POA: Diagnosis not present

## 2022-01-11 DIAGNOSIS — C678 Malignant neoplasm of overlapping sites of bladder: Secondary | ICD-10-CM | POA: Diagnosis not present

## 2022-01-11 DIAGNOSIS — Z5111 Encounter for antineoplastic chemotherapy: Secondary | ICD-10-CM | POA: Diagnosis not present

## 2022-01-18 DIAGNOSIS — C678 Malignant neoplasm of overlapping sites of bladder: Secondary | ICD-10-CM | POA: Diagnosis not present

## 2022-01-18 DIAGNOSIS — Z5111 Encounter for antineoplastic chemotherapy: Secondary | ICD-10-CM | POA: Diagnosis not present

## 2022-01-25 DIAGNOSIS — Z5111 Encounter for antineoplastic chemotherapy: Secondary | ICD-10-CM | POA: Diagnosis not present

## 2022-01-25 DIAGNOSIS — C678 Malignant neoplasm of overlapping sites of bladder: Secondary | ICD-10-CM | POA: Diagnosis not present

## 2022-02-01 DIAGNOSIS — L814 Other melanin hyperpigmentation: Secondary | ICD-10-CM | POA: Diagnosis not present

## 2022-02-01 DIAGNOSIS — L738 Other specified follicular disorders: Secondary | ICD-10-CM | POA: Diagnosis not present

## 2022-02-01 DIAGNOSIS — Z23 Encounter for immunization: Secondary | ICD-10-CM | POA: Diagnosis not present

## 2022-02-01 DIAGNOSIS — D225 Melanocytic nevi of trunk: Secondary | ICD-10-CM | POA: Diagnosis not present

## 2022-02-01 DIAGNOSIS — L821 Other seborrheic keratosis: Secondary | ICD-10-CM | POA: Diagnosis not present

## 2022-02-01 DIAGNOSIS — L570 Actinic keratosis: Secondary | ICD-10-CM | POA: Diagnosis not present

## 2022-02-01 DIAGNOSIS — Z85828 Personal history of other malignant neoplasm of skin: Secondary | ICD-10-CM | POA: Diagnosis not present

## 2022-02-14 DIAGNOSIS — E782 Mixed hyperlipidemia: Secondary | ICD-10-CM | POA: Diagnosis not present

## 2022-02-14 DIAGNOSIS — M150 Primary generalized (osteo)arthritis: Secondary | ICD-10-CM | POA: Diagnosis not present

## 2022-02-14 DIAGNOSIS — K219 Gastro-esophageal reflux disease without esophagitis: Secondary | ICD-10-CM | POA: Diagnosis not present

## 2022-02-14 DIAGNOSIS — F330 Major depressive disorder, recurrent, mild: Secondary | ICD-10-CM | POA: Diagnosis not present

## 2022-03-24 ENCOUNTER — Emergency Department (HOSPITAL_COMMUNITY)
Admission: EM | Admit: 2022-03-24 | Discharge: 2022-03-24 | Disposition: A | Discharge status: Home / Self Care | Payer: PPO | Attending: Emergency Medicine | Admitting: Emergency Medicine

## 2022-03-24 ENCOUNTER — Emergency Department (HOSPITAL_COMMUNITY): Payer: PPO

## 2022-03-24 ENCOUNTER — Encounter (HOSPITAL_COMMUNITY): Payer: Self-pay

## 2022-03-24 DIAGNOSIS — S81852A Open bite, left lower leg, initial encounter: Secondary | ICD-10-CM | POA: Diagnosis not present

## 2022-03-24 DIAGNOSIS — W540XXA Bitten by dog, initial encounter: Secondary | ICD-10-CM | POA: Diagnosis not present

## 2022-03-24 DIAGNOSIS — S8992XA Unspecified injury of left lower leg, initial encounter: Secondary | ICD-10-CM | POA: Diagnosis present

## 2022-03-24 DIAGNOSIS — S81812A Laceration without foreign body, left lower leg, initial encounter: Secondary | ICD-10-CM | POA: Insufficient documentation

## 2022-03-24 LAB — CBC WITH DIFFERENTIAL/PLATELET
Abs Immature Granulocytes: 0.06 10*3/uL (ref 0.00–0.07)
Basophils Absolute: 0.1 10*3/uL (ref 0.0–0.1)
Basophils Relative: 1 %
Eosinophils Absolute: 0.1 10*3/uL (ref 0.0–0.5)
Eosinophils Relative: 1 %
HCT: 40.7 % (ref 39.0–52.0)
Hemoglobin: 12.7 g/dL — ABNORMAL LOW (ref 13.0–17.0)
Immature Granulocytes: 1 %
Lymphocytes Relative: 10 %
Lymphs Abs: 1.1 10*3/uL (ref 0.7–4.0)
MCH: 27.9 pg (ref 26.0–34.0)
MCHC: 31.2 g/dL (ref 30.0–36.0)
MCV: 89.5 fL (ref 80.0–100.0)
Monocytes Absolute: 1 10*3/uL (ref 0.1–1.0)
Monocytes Relative: 9 %
Neutro Abs: 8.7 10*3/uL — ABNORMAL HIGH (ref 1.7–7.7)
Neutrophils Relative %: 78 %
Platelets: 203 10*3/uL (ref 150–400)
RBC: 4.55 MIL/uL (ref 4.22–5.81)
RDW: 16.1 % — ABNORMAL HIGH (ref 11.5–15.5)
WBC: 11 10*3/uL — ABNORMAL HIGH (ref 4.0–10.5)
nRBC: 0 % (ref 0.0–0.2)

## 2022-03-24 LAB — BASIC METABOLIC PANEL
Anion gap: 7 (ref 5–15)
BUN: 12 mg/dL (ref 8–23)
CO2: 26 mmol/L (ref 22–32)
Calcium: 9.1 mg/dL (ref 8.9–10.3)
Chloride: 105 mmol/L (ref 98–111)
Creatinine, Ser: 0.94 mg/dL (ref 0.61–1.24)
GFR, Estimated: >60 mL/min (ref >60)
Glucose, Bld: 108 mg/dL — ABNORMAL HIGH (ref 70–99)
Potassium: 4.1 mmol/L (ref 3.5–5.1)
Sodium: 138 mmol/L (ref 135–145)

## 2022-03-24 MED ORDER — LIDOCAINE HCL 1 % IJ SOLN
INTRAMUSCULAR | Status: AC
  Filled 2022-03-24: qty 20

## 2022-03-24 MED ORDER — HYDROCODONE-ACETAMINOPHEN 5-325 MG PO TABS: 1.0000 | ORAL_TABLET | Freq: Four times a day (QID) | ORAL | 0 refills | Status: DC | PRN

## 2022-03-24 MED ORDER — HYDROMORPHONE HCL 1 MG/ML IJ SOLN
1.0000 mg | Freq: Once | INTRAMUSCULAR | Status: AC
  Administered 2022-03-24: 1 mg via INTRAVENOUS
  Filled 2022-03-24: qty 1

## 2022-03-24 MED ORDER — AMOXICILLIN-POT CLAVULANATE 875-125 MG PO TABS: 1.0000 | ORAL_TABLET | Freq: Two times a day (BID) | ORAL | 0 refills | Status: DC

## 2022-03-24 MED ORDER — SODIUM CHLORIDE 0.9 % IV SOLN
3.0000 g | Freq: Once | INTRAVENOUS | Status: AC
  Administered 2022-03-24: 3 g via INTRAVENOUS
  Filled 2022-03-24: qty 8

## 2022-03-24 NOTE — ED Triage Notes (Signed)
Pt states that he was attempting to break up his two dogs and was bitten on the L leg. Pt's pant leg and sock completely saturated with blood. Removed in triage by PA with NS. Multiple large lacerations noted. Pt states dogs are UTD on vaccinations. No blood thinners per pt. PMS intact

## 2022-03-24 NOTE — Discharge Instructions (Signed)
1.  Fill your prescription for your antibiotic and start taking it this evening.  Take twice daily as prescribed. 2.  For pain control you may take over-the-counter ibuprofen every 6-8 hours.  If you need additional pain control take 1-2 Vicodin tablets as prescribed.  Elevate your leg is much as possible.  This will help decrease swelling and pressure on the wound.  You may change the outer dressing if it becomes wet or has blood on it.  Try to leave the yellow gauze dressing in place. 3.  You should have a wound check within approximately 2 to 3 days.  Either return to the emergency department for recheck or see your family doctor.  Return to the emergency department sooner if you are having significantly increasing pain, fever, redness that is developing around the wounds or going up the leg.

## 2022-03-24 NOTE — ED Provider Notes (Signed)
[facility] DEPT Provider Note   CSN: [id number] Arrival date & time: 03/24/22  1107     History  Chief Complaint  Patient presents with   Animal Bite    Jeremy Day is a 71 y.o. male.  HPI Patient reports 2 of his dogs got in a fight and he was trying to break it up.  He ended up getting bitten several times in the left lower leg.  He reports he has a number gashes.  He reports that the pain is calming down at this point.  No other associated injury.  Patient is not on a blood thinner.  He does not have diabetes.  Patient reports all of his dog's immunizations are up-to-date.  Patient reports his tetanus is up-to-date.    Home Medications Prior to Admission medications   Medication Sig Start Date End Date Taking? Authorizing Provider  amoxicillin-clavulanate (AUGMENTIN) 875-125 MG tablet Take 1 tablet by mouth every 12 (twelve) hours. 03/24/22  Yes Charlesetta Shanks, MD  HYDROcodone-acetaminophen (NORCO/VICODIN) 5-325 MG tablet Take 1-2 tablets by mouth every 6 (six) hours as needed for moderate pain or severe pain. 03/24/22  Yes Charlesetta Shanks, MD  acetaminophen (TYLENOL) 500 MG tablet Take 1,000 mg by mouth every 6 (six) hours as needed (pain.).    [provider]  calcium carbonate (TUMS - DOSED IN MG ELEMENTAL CALCIUM) 500 MG chewable tablet Chew 1 tablet by mouth daily as needed for indigestion or heartburn.    [provider]  clobetasol cream (TEMOVATE) 0.62 % Apply 1 application topically 2 (two) times daily as needed (psoriasis).     [provider]  diphenhydrAMINE (BENADRYL) 25 MG tablet Take 25 mg by mouth every 6 (six) hours as needed for allergies.    [provider]  famotidine (PEPCID) 20 MG tablet Take 10 mg by mouth every morning.    [provider]  gabapentin (NEURONTIN) 800 MG tablet Take 800 mg by mouth 3 (three) times daily.    [provider]  metoprolol succinate (TOPROL XL) 25  MG 24 hr tablet Take 0.5 tablets (12.5 mg total) by mouth daily. 11/11/21   Loel Dubonnet, NP  mometasone (NASONEX) 50 MCG/ACT nasal spray Place 2 sprays into the nose daily as needed (allergies.).     [provider]  pimecrolimus (ELIDEL) 1 % cream Apply 1 application topically daily as needed (psoriasis).     [provider]  Polyethyl Glycol-Propyl Glycol (SYSTANE OP) Place 1 drop into both eyes 2 (two) times daily as needed (dry eyes).    [provider]      Allergies    Patient has no known allergies.    Review of Systems   Review of Systems Constitutional: No fever no chills no malaise Respiratory: No shortness of breath no chest pain Physical Exam Updated Vital Signs BP 133/86 (BP Location: Right Arm)   Pulse (!) 51   Temp 98.4 F (36.9 C) (Oral)   Resp 18   Ht 6' (1.829 m)   Wt 90.7 kg   SpO2 98%   BMI 27.12 kg/m  Physical Exam Constitutional:      Comments: Alert nontoxic well in appearance.  HENT:     Head: Normocephalic and atraumatic.     Mouth/Throat:     Pharynx: Oropharynx is clear.  Cardiovascular:     Rate and Rhythm: Normal rate and regular rhythm.  Pulmonary:     Effort: Pulmonary effort is normal.  Breath sounds: Normal breath sounds.  Musculoskeletal:     Comments: Patient has bite to the left lower leg that has multiple punctures plus a fairly large flap.  The ankle and foot are normal.  Good perfusion.  No other swelling.  See attached images.  The wounds on the posterior aspect of the leg are deeper punctures.  The wound to the lateral aspect also deeper puncture.  The flap appears to be fairly superficial.  Skin:    General: Skin is warm and dry.  Neurological:     General: No focal deficit present.     Mental Status: He is oriented to person, place, and time.     Coordination: Coordination normal.  Psychiatric:        Mood and Affect: Mood normal.         ED Results / Procedures / Treatments    Labs (all labs ordered are listed, but only abnormal results are displayed) Labs Reviewed  BASIC METABOLIC PANEL - Abnormal; Notable for the following components:      Result Value   Glucose, Bld 108 (*)    All other components within normal limits  CBC WITH DIFFERENTIAL/PLATELET - Abnormal; Notable for the following components:   WBC 11.0 (*)    Hemoglobin 12.7 (*)    RDW 16.1 (*)    Neutro Abs 8.7 (*)    All other components within normal limits    EKG None  Radiology DG Tibia/Fibula Left  Result Date: 03/24/2022 CLINICAL DATA:  Dog bite on left leg EXAM: LEFT TIBIA AND FIBULA - 2 VIEW COMPARISON:  None Available. FINDINGS: There is no acute fracture or dislocation. Bony alignment is normal. There is soft tissue irregularity along the anterior shin on the lateral projection likely reflecting laceration. There is no retained radiopaque foreign body. There is degenerative change about the knee primarily affecting the medial compartment. Vascular calcifications are noted. IMPRESSION: Soft tissue irregularity along the anterior shin likely reflecting laceration. No retained radiopaque foreign body or acute osseous abnormality. Electronically Signed   By: Valetta Mole M.D.   On: 03/24/2022 12:19    Procedures .Marland KitchenLaceration Repair  Date/Time: 03/24/2022 2:50 PM Performed by: Charlesetta Shanks, MD Authorized by: Charlesetta Shanks, MD   Consent:    Consent obtained:  Verbal   Consent given by:  Patient   Risks discussed:  Infection, pain, retained foreign body, tendon damage, poor cosmetic result, need for additional repair, nerve damage, poor wound healing and vascular damage Anesthesia:    Anesthesia method:  Local infiltration   Local anesthetic:  Lidocaine 1% w/o epi Laceration details:    Location:  Leg   Leg location:  L lower leg   Length (cm):  4   Depth (mm):  5 Pre-procedure details:    Preparation:  Patient was prepped and draped in usual sterile fashion and imaging  obtained to evaluate for foreign bodies Exploration:    Hemostasis achieved with:  Direct pressure   Imaging outcome: foreign body not noted     Wound exploration: entire depth of wound visualized     Wound extent: areolar tissue violated and muscle damage   Treatment:    Area cleansed with:  Saline and Shur-Clens   Amount of cleaning:  Extensive   Irrigation solution:  Sterile saline   Irrigation volume:  500   Irrigation method:  Pressure wash   Debridement:  Minimal   Undermining:  None Skin repair:    Repair method:  Sutures  Suture size:  4-0   Suture material:  Nylon   Suture technique:  Simple interrupted   Number of sutures:  4 Approximation:    Approximation:  Loose Repair type:    Repair type:  Complex Post-procedure details:    Dressing:  Non-adherent dressing   Procedure completion:  Tolerated well, no immediate complications Comments:     Wound have been extensively cleaned and irrigated.  Flap was approximated with 3 sutures.  Larger puncture laceration to the left of the flap 1 suture placed fairly loosely.  Punctures left open and event of deep infection and need for drainage.  Xeroform gauze put over all of the wounds.  Dressing placed by myself.    Medications Ordered in ED Medications  lidocaine (XYLOCAINE) 1 % (with pres) injection (0 mg  Hold 03/24/22 1308)  Ampicillin-Sulbactam (UNASYN) 3 g in sodium chloride 0.9 % 100 mL IVPB (3 g Intravenous Bolus 03/24/22 1247)  HYDROmorphone (DILAUDID) injection 1 mg (1 mg Intravenous Given 03/24/22 1246)    ED Course/ Medical Decision Making/ A&P                           Medical Decision Making Amount and/or Complexity of Data Reviewed Labs: ordered.  Risk Prescription drug management.   Patient has extensive dog bite to lower leg.  Will administer 1 dose of Unasyn.  X-rays are reviewed by radiology negative for acute findings.  Wound was extensively cleaned and irrigated.  Subsequently I placed loose  sutures to hold the flap in place and one suture on the larger tear with puncture.  Approximations are fairly loose.  Patient is aware that this wound has significant risk for infection due to deep punctures from a bite wound.  He is also aware there may be tissue necrosis over the small flap.  He and his wife are counseled for close follow-up.  If they cannot be seen by PCP should be rechecked in the emergency department within 2 to 3 days.  Tetanus was up-to-date.  IV dose of antibiotics with Unasyn given in the emergency department.  Be pain control with Dilaudid provided for irrigation and wound cleaning.        Final Clinical Impression(s) / ED Diagnoses Final diagnoses:  Dog bite, initial encounter    Rx / DC Orders ED Discharge Orders          Ordered    amoxicillin-clavulanate (AUGMENTIN) 875-125 MG tablet  Every 12 hours        03/24/22 1445    HYDROcodone-acetaminophen (NORCO/VICODIN) 5-325 MG tablet  Every 6 hours PRN        03/24/22 1445              Charlesetta Shanks, MD 03/24/22 1453

## 2022-03-24 NOTE — ED Provider Triage Note (Signed)
Emergency Medicine Provider Triage Evaluation Note  DEMITRIOUS MCCANNON , a 71 y.o. male  was evaluated in triage.  Pt presents after being bitten by one of his dogs, a boxer.  Reportedly 2 of his dogs were fighting and he tried to intervene and one of them latched onto his left lower extremity.  Patient had significant bleeding, not well controlled prior to arrival.  He reports animal is up-to-date on his rabies vaccine.  He denies numbness and tingling.  Pain comes and goes.  Review of Systems  Positive:  Negative: As above  Physical Exam  Ht 6' (1.829 m)   Wt 90.7 kg   BMI 27.12 kg/m  Gen:   Awake, no distress, pleasant Resp:  Normal effort  MSK:   Moves extremities without difficulty; left lower extremity with large jagged laceration of the anterior lateral left shin approximately 4 cm large, another puncture wound just next to it.  Smaller approximately 1 cm jagged laceration on the posterior calf. Other:  Distal pulses intact, patient is able to wiggle his toes  Medical Decision Making  Medically screening exam initiated at 11:27 AM.  Appropriate orders placed.  Gabriel Cirri was informed that the remainder of the evaluation will be completed by another provider, this initial triage assessment does not replace that evaluation, and the importance of remaining in the ED until their evaluation is complete.  EMT cleaned and flushed wound with light wrapping.   Tonye Pearson, [place] 03/24/22 1129

## 2022-03-28 DIAGNOSIS — S81819D Laceration without foreign body, unspecified lower leg, subsequent encounter: Secondary | ICD-10-CM | POA: Diagnosis not present

## 2022-03-28 DIAGNOSIS — W540XXD Bitten by dog, subsequent encounter: Secondary | ICD-10-CM | POA: Diagnosis not present

## 2022-04-01 DIAGNOSIS — S81812D Laceration without foreign body, left lower leg, subsequent encounter: Secondary | ICD-10-CM | POA: Diagnosis not present

## 2022-04-01 DIAGNOSIS — W540XXD Bitten by dog, subsequent encounter: Secondary | ICD-10-CM | POA: Diagnosis not present

## 2022-04-01 DIAGNOSIS — C678 Malignant neoplasm of overlapping sites of bladder: Secondary | ICD-10-CM | POA: Diagnosis not present

## 2022-04-21 DIAGNOSIS — Z6825 Body mass index (BMI) 25.0-25.9, adult: Secondary | ICD-10-CM | POA: Diagnosis not present

## 2022-04-21 DIAGNOSIS — M4316 Spondylolisthesis, lumbar region: Secondary | ICD-10-CM | POA: Diagnosis not present

## 2022-04-21 DIAGNOSIS — M419 Scoliosis, unspecified: Secondary | ICD-10-CM | POA: Diagnosis not present

## 2022-04-26 DIAGNOSIS — C678 Malignant neoplasm of overlapping sites of bladder: Secondary | ICD-10-CM | POA: Diagnosis not present

## 2022-04-26 DIAGNOSIS — Z5111 Encounter for antineoplastic chemotherapy: Secondary | ICD-10-CM | POA: Diagnosis not present

## 2022-05-03 DIAGNOSIS — Z5111 Encounter for antineoplastic chemotherapy: Secondary | ICD-10-CM | POA: Diagnosis not present

## 2022-05-03 DIAGNOSIS — C678 Malignant neoplasm of overlapping sites of bladder: Secondary | ICD-10-CM | POA: Diagnosis not present

## 2022-05-11 DIAGNOSIS — Z5111 Encounter for antineoplastic chemotherapy: Secondary | ICD-10-CM | POA: Diagnosis not present

## 2022-05-11 DIAGNOSIS — C678 Malignant neoplasm of overlapping sites of bladder: Secondary | ICD-10-CM | POA: Diagnosis not present

## 2022-05-17 DIAGNOSIS — L821 Other seborrheic keratosis: Secondary | ICD-10-CM | POA: Diagnosis not present

## 2022-05-17 DIAGNOSIS — L570 Actinic keratosis: Secondary | ICD-10-CM | POA: Diagnosis not present

## 2022-08-01 DIAGNOSIS — F330 Major depressive disorder, recurrent, mild: Secondary | ICD-10-CM | POA: Diagnosis not present

## 2022-08-01 DIAGNOSIS — K219 Gastro-esophageal reflux disease without esophagitis: Secondary | ICD-10-CM | POA: Diagnosis not present

## 2022-08-01 DIAGNOSIS — M150 Primary generalized (osteo)arthritis: Secondary | ICD-10-CM | POA: Diagnosis not present

## 2022-08-01 DIAGNOSIS — E782 Mixed hyperlipidemia: Secondary | ICD-10-CM | POA: Diagnosis not present

## 2022-08-04 DIAGNOSIS — R3915 Urgency of urination: Secondary | ICD-10-CM | POA: Diagnosis not present

## 2022-08-04 DIAGNOSIS — R350 Frequency of micturition: Secondary | ICD-10-CM | POA: Diagnosis not present

## 2022-08-04 DIAGNOSIS — N401 Enlarged prostate with lower urinary tract symptoms: Secondary | ICD-10-CM | POA: Diagnosis not present

## 2022-08-04 DIAGNOSIS — C678 Malignant neoplasm of overlapping sites of bladder: Secondary | ICD-10-CM | POA: Diagnosis not present

## 2022-09-19 DIAGNOSIS — L570 Actinic keratosis: Secondary | ICD-10-CM | POA: Diagnosis not present

## 2022-09-19 DIAGNOSIS — I872 Venous insufficiency (chronic) (peripheral): Secondary | ICD-10-CM | POA: Diagnosis not present

## 2022-10-06 DIAGNOSIS — M25561 Pain in right knee: Secondary | ICD-10-CM | POA: Diagnosis not present

## 2022-10-11 ENCOUNTER — Telehealth (HOSPITAL_BASED_OUTPATIENT_CLINIC_OR_DEPARTMENT_OTHER): Payer: Self-pay | Admitting: Family

## 2022-10-11 NOTE — Telephone Encounter (Signed)
Left message for patient to call and discuss scheduling the follow up Echo ordered by Laurann [place], NP

## 2022-11-03 DIAGNOSIS — C678 Malignant neoplasm of overlapping sites of bladder: Secondary | ICD-10-CM | POA: Diagnosis not present

## 2022-11-04 ENCOUNTER — Other Ambulatory Visit: Payer: Self-pay | Admitting: Urology

## 2022-11-04 ENCOUNTER — Ambulatory Visit (INDEPENDENT_AMBULATORY_CARE_PROVIDER_SITE_OTHER): Payer: PPO

## 2022-11-04 ENCOUNTER — Telehealth (HOSPITAL_BASED_OUTPATIENT_CLINIC_OR_DEPARTMENT_OTHER): Payer: Self-pay

## 2022-11-04 DIAGNOSIS — I77810 Thoracic aortic ectasia: Secondary | ICD-10-CM

## 2022-11-04 LAB — ECHOCARDIOGRAM COMPLETE
Area-P 1/2: 2.54 cm2
P 1/2 time: 339 msec
S' Lateral: 2.22 cm

## 2022-11-04 NOTE — Telephone Encounter (Addendum)
Called results to patient and left results on VM (ok per DPR), instructions left to call office back if patient has any questions!     ----- Message from Loel Dubonnet, NP sent at 11/04/2022  5:05 PM EST ----- Echocardiogram with normal heart muscle function.  Moderate thickening and mild stiffness of the heart muscle which we prevent from worsening by keeping the blood pressure well-controlled.    Mild dilation of aortic root 45 mm and mild dilation of ascending aorta 44 mm.  This is increased from previous.  Recommend scheduling office visit to ensure blood pressure control at goal is due for annual follow-up.

## 2022-11-10 DIAGNOSIS — M4120 Other idiopathic scoliosis, site unspecified: Secondary | ICD-10-CM | POA: Diagnosis not present

## 2022-11-10 DIAGNOSIS — M4316 Spondylolisthesis, lumbar region: Secondary | ICD-10-CM | POA: Diagnosis not present

## 2022-11-10 DIAGNOSIS — T84216A Breakdown (mechanical) of internal fixation device of vertebrae, initial encounter: Secondary | ICD-10-CM | POA: Diagnosis not present

## 2022-11-11 NOTE — Progress Notes (Signed)
COVID Vaccine Completed: yes  Date of COVID positive in last 90 days:  PCP - London Pepper, MD Cardiologist - Candee Furbish, MD (LOV 10/07/21)  Chest x-ray -  EKG -  Stress Test -  ECHO - 11/04/22 Epic Cardiac Cath -  Pacemaker/ICD device last checked: Spinal Cord Stimulator:  Bowel Prep -   Sleep Study -  CPAP -   Fasting Blood Sugar - pre DM Checks Blood Sugar _____ times a day  Last dose of GLP1 agonist-  N/A GLP1 instructions:  N/A   Last dose of SGLT-2 inhibitors-  N/A SGLT-2 instructions: N/A   Blood Thinner Instructions: Aspirin Instructions: Last Dose:  Activity level:  Can go up a flight of stairs and perform activities of daily living without stopping and without symptoms of chest pain or shortness of breath.  Able to exercise without symptoms  Unable to go up a flight of stairs without symptoms of     Anesthesia review: ventricular bigeminy, PVCs, pre DM, dysphagia  Patient denies shortness of breath, fever, cough and chest pain at PAT appointment  Patient verbalized understanding of instructions that were given to them at the PAT appointment. Patient was also instructed that they will need to review over the PAT instructions again at home before surgery.

## 2022-11-11 NOTE — Patient Instructions (Signed)
SURGICAL WAITING ROOM VISITATION  Patients having surgery or a procedure may have no more than 2 support people in the waiting area - these visitors may rotate.    Children under the age of 61 must have an adult with them who is not the patient.  Due to an increase in RSV and influenza rates and associated hospitalizations, children ages 57 and under may not visit patients in [place].  If the patient needs to stay at the hospital during part of their recovery, the visitor guidelines for inpatient rooms apply. Pre-op nurse will coordinate an appropriate time for 1 support person to accompany patient in pre-op.  This support person may not rotate.    Please refer to the [facility] website for the visitor guidelines for Inpatients (after your surgery is over and you are in a regular room).    Your procedure is scheduled on: 11/21/22   Report to [facility] Main Entrance    Report to admitting at 7:00 AM   Call this number if you have problems the morning of surgery [phone]   Do not eat food or drink liquids :After Midnight.          If you have questions, please contact your surgeon's office.   FOLLOW BOWEL PREP AND ANY ADDITIONAL PRE OP INSTRUCTIONS YOU RECEIVED FROM YOUR SURGEON'S OFFICE!!!     Oral Hygiene is also important to reduce your risk of infection.                                    Remember - BRUSH YOUR TEETH THE MORNING OF SURGERY WITH YOUR REGULAR TOOTHPASTE  DENTURES WILL BE REMOVED PRIOR TO SURGERY PLEASE DO NOT APPLY "Poly grip" OR ADHESIVES!!!   Take these medicines the morning of surgery with A SIP OF WATER: Tylenol, Pepcid, Metoprolol                               You may not have any metal on your body including jewelry, and body piercing             Do not wear lotions, powders, cologne, or deodorant              Men may shave face and neck.   Do not bring valuables to the hospital. [place].   Contacts, glasses, dentures or bridgework may not be worn into surgery.  DO NOT [place]. PHARMACY WILL DISPENSE MEDICATIONS LISTED ON YOUR MEDICATION LIST TO YOU DURING YOUR ADMISSION [place]!    Patients discharged on the day of surgery will not be allowed to drive home.  Someone NEEDS to stay with you for the first 24 hours after anesthesia.              Please read over the following fact sheets you were given: IF [place] [phone] Schneiders    If you received a COVID test during your pre-op visit  it is requested that you wear a mask when out in public, stay away from anyone that may not be feeling well and notify your surgeon if you develop symptoms. If you test positive for Covid or  have been in contact with anyone that has tested positive in the last 10 days please notify you surgeon.    [place] - Preparing for Surgery Before surgery, you can play an important role.  Because skin is not sterile, your skin needs to be as free of germs as possible.  You can reduce the number of germs on your skin by washing with CHG (chlorahexidine gluconate) soap before surgery.  CHG is an antiseptic cleaner which kills germs and bonds with the skin to continue killing germs even after washing. Please DO NOT use if you have an allergy to CHG or antibacterial soaps.  If your skin becomes reddened/irritated stop using the CHG and inform your nurse when you arrive at Short Stay. Do not shave (including legs and underarms) for at least 48 hours prior to the first CHG shower.  You may shave your face/neck.  Please follow these instructions carefully:  1.  Shower with CHG Soap the night before surgery and the  morning of surgery.  2.  If you choose to wash your hair, wash your hair first as usual with your normal  shampoo.  3.  After you shampoo, rinse your hair and body thoroughly to remove  the shampoo.                             4.  Use CHG as you would any other liquid soap.  You can apply chg directly to the skin and wash.  Gently with a scrungie or clean washcloth.  5.  Apply the CHG Soap to your body ONLY FROM THE NECK DOWN.   Do   not use on face/ open                           Wound or open sores. Avoid contact with eyes, ears mouth and   genitals (private parts).                       Wash face,  Genitals (private parts) with your normal soap.             6.  Wash thoroughly, paying special attention to the area where your    surgery  will be performed.  7.  Thoroughly rinse your body with warm water from the neck down.  8.  DO NOT shower/wash with your normal soap after using and rinsing off the CHG Soap.                9.  Pat yourself dry with a clean towel.            10.  Wear clean pajamas.            11.  Place clean sheets on your bed the night of your first shower and do not  sleep with pets. Day of Surgery : Do not apply any lotions/deodorants the morning of surgery.  Please wear clean clothes to the hospital/surgery center.  FAILURE TO FOLLOW THESE INSTRUCTIONS MAY RESULT IN THE CANCELLATION OF YOUR SURGERY  PATIENT SIGNATURE_________________________________  NURSE SIGNATURE__________________________________  ________________________________________________________________________

## 2022-11-14 ENCOUNTER — Encounter (HOSPITAL_COMMUNITY)
Admission: RE | Admit: 2022-11-14 | Discharge: 2022-11-14 | Disposition: A | Discharge status: Home / Self Care | Payer: PPO | Source: Ambulatory Visit | Attending: Urology | Admitting: Urology

## 2022-11-14 ENCOUNTER — Encounter (HOSPITAL_COMMUNITY): Payer: Self-pay

## 2022-11-14 VITALS — BP 122/81 | HR 65 | Temp 97.7°F | Resp 12 | Ht 72.0 in | Wt 193.4 lb

## 2022-11-14 DIAGNOSIS — Z87891 Personal history of nicotine dependence: Secondary | ICD-10-CM | POA: Diagnosis not present

## 2022-11-14 DIAGNOSIS — I2510 Atherosclerotic heart disease of native coronary artery without angina pectoris: Secondary | ICD-10-CM | POA: Diagnosis not present

## 2022-11-14 DIAGNOSIS — R7303 Prediabetes: Secondary | ICD-10-CM | POA: Diagnosis not present

## 2022-11-14 DIAGNOSIS — Z01818 Encounter for other preprocedural examination: Secondary | ICD-10-CM | POA: Diagnosis not present

## 2022-11-14 DIAGNOSIS — D414 Neoplasm of uncertain behavior of bladder: Secondary | ICD-10-CM | POA: Diagnosis not present

## 2022-11-14 LAB — CBC
HCT: 47.1 % (ref 39.0–52.0)
Hemoglobin: 14.7 g/dL (ref 13.0–17.0)
MCH: 28.4 pg (ref 26.0–34.0)
MCHC: 31.2 g/dL (ref 30.0–36.0)
MCV: 91.1 fL (ref 80.0–100.0)
Platelets: 200 K/uL (ref 150–400)
RBC: 5.17 MIL/uL (ref 4.22–5.81)
RDW: 15.7 % — ABNORMAL HIGH (ref 11.5–15.5)
WBC: 6.8 K/uL (ref 4.0–10.5)
nRBC: 0 % (ref 0.0–0.2)

## 2022-11-14 LAB — BASIC METABOLIC PANEL WITH GFR
Anion gap: 9 (ref 5–15)
BUN: 13 mg/dL (ref 8–23)
CO2: 26 mmol/L (ref 22–32)
Calcium: 9 mg/dL (ref 8.9–10.3)
Chloride: 102 mmol/L (ref 98–111)
Creatinine, Ser: 0.84 mg/dL (ref 0.61–1.24)
GFR, Estimated: >60 mL/min
Glucose, Bld: 83 mg/dL (ref 70–99)
Potassium: 3.6 mmol/L (ref 3.5–5.1)
Sodium: 137 mmol/L (ref 135–145)

## 2022-11-15 ENCOUNTER — Telehealth: Payer: Self-pay | Admitting: Cardiology

## 2022-11-15 LAB — HEMOGLOBIN A1C
Hgb A1c MFr Bld: 5.9 % — ABNORMAL HIGH (ref 4.8–5.6)
Mean Plasma Glucose: 123 mg/dL

## 2022-11-15 NOTE — Telephone Encounter (Signed)
Left message to call back to schedule for IN OFFICE APPT for pre op clearance.

## 2022-11-15 NOTE — Telephone Encounter (Signed)
Pt has appt 11/16/22 with Ambrose Pancoast, NP for pre op clearance.

## 2022-11-15 NOTE — Telephone Encounter (Signed)
   Name: Jeremy Day  DOB: 1950-12-20  MRN: [id number]  Primary Cardiologist: Candee Furbish, MD  Chart reviewed as part of pre-operative protocol coverage. Because of Jeremy Day's past medical history and time since last visit, he will require a follow-up in-office visit in order to better assess preoperative cardiovascular risk.  Pre-op covering staff: - Please schedule appointment and call patient to inform them. If patient already had an upcoming appointment within acceptable timeframe, please add "pre-op clearance" to the appointment notes so provider is aware. - Please contact requesting surgeon's office via preferred method (i.e, phone, fax) to inform them of need for appointment prior to surgery.  No medications indicated as needing held.  Elgie Collard, PA-C  11/15/2022, 1:11 PM

## 2022-11-15 NOTE — Progress Notes (Signed)
Anesthesia Chart Review   Case: [id number] Date/Time: 11/21/22 0900   Procedure: CYSTOSCOPY BILATERAL RETROGRADE PYELOGRAM TRANSURETHRAL RESECTION OF BLADDER TUMOR WITH GEMCITABINE (Bilateral) - 1 HR FOR CASE   Anesthesia type: General   Pre-op diagnosis: BLADDER TUMOR   Location: [place] [facility]   Surgeons: Lucas Mallow, MD       DISCUSSION:72 y.o. former smoker with h/o PVCs, aortic root dilation, ventricular bigeminy, bladder tumor scheduled for above procedure 11/21/2022 with Dr. Link Snuffer.   Last seen by cardiology 10/07/2021.   Cardiac clearance requested.  VS: BP 122/81   Pulse 65   Temp 36.5 C (Oral)   Resp 12   Ht 6' (1.829 m)   Wt 87.7 kg   SpO2 97%   BMI 26.23 kg/m   PROVIDERS: [name], MD is PCP   Cardiologist - Candee Furbish, MD  LABS: Labs reviewed: Acceptable for surgery. (all labs ordered are listed, but only abnormal results are displayed)  Labs Reviewed  HEMOGLOBIN A1C - Abnormal; Notable for the following components:      Result Value   Hgb A1c MFr Bld 5.9 (*)    All other components within normal limits  CBC - Abnormal; Notable for the following components:   RDW 15.7 (*)    All other components within normal limits  BASIC METABOLIC PANEL     IMAGES:   EKG:   CV: Echo 11/04/2022 1. Left ventricular ejection fraction, by estimation, is 65 to 70%. Left  ventricular ejection fraction by 3D volume is 68 %. The left ventricle has  normal function. The left ventricle has no regional wall motion  abnormalities. There is moderate left  ventricular hypertrophy of the basal-septal segment. Left ventricular  diastolic parameters are consistent with Grade I diastolic dysfunction  (impaired relaxation). The average left ventricular global longitudinal  strain is -16.6 %. The global  longitudinal strain is abnormal.   2. Right ventricular systolic function is normal. The right ventricular  size is normal. There is  normal pulmonary artery systolic pressure. The  estimated right ventricular systolic pressure is 05.6 mmHg.   3. The mitral valve is normal in structure. Trivial mitral valve  regurgitation.   4. The aortic valve is tricuspid. There is mild thickening of the aortic  valve. Aortic valve regurgitation is trivial. Aortic valve sclerosis is  present, with no evidence of aortic valve stenosis.   5. Aortic dilatation noted. There is mild dilatation of the aortic root,  measuring 45 mm. There is mild dilatation of the ascending aorta,  measuring 44 mm.   6. The inferior vena cava is normal in size with greater than 50%  respiratory variability, suggesting right atrial pressure of 3 mmHg.   Comparison(s): No significant change from prior study.  Past Medical History:  Diagnosis Date   Abnormal tandem walk    using 2 canes due to lower back arthritis   Ascending aorta dilatation (HCC)    echo  04-11-2016  30m and dilated aortc root 354m  Bladder tumor    BPH (benign prostatic hyperplasia)    [place]   [place]   Depression    Diverticulosis    Full dentures    GERD (gastroesophageal reflux disease)    Hiatal hernia    History of colon polyps    History of staphylococcal infection 2008   right ankle  w/ sepsis   Meningitis spinal age 12 82r 6   OA (osteoarthritis)  lower back, right ankle,, bilateral knees and shouldres   Pneumonia yrs ago   Pre-diabetes    Psoriasis    PVC's (premature ventricular contractions)    2012-- hx bigeminy/ trigeminy with near syncope (cardiology consult note in epic dated 2012)  (last cardiology visit in epic w/ dr Marlou Porch, 03-27-2016)   Seasonal allergies    Urgency of urination     Past Surgical History:  Procedure Laterality Date   ANKLE ARTHROSCOPY Right 02-13-2007    dr [name]   w/ debridement and removal loose body   COLONOSCOPY  last one summer 2019   [place] Right 1980s   INGUINAL LYMPH NODE BIOPSY  2001  approx.    benign   SPINAL FUSION     "T8 through sacral area".   TONSILLECTOMY     TRANSURETHRAL RESECTION OF BLADDER TUMOR N/A 02/06/2019   Procedure: TRANSURETHRAL RESECTION OF BLADDER TUMOR (TURBT);  Surgeon: Lucas Mallow, MD;  Location: [facility];  Service: Urology;  Laterality: N/A;   TRANSURETHRAL RESECTION OF BLADDER TUMOR N/A 02/20/2019   Procedure: TRANSURETHRAL RESECTION OF BLADDER TUMOR (TURBT);  Surgeon: Lucas Mallow, MD;  Location: [facility];  Service: Urology;  Laterality: N/A;   TRANSURETHRAL RESECTION OF BLADDER TUMOR N/A 03/13/2019   Procedure: TRANSURETHRAL RESECTION OF BLADDER TUMOR (TURBT);  Surgeon: Lucas Mallow, MD;  [facility]: [facility];  Service: Urology;  Laterality: N/A;   TRANSURETHRAL RESECTION OF BLADDER TUMOR N/A 11/20/2020   Procedure: TRANSURETHRAL RESECTION OF BLADDER TUMOR (TURBT) WITH INSTILLATION OF post-op GEMCITABINE;  Surgeon: Janith Lima, MD;  Location: [facility];  Service: Urology;  Laterality: N/A;   TRANSURETHRAL RESECTION OF BLADDER TUMOR WITH MITOMYCIN-C N/A 09/23/2019   Procedure: TRANSURETHRAL RESECTION OF BLADDER TUMOR WITH GEMCITABINE INSTILLATION INTO THE BLADDER;  Surgeon: Lucas Mallow, MD;  [facility]: [facility];  Service: Urology;  Laterality: N/A;   TRANSURETHRAL RESECTION OF BLADDER TUMOR WITH MITOMYCIN-C N/A 06/24/2020   Procedure: TRANSURETHRAL RESECTION OF BLADDER TUMOR WITH POST OPERATIVE GEMCITABINE INSTILLATION;  Surgeon: Lucas Mallow, MD;  Location: [place];  Service: Urology;  Laterality: N/A;    MEDICATIONS:  acetaminophen (TYLENOL) 500 MG tablet   amoxicillin-clavulanate (AUGMENTIN) 875-125 MG tablet   calcium carbonate (TUMS - DOSED IN MG ELEMENTAL CALCIUM) 500 MG chewable tablet   diphenhydrAMINE (BENADRYL) 25 MG tablet   famotidine (PEPCID) 20 MG tablet   HYDROcodone-acetaminophen (NORCO/VICODIN) 5-325 MG tablet   metoprolol succinate (TOPROL XL) 25 MG 24 hr  tablet   mometasone (NASONEX) 50 MCG/ACT nasal spray   pimecrolimus (ELIDEL) 1 % cream   Polyethyl Glycol-Propyl Glycol (SYSTANE OP)   triamcinolone ointment (KENALOG) 0.1 %   No current facility-administered medications for this encounter.     Jeremy Felix Ward, PA-C [facility] [phone]

## 2022-11-15 NOTE — Progress Notes (Unsigned)
Office Visit    Patient Name: Jeremy Day Date of Encounter: 11/15/2022  Primary Care Provider:  [name], MD Primary Cardiologist:  Candee Furbish, MD Primary Electrophysiologist: None  Chief Complaint    Jeremy Day is a 72 y.o. male with PMH of ventricular bigeminy with near syncope 2012, aortic root dilation, bladder CA, GERD who presents today for preoperative clearance for bladder surgery.  Past Medical History    Past Medical History:  Diagnosis Date   Abnormal tandem walk    using 2 canes due to lower back arthritis   Ascending aorta dilatation (HCC)    echo  04-11-2016  7m and dilated aortc root 365m  Bladder tumor    BPH (benign prostatic hyperplasia)    [place]   bladder   Depression    Diverticulosis    Full dentures    GERD (gastroesophageal reflux disease)    Hiatal hernia    History of colon polyps    History of staphylococcal infection 2008   right ankle  w/ sepsis   Meningitis spinal age 22 35r 6   OA (osteoarthritis)    lower back, right ankle,, bilateral knees and shouldres   Pneumonia yrs ago   Pre-diabetes    Psoriasis    PVC's (premature ventricular contractions)    2012-- hx bigeminy/ trigeminy with near syncope (cardiology consult note in epic dated 2012)  (last cardiology visit in epic w/ dr skMarlou Porch05-21-2017)   Seasonal allergies    Urgency of urination    Past Surgical History:  Procedure Laterality Date   ANKLE ARTHROSCOPY Right 02-13-2007    dr grBerenice Primas w/ debridement and removal loose body   [place]   INGUINAL LYMPH NODE BIOPSY  2001  approx.   benign   SPINAL FUSION     "T8 through sacral area".   TONSILLECTOMY     TRANSURETHRAL RESECTION OF BLADDER TUMOR N/A 02/06/2019   Procedure: TRANSURETHRAL RESECTION OF BLADDER TUMOR (TURBT);  Surgeon: BeLucas MallowMD;  [facility];  Service: Urology;  Laterality: N/A;   TRANSURETHRAL  RESECTION OF BLADDER TUMOR N/A 02/20/2019   Procedure: TRANSURETHRAL RESECTION OF BLADDER TUMOR (TURBT);  Surgeon: BeLucas MallowMD;  [facility];  Service: Urology;  Laterality: N/A;   TRANSURETHRAL RESECTION OF BLADDER TUMOR N/A 03/13/2019   Procedure: TRANSURETHRAL RESECTION OF BLADDER TUMOR (TURBT);  Surgeon: BeLucas MallowMD;  [facility];  Service: Urology;  Laterality: N/A;   TRANSURETHRAL RESECTION OF BLADDER TUMOR N/A 11/20/2020   Procedure: TRANSURETHRAL RESECTION OF BLADDER TUMOR (TURBT) WITH INSTILLATION OF post-op GEMCITABINE;  Surgeon: GaJanith LimaMD;  [facility];  Laterality: N/A;   TRANSURETHRAL RESECTION OF BLADDER TUMOR WITH MITOMYCIN-C N/A 09/23/2019   Procedure: TRANSURETHRAL RESECTION OF BLADDER TUMOR WITH GEMCITABINE INSTILLATION INTO THE BLADDER;  Surgeon: BeLucas MallowMD;  [facility];  Laterality: N/A;   TRANSURETHRAL RESECTION OF BLADDER TUMOR WITH MITOMYCIN-C N/A 06/24/2020   Procedure: TRANSURETHRAL RESECTION OF BLADDER TUMOR WITH POST OPERATIVE GEMCITABINE INSTILLATION;  Surgeon: BeLucas MallowMD;  [facility];  Laterality: N/A;    Allergies  No Known Allergies  History of Present Illness    Jeremy MISNERis a 89ear old male with  the above mention past medical history who presents today for preoperative clearance for bladder surgery.  Jeremy Day was recently seen in 2012 by Dr. Marlou Porch for evaluation of ventricular bigeminy and abnormal EKG.  He had 24-hour Holter monitor placed showing minimal PVCs which are asymptomatic.  Most recent 2D echo completed 10/2022 for monitoring aortic root dilation which revealed mild dilation of the aortic root at 45 mm and mild dilation of ascending aorta at 44 mm, EF was 65 to 70% with no RWMA and moderate LVH.  Jeremy Day presents today for preoperative  clearance appointment.  Since last being seen in the office patient reports that he is doing well with no new cardiac complaints.  His blood pressure is well-controlled at 134/74 and heart rate was 58 bpm.  He reports no cardiac changes recently and is scheduled to undergo bladder cancer surgery.  During our visit we discussed cardiac risk and also reviewed his recent 2D echo results and all questions were answered to patient's satisfaction.  Patient denies chest pain, palpitations, dyspnea, PND, orthopnea, nausea, vomiting, dizziness, syncope, edema, weight gain, or early satiety.  Home Medications    Current Outpatient Medications  Medication Sig Dispense Refill   acetaminophen (TYLENOL) 500 MG tablet Take 1,000 mg by mouth every 6 (six) hours as needed for moderate pain.     amoxicillin-clavulanate (AUGMENTIN) 875-125 MG tablet Take 1 tablet by mouth every 12 (twelve) hours. (Patient not taking: Reported on 11/10/2022) 14 tablet 0   calcium carbonate (TUMS - DOSED IN MG ELEMENTAL CALCIUM) 500 MG chewable tablet Chew 1 tablet by mouth daily as needed for indigestion or heartburn.     diphenhydrAMINE (BENADRYL) 25 MG tablet Take 25 mg by mouth every 6 (six) hours as needed for allergies.     famotidine (PEPCID) 20 MG tablet Take 10 mg by mouth every morning.     HYDROcodone-acetaminophen (NORCO/VICODIN) 5-325 MG tablet Take 1-2 tablets by mouth every 6 (six) hours as needed for moderate pain or severe pain. (Patient not taking: Reported on 11/10/2022) 20 tablet 0   metoprolol succinate (TOPROL XL) 25 MG 24 hr tablet Take 0.5 tablets (12.5 mg total) by mouth daily. 45 tablet 3   mometasone (NASONEX) 50 MCG/ACT nasal spray Place 2 sprays into the nose daily as needed (allergies).     pimecrolimus (ELIDEL) 1 % cream Apply 1 application  topically daily as needed (eczema).     Polyethyl Glycol-Propyl Glycol (SYSTANE OP) Place 1 drop into both eyes 2 (two) times daily as needed (dry eyes).     triamcinolone  ointment (KENALOG) 0.1 % Apply 1 Application topically daily as needed (psoriasis).     No current facility-administered medications for this visit.     Review of Systems  Please see the history of present illness.      All other systems reviewed and are otherwise negative except as noted above.  Physical Exam    Wt Readings from Last 3 Encounters:  11/14/22 193 lb 6.4 oz (87.7 kg)  03/24/22 200 lb (90.7 kg)  10/07/21 177 lb 9.6 oz (80.6 kg)   GD:JMEQA were no vitals filed for this visit.,There is no height or weight on file to calculate BMI.  Constitutional:      Appearance: Healthy appearance. Not in distress.  Neck:     Vascular: JVD normal.  Pulmonary:     Effort: Pulmonary effort is normal.     Breath sounds: No wheezing. No rales. Diminished in the bases Cardiovascular:  Normal rate. Regular rhythm. Normal S1. Normal S2.      Murmurs: There is no murmur.  Edema:    Peripheral edema absent.  Abdominal:     Palpations: Abdomen is soft non tender. There is no hepatomegaly.  Skin:    General: Skin is warm and dry.  Neurological:     General: No focal deficit present.     Mental Status: Alert and oriented to person, place and time.     Cranial Nerves: Cranial nerves are intact.  EKG/LABS/Other Studies Reviewed    ECG personally reviewed by me today -none completed today    Lab Results  Component Value Date   WBC 6.8 11/14/2022   HGB 14.7 11/14/2022   HCT 47.1 11/14/2022   MCV 91.1 11/14/2022   PLT 200 11/14/2022   Lab Results  Component Value Date   CREATININE 0.84 11/14/2022   BUN 13 11/14/2022   NA 137 11/14/2022   K 3.6 11/14/2022   CL 102 11/14/2022   CO2 26 11/14/2022   No results found for: "ALT", "AST", "GGT", "ALKPHOS", "BILITOT" No results found for: "CHOL", "HDL", "LDLCALC", "LDLDIRECT", "TRIG", "CHOLHDL"  Lab Results  Component Value Date   HGBA1C 5.9 (H) 11/14/2022    Assessment & Plan    1.  Surgical clearance: -   Jeremy Day's  perioperative risk of a major cardiac event is 0.4% according to the Revised Cardiac Risk Index (RCRI).  Therefore, he is at low risk for perioperative complications.   His functional capacity is fair at 4.31 METs according to the Duke Activity Status Index (DASI). Recommendations: According to ACC/AHA guidelines, no further cardiovascular testing needed.  The patient may proceed to surgery at acceptable risk.   Antiplatelet and/or Anticoagulation Recommendations:   2.  Aortic root dilation: -Most recent 2D echo completed 10/2022 with mild dilation of the aortic root at 45 mm and mild dilation with slight increase in ascending aorta at 44 mm.  Continue blood pressure control and abstain from heavy lifting.  3.  History of PVCs: -Previously asymptomatic with bigeminal pattern. -Today patient reports no recurrence of palpitations since previous visit.  4.  GERD: Continue Pepcid 20 mg daily and as needed Tums  Disposition: Follow-up with Candee Furbish, MD or APP in as scheduled months    Medication Adjustments/Labs and Tests Ordered: Current medicines are reviewed at length with the patient today.  Concerns regarding medicines are outlined above.   Signed, Mable Fill, Marissa Nestle, NP 11/15/2022, 8:30 PM [facility]  Note:  This document was prepared using Dragon voice recognition software and may include unintentional dictation errors.

## 2022-11-15 NOTE — Telephone Encounter (Signed)
   [facility] Pre-operative Risk Assessment    Request for surgical clearance:  What type of surgery is being performed?  Bladder Tumor Resection   When is this surgery scheduled?  11/21/22   What type of clearance is required (medical clearance vs. Pharmacy clearance to hold med vs. Both)?  Medical   Are there any medications that need to be held prior to surgery and how long? No    Practice name and name of physician performing surgery?  [facility]  Dr. Link Snuffer    What is your office phone number? [phone] (ext: [phone])    7.   What is your office fax number? [phone]  8.   Anesthesia type (None, local, MAC, general) ?  General    Zara Council 11/15/2022, 12:20 PM  _________________________________________________________________   (provider comments below)

## 2022-11-16 ENCOUNTER — Encounter: Payer: Self-pay | Admitting: Nurse Practitioner

## 2022-11-16 ENCOUNTER — Ambulatory Visit: Payer: PPO | Attending: Nurse Practitioner | Admitting: Nurse Practitioner

## 2022-11-16 VITALS — BP 134/74 | HR 58 | Ht 72.0 in | Wt 196.0 lb

## 2022-11-16 DIAGNOSIS — I77810 Thoracic aortic ectasia: Secondary | ICD-10-CM | POA: Diagnosis not present

## 2022-11-16 DIAGNOSIS — Z01810 Encounter for preprocedural cardiovascular examination: Secondary | ICD-10-CM | POA: Diagnosis not present

## 2022-11-16 DIAGNOSIS — I493 Ventricular premature depolarization: Secondary | ICD-10-CM | POA: Diagnosis not present

## 2022-11-16 DIAGNOSIS — K2100 Gastro-esophageal reflux disease with esophagitis, without bleeding: Secondary | ICD-10-CM | POA: Diagnosis not present

## 2022-11-16 NOTE — Patient Instructions (Signed)
Medication Instructions:  Your physician recommends that you continue on your current medications as directed. Please refer to the Current Medication list given to you today. *If you need a refill on your cardiac medications before your next appointment, please call your pharmacy*   Lab Work: None Ordered   Testing/Procedures: None Ordered   Follow-Up: At [facility], you and your health needs are our priority.  As part of our continuing mission to provide you with exceptional heart care, we have created designated Provider Care Teams.  These Care Teams include your primary Cardiologist (physician) and Advanced Practice Providers (APPs -  Physician Assistants and Nurse Practitioners) who all work together to provide you with the care you need, when you need it.  We recommend signing up for the patient portal called "MyChart".  Sign up information is provided on this After Visit Summary.  MyChart is used to connect with patients for Virtual Visits (Telemedicine).  Patients are able to view lab/test results, encounter notes, upcoming appointments, etc.  Non-urgent messages can be sent to your provider as well.   To learn more about what you can do with MyChart, go to [email or web address].    Your next appointment:   November 2024   The format for your next appointment:   In Person  Provider:   Candee Furbish, MD     Other Instructions You are cleared for your procedure.  Important Information About Sugar

## 2022-11-16 NOTE — Anesthesia Preprocedure Evaluation (Addendum)
Anesthesia Evaluation  Patient identified by MRN, date of birth, ID band Patient awake    Reviewed: Allergy & Precautions, NPO status , Patient's Chart, lab work & pertinent test results  Airway Mallampati: II  TM Distance: >3 FB Neck ROM: Full    Dental  (+) Dental Advisory Given   Pulmonary former smoker   Pulmonary exam normal        Cardiovascular hypertension, Pt. on home beta blockers + Peripheral Vascular Disease   Rhythm:Regular Rate:Normal     Neuro/Psych negative neurological ROS     GI/Hepatic Neg liver ROS,GERD  Medicated,,  Endo/Other  negative endocrine ROS    Renal/GU negative Renal ROS     Musculoskeletal  (+) Arthritis ,    Abdominal   Peds  Hematology negative hematology ROS (+)   Anesthesia Other Findings   Reproductive/Obstetrics                             Anesthesia Physical Anesthesia Plan  ASA: 3  Anesthesia Plan: General   Post-op Pain Management: Tylenol PO (pre-op)*   Induction: Intravenous  PONV Risk Score and Plan: 2 and Dexamethasone, Ondansetron and Treatment may vary due to age or medical condition  Airway Management Planned: LMA  Additional Equipment: None  Intra-op Plan:   Post-operative Plan: Extubation in OR  Informed Consent: I have reviewed the patients History and Physical, chart, labs and discussed the procedure including the risks, benefits and alternatives for the proposed anesthesia with the patient or authorized representative who has indicated his/her understanding and acceptance.     Dental advisory given  Plan Discussed with:   Anesthesia Plan Comments: (See PAT note 11/14/2022)       Anesthesia Quick Evaluation

## 2022-11-21 ENCOUNTER — Other Ambulatory Visit: Payer: Self-pay

## 2022-11-21 ENCOUNTER — Encounter (HOSPITAL_COMMUNITY)
Admission: RE | Disposition: A | Discharge status: Home / Self Care | Payer: Self-pay | Source: Ambulatory Visit | Attending: Urology

## 2022-11-21 ENCOUNTER — Encounter (HOSPITAL_COMMUNITY): Payer: Self-pay | Admitting: Urology

## 2022-11-21 ENCOUNTER — Ambulatory Visit (HOSPITAL_COMMUNITY): Payer: PPO

## 2022-11-21 ENCOUNTER — Ambulatory Visit (HOSPITAL_COMMUNITY): Payer: PPO | Admitting: Physician Assistant

## 2022-11-21 ENCOUNTER — Ambulatory Visit (HOSPITAL_COMMUNITY)
Admission: RE | Admit: 2022-11-21 | Discharge: 2022-11-21 | Disposition: A | Discharge status: Home / Self Care | Payer: PPO | Source: Ambulatory Visit | Attending: Urology | Admitting: Urology

## 2022-11-21 ENCOUNTER — Ambulatory Visit (HOSPITAL_BASED_OUTPATIENT_CLINIC_OR_DEPARTMENT_OTHER): Payer: PPO | Admitting: Anesthesiology

## 2022-11-21 DIAGNOSIS — Z79899 Other long term (current) drug therapy: Secondary | ICD-10-CM | POA: Insufficient documentation

## 2022-11-21 DIAGNOSIS — D090 Carcinoma in situ of bladder: Secondary | ICD-10-CM | POA: Diagnosis not present

## 2022-11-21 DIAGNOSIS — I10 Essential (primary) hypertension: Secondary | ICD-10-CM | POA: Diagnosis not present

## 2022-11-21 DIAGNOSIS — C678 Malignant neoplasm of overlapping sites of bladder: Secondary | ICD-10-CM | POA: Diagnosis not present

## 2022-11-21 DIAGNOSIS — K219 Gastro-esophageal reflux disease without esophagitis: Secondary | ICD-10-CM | POA: Diagnosis not present

## 2022-11-21 DIAGNOSIS — I739 Peripheral vascular disease, unspecified: Secondary | ICD-10-CM | POA: Diagnosis not present

## 2022-11-21 DIAGNOSIS — Z87891 Personal history of nicotine dependence: Secondary | ICD-10-CM

## 2022-11-21 DIAGNOSIS — R7303 Prediabetes: Secondary | ICD-10-CM

## 2022-11-21 DIAGNOSIS — D494 Neoplasm of unspecified behavior of bladder: Secondary | ICD-10-CM

## 2022-11-21 HISTORY — PX: TRANSURETHRAL RESECTION OF BLADDER TUMOR WITH MITOMYCIN-C: SHX6459

## 2022-11-21 SURGERY — TRANSURETHRAL RESECTION OF BLADDER TUMOR WITH MITOMYCIN-C
Anesthesia: General | Laterality: Bilateral

## 2022-11-21 MED ORDER — LACTATED RINGERS IV SOLN: INTRAVENOUS | Status: DC

## 2022-11-21 MED ORDER — PROPOFOL 10 MG/ML IV BOLUS
INTRAVENOUS | Status: DC | PRN
  Administered 2022-11-21: 150 mg via INTRAVENOUS
  Administered 2022-11-21: 50 mg via INTRAVENOUS

## 2022-11-21 MED ORDER — ONDANSETRON HCL 4 MG/2ML IJ SOLN
INTRAMUSCULAR | Status: DC | PRN
  Administered 2022-11-21: 4 mg via INTRAVENOUS

## 2022-11-21 MED ORDER — LIDOCAINE HCL (CARDIAC) PF 100 MG/5ML IV SOSY
PREFILLED_SYRINGE | INTRAVENOUS | Status: DC | PRN
  Administered 2022-11-21: 60 mg via INTRAVENOUS

## 2022-11-21 MED ORDER — AMISULPRIDE (ANTIEMETIC) 5 MG/2ML IV SOLN: 10.0000 mg | Freq: Once | INTRAVENOUS | Status: DC | PRN

## 2022-11-21 MED ORDER — GEMCITABINE CHEMO FOR BLADDER INSTILLATION 2000 MG
2000.0000 mg | Freq: Once | INTRAVENOUS | Status: AC
  Administered 2022-11-21: 2000 mg via INTRAVESICAL
  Filled 2022-11-21: qty 2000

## 2022-11-21 MED ORDER — SODIUM CHLORIDE 0.9 % IR SOLN
Status: DC | PRN
  Administered 2022-11-21: 3000 mL

## 2022-11-21 MED ORDER — CEFAZOLIN SODIUM-DEXTROSE 2-4 GM/100ML-% IV SOLN
2.0000 g | INTRAVENOUS | Status: AC
  Administered 2022-11-21: 2 g via INTRAVENOUS
  Filled 2022-11-21: qty 100

## 2022-11-21 MED ORDER — SUCCINYLCHOLINE CHLORIDE 200 MG/10ML IV SOSY
PREFILLED_SYRINGE | INTRAVENOUS | Status: AC
  Filled 2022-11-21: qty 10

## 2022-11-21 MED ORDER — MIDAZOLAM HCL 2 MG/2ML IJ SOLN
INTRAMUSCULAR | Status: AC
  Filled 2022-11-21: qty 2

## 2022-11-21 MED ORDER — STERILE WATER FOR IRRIGATION IR SOLN
Status: DC | PRN
  Administered 2022-11-21: 500 mL

## 2022-11-21 MED ORDER — ACETAMINOPHEN 500 MG PO TABS
1000.0000 mg | ORAL_TABLET | Freq: Once | ORAL | Status: AC
  Administered 2022-11-21: 1000 mg via ORAL
  Filled 2022-11-21: qty 2

## 2022-11-21 MED ORDER — LIDOCAINE HCL (PF) 2 % IJ SOLN
INTRAMUSCULAR | Status: AC
  Filled 2022-11-21: qty 5

## 2022-11-21 MED ORDER — CHLORHEXIDINE GLUCONATE 0.12 % MT SOLN
15.0000 mL | Freq: Once | OROMUCOSAL | Status: AC
  Administered 2022-11-21: 15 mL via OROMUCOSAL

## 2022-11-21 MED ORDER — 0.9 % SODIUM CHLORIDE (POUR BTL) OPTIME
TOPICAL | Status: DC | PRN
  Administered 2022-11-21: 1000 mL

## 2022-11-21 MED ORDER — GEMCITABINE CHEMO FOR BLADDER INSTILLATION 2000 MG: 2000.0000 mg | Freq: Once | INTRAVENOUS | Status: DC

## 2022-11-21 MED ORDER — FENTANYL CITRATE PF 50 MCG/ML IJ SOSY: 25.0000 ug | PREFILLED_SYRINGE | INTRAMUSCULAR | Status: DC | PRN

## 2022-11-21 MED ORDER — HYDROCODONE-ACETAMINOPHEN 5-325 MG PO TABS: 1.0000 | ORAL_TABLET | Freq: Four times a day (QID) | ORAL | 0 refills | Status: DC | PRN

## 2022-11-21 MED ORDER — MIDAZOLAM HCL 5 MG/5ML IJ SOLN
INTRAMUSCULAR | Status: DC | PRN
  Administered 2022-11-21: 1 mg via INTRAVENOUS

## 2022-11-21 MED ORDER — METOPROLOL SUCCINATE ER 25 MG PO TB24
25.0000 mg | ORAL_TABLET | Freq: Once | ORAL | Status: DC
  Filled 2022-11-21: qty 1

## 2022-11-21 MED ORDER — DEXAMETHASONE SODIUM PHOSPHATE 10 MG/ML IJ SOLN
INTRAMUSCULAR | Status: AC
  Filled 2022-11-21: qty 1

## 2022-11-21 MED ORDER — FENTANYL CITRATE (PF) 100 MCG/2ML IJ SOLN
INTRAMUSCULAR | Status: DC | PRN
  Administered 2022-11-21: 50 ug via INTRAVENOUS

## 2022-11-21 MED ORDER — PROPOFOL 10 MG/ML IV BOLUS
INTRAVENOUS | Status: AC
  Filled 2022-11-21: qty 20

## 2022-11-21 MED ORDER — ONDANSETRON HCL 4 MG/2ML IJ SOLN
INTRAMUSCULAR | Status: AC
  Filled 2022-11-21: qty 2

## 2022-11-21 MED ORDER — METOPROLOL SUCCINATE ER 25 MG PO TB24
12.5000 mg | ORAL_TABLET | Freq: Every day | ORAL | Status: DC
  Administered 2022-11-21: 12.5 mg via ORAL

## 2022-11-21 MED ORDER — FENTANYL CITRATE (PF) 100 MCG/2ML IJ SOLN
INTRAMUSCULAR | Status: AC
  Filled 2022-11-21: qty 2

## 2022-11-21 MED ORDER — IOHEXOL 300 MG/ML  SOLN
INTRAMUSCULAR | Status: DC | PRN
  Administered 2022-11-21: 30 mL

## 2022-11-21 MED ORDER — SUCCINYLCHOLINE CHLORIDE 200 MG/10ML IV SOSY
PREFILLED_SYRINGE | INTRAVENOUS | Status: DC | PRN
  Administered 2022-11-21 (×2): 40 mg via INTRAVENOUS

## 2022-11-21 MED ORDER — DEXAMETHASONE SODIUM PHOSPHATE 10 MG/ML IJ SOLN
INTRAMUSCULAR | Status: DC | PRN
  Administered 2022-11-21: 4 mg via INTRAVENOUS

## 2022-11-21 MED ORDER — ORAL CARE MOUTH RINSE: 15.0000 mL | Freq: Once | OROMUCOSAL | Status: AC

## 2022-11-21 SURGICAL SUPPLY — 21 items
BAG DRN RND TRDRP ANRFLXCHMBR (UROLOGICAL SUPPLIES) ×1
BAG URINE DRAIN 2000ML AR STRL (UROLOGICAL SUPPLIES) IMPLANT
BAG URO CATCHER STRL LF (MISCELLANEOUS) ×2 IMPLANT
CATH FOLEY 2WAY SLVR  5CC 18FR (CATHETERS) ×1
CATH FOLEY 2WAY SLVR 5CC 18FR (CATHETERS) IMPLANT
CATH URETL OPEN END 6FR 70 (CATHETERS) IMPLANT
DRAPE FOOT SWITCH (DRAPES) ×2 IMPLANT
ELECT REM PT RETURN 15FT ADLT (MISCELLANEOUS) ×2 IMPLANT
GLOVE BIO SURGEON STRL SZ7.5 (GLOVE) ×2 IMPLANT
GOWN STRL REUS W/ TWL XL LVL3 (GOWN DISPOSABLE) ×2 IMPLANT
GOWN STRL REUS W/TWL XL LVL3 (GOWN DISPOSABLE) ×1
KIT TURNOVER KIT A (KITS) IMPLANT
LOOP CUT BIPOLAR 24F LRG (ELECTROSURGICAL) IMPLANT
MANIFOLD NEPTUNE II (INSTRUMENTS) ×2 IMPLANT
PACK CYSTO (CUSTOM PROCEDURE TRAY) ×2 IMPLANT
PLUG CATH AND CAP STER (CATHETERS) IMPLANT
SYR 10ML LL (SYRINGE) IMPLANT
SYR TOOMEY IRRIG 70ML (MISCELLANEOUS) ×1
SYRINGE TOOMEY IRRIG 70ML (MISCELLANEOUS) IMPLANT
TUBING CONNECTING 10 (TUBING) ×2 IMPLANT
TUBING UROLOGY SET (TUBING) ×2 IMPLANT

## 2022-11-21 NOTE — H&P (Signed)
CC/HPI: CC: Bladder cancer  HPI:  01/24/2019  72 year old male presents with a primary complaint of urgency to urinate with low volume output. He also complains about mildly weak stream, nocturia 3 in difficulty postponing urination. AUA symptom score is 15. He denies a history of urinary tract infections except for once in the 1970s. He denies current hematuria or dysuria. Incidentally, he notes a 2 year history of intermittent gross hematuria. Most recently, the gross hematuria symptoms have increased and he had 3 episodes of gross hematuria last week. He has never had this worked up. He is asymptomatic in this regard. He started Flomax about 2 weeks ago and has not noticed much of a difference.   02/01/2019  Patient underwent a CT IVP. This revealed multiple filling defects in his bladder consistent with likely tumors. Kidney and ureter were normal. PSA at the last visit was normal.   02/11/19: s/p 1st TURBT on 02/06/19. 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 He tolerated well and returns today for pathology review and TOV. Urine has been clear. He has no complaints today. In the operating room, he was found have a large amount of tumor. It was not completely resectable with one surgery.   02/26/2019  Patient is status post his second TURBT on 02/20/2019. 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 He has been doing well with the catheter. Urine is cleared up. He greatly desires to avoid cystectomy. He does understand that he has a large amount of tumor involvement and after 2 resections, I was not able to remove all the tumor. However, given no muscle involvement. He desires for me to try full resection endoscopically. 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   03/19/2019  Patient status post his third TURBT. He tolerated this well. Hematuria is resolving. 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 Muscle  was present in the specimen. All tumor was resected. After this third TURBT.   08/23/2019  Patient completed BCG. He presents for cystoscopy. He denies interval gross hematuria.   10/02/2019  Patient is status post TURBT. This revealed superficial low-grade urothelial cell carcinoma. No evidence of high-grade cancer. He has had no further gross hematuria. No pain.   02/27/2020  Patient completed repeat induction BCG. He presents for cystoscopy. He does also complain about significant urinary frequency and urgency as well as nocturia 2-4 times a night. He is on tamsulosin.   05/27/2020  Patient presents for surveillance cystoscopy. He completed his maintenance BCG. His urinary urgency and frequency have improved. Nocturia is intermittent. It depends on the amount that he drinks. He is not taking Myrbetriq. It did not really help. He underwent a CT of the abdomen and pelvis, CT IVP. This revealed no evidence of upper tract malignancy.   07/02/2020  Patient underwent TURBT --small with instillation of gemcitabine. Pathology revealed early noninvasive low-grade urothelial cell carcinoma. He has no complaints today.   11/27/2020:  S/p TURBT 11/20/2020 with benign urothelium and submucosa with dystrophic calcification and reactive giant cells. NED.   06/25/2021  Patient had repeat upper tract imaging that revealed no evidence of genitourinary malignancy. He presents for surveillance cystoscopy.   10/14/2021  Patient presents for surveillance cystoscopy after undergoing a cystoscopy with office fulguration a couple months ago.   12/31/2021  Patient presents today for destruction of bladder tumor 0.5 to 2 cm.   04/01/2022  Patient status post destruction of bladder tumors in the office. Presents for surveillance cystoscopy. Completed  maintenance BCG x3.   08/04/2022  Patient completed repeat maintenance BCG x3. Presents for surveillance cystoscopy. Does complain about some urinary complaints  including frequency, urgency, weak stream. Has taken tamsulosin in the past but did not think that helped very much.   11/03/2022  Patient presents today for surveillance cystoscopy. No interval hematuria or dysuria.     ALLERGIES: No Allergies    MEDICATIONS: Tamsulosin Hcl 0.4 mg capsule 1 capsule PO Daily  Anti-Depresssant  Calcium Carbonate  Clobetasol Propionate PRN  Diphenhydramine Hcl 25 mg capsule capsule PRN  Famotidine 20 mg tablet  Mometasone Furoate PRN  Oxycodone-Acetaminophen PRN  Pimecrolimus 1 % cream PRN     GU PSH: Bladder Instill AntiCA Agent - 05/11/2022, 05/03/2022, 04/26/2022, 01/25/2022, 01/18/2022, 01/11/2022, 2022, 2021, 2021, 2021, 2021, 2021, 2021, 2021, 2020, 2020, 2020, 2020, 2020, 2020, 2020, 2020, 2020, 2020 Cystoscopy - 08/04/2022, 10/14/2021, 06/25/2021, 2022, 2021, 2021, 2021, 2020, 2020 Cystoscopy TURBT <2 cm - 12/31/2021, 06/28/2021, 2022, 2021 Cystoscopy TURBT >5 cm - 2020, 2020, 2020 Cystoscopy TURBT 2-5 cm - 2020 Locm 300-'399Mg'$ /Ml Iodine,1Ml - 06/22/2021, 2021, 2020       [facility] Notes: spinal fusion 09-05-2021   NON-GU PSH: No Non-GU PSH    GU PMH: Bladder Cancer overlapping sites - 08/04/2022, - 05/11/2022, - 05/03/2022, - 04/26/2022, - 04/01/2022, - 01/25/2022, - 01/18/2022, - 01/11/2022, - 12/31/2021, - 10/14/2021, - 06/28/2021, - 06/25/2021, - 06/22/2021, - 2022, - 2022, - 2021, HGT1 bladder cancer Catheter removed today We discussed proceeding with repeat TURBT for confirmatory staging and residual tumor resection. , - 2020 BPH w/LUTS - 08/04/2022, - 06/25/2021, - 2021, - 2020 Nocturia - 08/04/2022, - 06/25/2021 (Stable), - 2021, - 2020 Urinary Frequency - 08/04/2022, - 2021 Urinary Urgency - 08/04/2022, - 06/25/2021, - 2020 Weak Urinary Stream - 08/04/2022, - 2020 Bladder tumor/neoplasm - 2020 Encounter for Prostate Cancer screening - 2020 Gross hematuria - 2020    NON-GU PMH: No Non-GU PMH    FAMILY HISTORY: No Family History    SOCIAL HISTORY: Marital Status:  Married    REVIEW OF SYSTEMS:    GU Review Male:   Patient denies get up at night to urinate, frequent urination, penile pain, burning/ pain with urination, have to strain to urinate , stream starts and stops, trouble starting your stream, leakage of urine, erection problems, and hard to postpone urination.  Gastrointestinal (Upper):   Patient denies nausea, vomiting, and indigestion/ heartburn.  Gastrointestinal (Lower):   Patient denies diarrhea and constipation.  Constitutional:   Patient denies fever, night sweats, weight loss, and fatigue.  Skin:   Patient denies skin rash/ lesion and itching.  Eyes:   Patient denies blurred vision and double vision.  Ears/ Nose/ Throat:   Patient denies sore throat and sinus problems.  Hematologic/Lymphatic:   Patient denies swollen glands and easy bruising.  Cardiovascular:   Patient denies leg swelling and chest pains.  Respiratory:   Patient denies cough and shortness of breath.  Endocrine:   Patient denies excessive thirst.  Musculoskeletal:   Patient denies back pain and joint pain.  Neurological:   Patient denies headaches and dizziness.  Psychologic:   Patient denies depression and anxiety.   VITAL SIGNS: None   GU PHYSICAL EXAMINATION:    Penis: Circumcised, no warts, no cracks. No dorsal Peyronie's plaques, no left corporal Peyronie's plaques, no right corporal Peyronie's plaques, no scarring, no warts. No balanitis, no meatal stenosis.   MULTI-SYSTEM PHYSICAL EXAMINATION:    Constitutional: Well-nourished. No physical deformities.  Normally developed. Good grooming.  Respiratory: No labored breathing, no use of accessory muscles.   Gastrointestinal: No mass, no tenderness, no rigidity, non obese abdomen.     Complexity of Data:  Source Of History:  Patient  Records Review:   Previous Doctor Records, Previous Patient Records  Urine Test Review:   Urinalysis   01/24/19  PSA  Total PSA 2.93 ng/mL    PROCEDURES:         Flexible  Cystoscopy - 52000  Risks, benefits, and some of the potential complications of the procedure were discussed at length with the patient including infection, bleeding, voiding discomfort, urinary retention, fever, chills, sepsis, and others. All questions were answered. Informed consent was obtained. Antibiotic prophylaxis was given. Sterile technique and intraurethral analgesia were used.  Meatus:  Normal size. Normal location. Normal condition.  Urethra:  No strictures.  External Sphincter:  Normal.  Verumontanum:  Normal.  Prostate:  Obstructing. Moderate hyperplasia.  Bladder Neck:  Non-obstructing.  Ureteral Orifices:  Normal location. Normal size. Normal shape.   Bladder:  No trabeculation. Very superficial papillary appearing mucosa on the anterior bladder wall closer to the bladder neck, approximately 3 to 4 cm length of expansion. Also 1 cm lesion on the left lateral wall towards the dome      The lower urinary tract was carefully examined. The procedure was well-tolerated and without complications. Antibiotic instructions were given. Instructions were given to call the office immediately for bloody urine, difficulty urinating, urinary retention, painful or frequent urination, fever, chills, nausea, vomiting or other illness. The patient stated that he understood these instructions and would comply with them.         Urinalysis Dipstick Dipstick Cont'd  Color: Yellow Bilirubin: Neg mg/dL  Appearance: Clear Ketones: Neg mg/dL  Specific Gravity: 1.020 Blood: Neg ery/uL  pH: 6.0 Protein: Neg mg/dL  Glucose: Neg mg/dL Urobilinogen: 0.2 mg/dL    Nitrites: Neg    Leukocyte Esterase: Neg leu/uL    ASSESSMENT:      ICD-10 Details  1 GU:   Bladder Cancer overlapping sites - C67.8 Chronic, Worsening   PLAN:           Document Letter(s):  Created for Patient: Clinical Summary         Notes:   Recommend cystoscopy with bilateral retrograde pyelogram with TURBT with intravesical  instillation of gemcitabine.   Would likely need referral to oncology given his repeated recurrences despite BCG.   CC: Dr. [name]        Next Appointment:      Next Appointment: 11/15/2022 08:00 AM    Appointment Type: BCG Treatment    Location: [facility] [phone]    Provider: NVALL NVALL    Reason for Visit: 1 of 3 BCG      Signed by Link Snuffer, III, M.D. on 11/03/22 at 9:26 AM (EST

## 2022-11-21 NOTE — Op Note (Signed)
Operative Note  Preoperative diagnosis:  1.  Bladder tumor  Postoperative diagnosis: 1.  Bladder tumor--medium  Procedure(s): 1.  Transurethral resection of bladder tumor--medium 2.  Bilateral retrograde pyelogram 3.  Intravesical instillation of gemcitabine  Surgeon: Link Snuffer, MD  Assistants: None  Anesthesia: General  Complications: None immediate  EBL: Minimal  Specimens: 1.  Bladder tumor  Drains/Catheters: 1.  Foley catheter  Intraoperative findings: 1.  Normal anterior urethra 2.  Borderline obstructing prostate 3.  Bilateral ureteral orifices in normal position.  Evidence of prior resection around the left ureteral orifice.  There was an approximately 2.5 cm area of very superficial appearing slightly papillary mucosa on the anterior bladder wall close to the bladder neck that was completely resected and fulgurated.  There was another separate 1 cm area of slightly raised papillary mucosa on the anterior bladder wall closer to the dome completely resected/fulgurated.  Area of erythema about 2 cm on the right anterior lateral wall towards the dome it was fulgurated 4.  Bilateral retrograde pyelogram without any filling defect or obvious hydronephrosis.  Indication: 72 year old male with a history of bladder cancer status post multiple transurethral resections presents for the previously mentioned operation.  Description of procedure:  The patient was identified and consent was obtained.  The patient was taken to the operating room and placed in the supine position.  The patient was placed under general anesthesia.  Perioperative antibiotics were administered.  The patient was placed in dorsal lithotomy.  Patient was prepped and draped in a standard sterile fashion and a timeout was performed.  A 21 French rigid cystoscope was advanced into the urethra and into the bladder.  Complete cystoscopy was performed with findings noted above.  The left ureteral orifice was  intubated with an open-ended ureteral catheter and a retrograde pyelogram was performed with no abnormal findings.  The same was performed on the right.  There was some air bubbles from the retrograde but there was no obvious fixed filling defect.  I removed the scope and advanced a 29 French resectoscope with a visual obturator and placed into the urethra and into the bladder.  I exchanged for the bipolar working element.  On bipolar settings, I resected the tumors of interest and fulgurated the resection beds as well as the surrounding papillary mucosa.  All tumor.  Superficial.  Specimen was collected.  I reinspected bladder mucosa and there was no evidence of any bleeding.  No perforation.  All tumor was treated.  Scope withdrawn and Foley catheter placed.  This concluded the operation.  Patient tolerated the procedure well and was stable postoperatively.  In the PACU, intravesical instillation of gemcitabine was performed.  This remained for approximate 1 hour followed by proper disposal.  Plan: Follow-up in 1 week for pathology review

## 2022-11-21 NOTE — Transfer of Care (Signed)
Immediate Anesthesia Transfer of Care Note  Patient: Jeremy Day  Procedure(s) Performed: CYSTOSCOPY BILATERAL RETROGRADE PYELOGRAM TRANSURETHRAL RESECTION OF BLADDER TUMOR WITH GEMCITABINE (Bilateral)  Patient Location: PACU  Anesthesia Type:General  Level of Consciousness: awake, alert , oriented, and patient cooperative  Airway & Oxygen Therapy: Patient Spontanous Breathing and Patient connected to nasal cannula oxygen  Post-op Assessment: Report given to RN, Post -op Vital signs reviewed and stable, and Patient moving all extremities X 4  Post vital signs: Reviewed and stable  Last Vitals:  Vitals Value Taken Time  BP 118/84 11/21/22 0941  Temp    Pulse 54 11/21/22 0942  Resp 10 11/21/22 0942  SpO2 98 % 11/21/22 0942  Vitals shown include unvalidated device data.  Last Pain:  Vitals:   11/21/22 0717  TempSrc: Oral         Complications: No notable events documented.

## 2022-11-21 NOTE — Discharge Instructions (Signed)

## 2022-11-21 NOTE — Anesthesia Procedure Notes (Signed)
Procedure Name: LMA Insertion Date/Time: 11/21/2022 8:49 AM  Performed by: Jonna Munro, CRNAPre-anesthesia Checklist: Patient identified, Emergency Drugs available, Suction available, Patient being monitored and Timeout performed Patient Re-evaluated:Patient Re-evaluated prior to induction Oxygen Delivery Method: Circle system utilized Preoxygenation: Pre-oxygenation with 100% oxygen Induction Type: IV induction LMA: LMA inserted LMA Size: 4.0 Number of attempts: 1 Placement Confirmation: positive ETCO2, breath sounds checked- equal and bilateral and CO2 detector Tube secured with: Tape Dental Injury: Teeth and Oropharynx as per pre-operative assessment

## 2022-11-22 ENCOUNTER — Encounter (HOSPITAL_COMMUNITY): Payer: Self-pay | Admitting: Urology

## 2022-11-22 LAB — SURGICAL PATHOLOGY

## 2022-11-22 NOTE — Anesthesia Postprocedure Evaluation (Signed)
Anesthesia Post Note  Patient: Jeremy Day  Procedure(s) Performed: CYSTOSCOPY BILATERAL RETROGRADE PYELOGRAM TRANSURETHRAL RESECTION OF BLADDER TUMOR WITH GEMCITABINE (Bilateral)     Patient location during evaluation: PACU Anesthesia Type: General Level of consciousness: awake and alert Pain management: pain level controlled Vital Signs Assessment: post-procedure vital signs reviewed and stable Respiratory status: spontaneous breathing, nonlabored ventilation, respiratory function stable and patient connected to nasal cannula oxygen Cardiovascular status: blood pressure returned to baseline and stable Postop Assessment: no apparent nausea or vomiting Anesthetic complications: no   No notable events documented.  Last Vitals:  Vitals:   11/21/22 1222 11/21/22 1230  BP: (!) 147/90 127/86  Pulse: (!) 55 (!) 50  Resp: 20 20  Temp: 36.5 C   SpO2: 98% 98%    Last Pain:  Vitals:   11/21/22 1230  TempSrc:   PainSc: 0-No pain                 Tiajuana Amass

## 2022-11-25 DIAGNOSIS — T84216A Breakdown (mechanical) of internal fixation device of vertebrae, initial encounter: Secondary | ICD-10-CM | POA: Diagnosis not present

## 2022-11-25 DIAGNOSIS — M4316 Spondylolisthesis, lumbar region: Secondary | ICD-10-CM | POA: Diagnosis not present

## 2022-11-25 DIAGNOSIS — Z4789 Encounter for other orthopedic aftercare: Secondary | ICD-10-CM | POA: Diagnosis not present

## 2022-11-25 DIAGNOSIS — M419 Scoliosis, unspecified: Secondary | ICD-10-CM | POA: Diagnosis not present

## 2022-11-25 DIAGNOSIS — Z981 Arthrodesis status: Secondary | ICD-10-CM | POA: Diagnosis not present

## 2022-11-30 DIAGNOSIS — C678 Malignant neoplasm of overlapping sites of bladder: Secondary | ICD-10-CM | POA: Diagnosis not present

## 2022-12-12 ENCOUNTER — Other Ambulatory Visit: Payer: Self-pay | Admitting: Family

## 2022-12-12 NOTE — Telephone Encounter (Signed)
Patient of Dr. Marlou Porch. Please review for refill. Thank you!

## 2022-12-28 ENCOUNTER — Telehealth: Payer: Self-pay | Admitting: Hematology

## 2022-12-28 NOTE — Telephone Encounter (Signed)
Scheduled appt per 2/21 referral. Pt is aware of appt date and time. Pt is aware to arrive 15 mins prior to appt time and to bring and updated insurance card. Pt is aware of appt location.   ?

## 2023-01-13 ENCOUNTER — Encounter: Payer: Self-pay | Admitting: Hematology

## 2023-01-13 ENCOUNTER — Inpatient Hospital Stay: Payer: PPO | Attending: Hematology | Admitting: Hematology

## 2023-01-13 ENCOUNTER — Inpatient Hospital Stay: Payer: PPO

## 2023-01-13 VITALS — BP 124/75 | HR 59 | Temp 98.0°F | Resp 17 | Ht 72.0 in | Wt 200.8 lb

## 2023-01-13 DIAGNOSIS — C678 Malignant neoplasm of overlapping sites of bladder: Secondary | ICD-10-CM

## 2023-01-13 DIAGNOSIS — Z79899 Other long term (current) drug therapy: Secondary | ICD-10-CM | POA: Insufficient documentation

## 2023-01-13 DIAGNOSIS — Z5111 Encounter for antineoplastic chemotherapy: Secondary | ICD-10-CM | POA: Insufficient documentation

## 2023-01-13 LAB — CBC WITH DIFFERENTIAL/PLATELET
Abs Immature Granulocytes: 0.06 10*3/uL (ref 0.00–0.07)
Basophils Absolute: 0.1 10*3/uL (ref 0.0–0.1)
Basophils Relative: 1 %
Eosinophils Absolute: 0.3 10*3/uL (ref 0.0–0.5)
Eosinophils Relative: 3 %
HCT: 44.2 % (ref 39.0–52.0)
Hemoglobin: 14.5 g/dL (ref 13.0–17.0)
Immature Granulocytes: 1 %
Lymphocytes Relative: 22 %
Lymphs Abs: 1.7 10*3/uL (ref 0.7–4.0)
MCH: 30 pg (ref 26.0–34.0)
MCHC: 32.8 g/dL (ref 30.0–36.0)
MCV: 91.3 fL (ref 80.0–100.0)
Monocytes Absolute: 1.1 10*3/uL — ABNORMAL HIGH (ref 0.1–1.0)
Monocytes Relative: 14 %
Neutro Abs: 4.7 10*3/uL (ref 1.7–7.7)
Neutrophils Relative %: 59 %
Platelets: 223 10*3/uL (ref 150–400)
RBC: 4.84 MIL/uL (ref 4.22–5.81)
RDW: 16.5 % — ABNORMAL HIGH (ref 11.5–15.5)
WBC: 7.9 10*3/uL (ref 4.0–10.5)
nRBC: 0 % (ref 0.0–0.2)

## 2023-01-13 LAB — COMPREHENSIVE METABOLIC PANEL
ALT: 12 U/L (ref 0–44)
AST: 17 U/L (ref 15–41)
Albumin: 4.1 g/dL (ref 3.5–5.0)
Alkaline Phosphatase: 87 U/L (ref 38–126)
Anion gap: 5 (ref 5–15)
BUN: 11 mg/dL (ref 8–23)
CO2: 30 mmol/L (ref 22–32)
Calcium: 9.5 mg/dL (ref 8.9–10.3)
Chloride: 103 mmol/L (ref 98–111)
Creatinine, Ser: 0.91 mg/dL (ref 0.61–1.24)
GFR, Estimated: >60 mL/min (ref >60)
Glucose, Bld: 98 mg/dL (ref 70–99)
Potassium: 4.6 mmol/L (ref 3.5–5.1)
Sodium: 138 mmol/L (ref 135–145)
Total Bilirubin: 0.5 mg/dL (ref 0.3–1.2)
Total Protein: 7.2 g/dL (ref 6.5–8.1)

## 2023-01-13 MED ORDER — OXYBUTYNIN CHLORIDE ER 10 MG PO TB24: 10.0000 mg | ORAL_TABLET | ORAL | 0 refills | Status: DC

## 2023-01-13 NOTE — Progress Notes (Signed)
[place]   [phone] [phone]) [place] Note   Patient Care Team: [name], MD as PCP - General (Family Medicine) Jerline Pain, MD as PCP - Cardiology (Cardiology) 01/13/2023  CHIEF COMPLAINTS/PURPOSE OF CONSULTATION:  Recurrent bladder cancer  Referring physician:  urologist Dr. Gloriann Loan  HISTORY OF PRESENTING ILLNESS:  Jeremy Day 72 y.o. male is here because of recurrent nonmuscular invasive bladder cancer.  He was referred by his urologist Dr. Gloriann Loan.  He presented to the clinic by himself.  He was initially diagnosed in March 2022, he presented with urinary urgency and low volume urine output.  He underwent 3 TURBT from April to May of 2022, pathology showed high-grade T1 bladder cancer, he tolerated the procedures very well, he did have large amount of tumor involvement, Dr. Gloriann Loan was able to remove all visible disease.  He completed BCG maintenance therapy in July 2021.  He had a recurrence in August 2021, underwent TURBT again with small amount of tumor, noninvasive low-grade urothelial cell carcinoma.  He received intravesical gemcitabine with TURBT.  He has been followed by Dr. Gloriann Loan with close monitoring.  He had recurrence again in February 2023, and had TURBT with multiple tumors 0.5 to 2 cm.  He again completed maintenance BCG x 3.  On November 30, 2022, he underwent TURBT with instillation of gemcitabine again, pathology reviewed noninvasive low-grade papillary urothelial carcinoma.  Patient has been doing well in general.  Denies any hematuria or dysuria.  He does have urinary frequency at night, for 4-6 times a night.   He otherwise feels well, denies any other systemic symptoms.  No recent weight loss.  MEDICAL HISTORY:  Past Medical History:  Diagnosis Date   Abnormal tandem walk    using 2 canes due to lower back arthritis   Ascending aorta dilatation (HCC)    echo  04-11-2016  11m and dilated aortc root 334m   Bladder tumor    BPH (benign prostatic hyperplasia)    [place]   Depression    Diverticulosis    Full dentures    GERD (gastroesophageal reflux disease)    Hiatal hernia    History of colon polyps    History of staphylococcal infection 2008   right ankle  w/ sepsis   Meningitis spinal age 39 3r 6   OA (osteoarthritis)    lower back, right ankle,, bilateral knees and shouldres   Pneumonia yrs ago   Pre-diabetes    Psoriasis    PVC's (premature ventricular contractions)    2012-- hx bigeminy/ trigeminy with near syncope (cardiology consult note in epic dated 2012)  (last cardiology visit in epic w/ dr skMarlou Porch05-21-2017)   Seasonal allergies    Urgency of urination     SURGICAL HISTORY: Past Surgical History:  Procedure Laterality Date   ANKLE ARTHROSCOPY Right 02-13-2007    dr grBerenice Primas w/ debridement and removal loose body   [place]   INGUINAL LYMPH NODE BIOPSY  2001  approx.   benign   SPINAL FUSION     "T8 through sacral area".   TONSILLECTOMY     TRANSURETHRAL RESECTION OF BLADDER TUMOR N/A 02/06/2019   Procedure: TRANSURETHRAL RESECTION OF BLADDER TUMOR (TURBT);  Surgeon: BeLucas MallowMD;  [facility];  Service: Urology;  Laterality: N/A;   TRANSURETHRAL RESECTION OF BLADDER TUMOR  N/A 02/20/2019   Procedure: TRANSURETHRAL RESECTION OF BLADDER TUMOR (TURBT);  Surgeon: Lucas Mallow, MD;  [facility];  Service: Urology;  Laterality: N/A;   TRANSURETHRAL RESECTION OF BLADDER TUMOR N/A 03/13/2019   Procedure: TRANSURETHRAL RESECTION OF BLADDER TUMOR (TURBT);  Surgeon: Lucas Mallow, MD;  [facility];  Service: Urology;  Laterality: N/A;   TRANSURETHRAL RESECTION OF BLADDER TUMOR N/A 11/20/2020   Procedure: TRANSURETHRAL RESECTION OF BLADDER TUMOR (TURBT) WITH INSTILLATION OF post-op GEMCITABINE;  Surgeon: Janith Lima, MD;  Location: [facility];  Service: Urology;  Laterality: N/A;   TRANSURETHRAL RESECTION OF BLADDER TUMOR WITH MITOMYCIN-C N/A 09/23/2019   Procedure: TRANSURETHRAL RESECTION OF BLADDER TUMOR WITH GEMCITABINE INSTILLATION INTO THE BLADDER;  Surgeon: Lucas Mallow, MD;  Location: [facility];  Service: Urology;  Laterality: N/A;   TRANSURETHRAL RESECTION OF BLADDER TUMOR WITH MITOMYCIN-C N/A 06/24/2020   Procedure: TRANSURETHRAL RESECTION OF BLADDER TUMOR WITH POST OPERATIVE GEMCITABINE INSTILLATION;  Surgeon: Lucas Mallow, MD;  Location: [place];  Service: Urology;  Laterality: N/A;   TRANSURETHRAL RESECTION OF BLADDER TUMOR WITH MITOMYCIN-C Bilateral 11/21/2022   Procedure: CYSTOSCOPY BILATERAL RETROGRADE PYELOGRAM TRANSURETHRAL RESECTION OF BLADDER TUMOR WITH GEMCITABINE;  Surgeon: Lucas Mallow, MD;  [facility];  Service: Urology;  Laterality: Bilateral;  1 HR FOR CASE    SOCIAL HISTORY: Social History   Socioeconomic History   Marital status: Married    Spouse name: Not on file   Number of children: 0   Years of education: Not on file   Highest education level: Not on file  Occupational History   Not on file  Tobacco Use   Smoking status: Former    Packs/day: 2.00    Years: 20.00    Total pack years: 40.00    Types: Cigarettes    Quit date: 02/04/1985    Years since quitting: 37.9   Smokeless tobacco: Former    Types: Snuff, Chew    Quit date: 02/04/1997  Vaping Use   Vaping Use: Never used  Substance and Sexual Activity   Alcohol use: No   Drug use: Not Currently    Comment: per pt "weed" in the 1970s none since   Sexual activity: Not on file  Other Topics Concern   Not on file  Social History Narrative   Not on file   Social Determinants of Health   Financial Resource Strain: Not on file  Food Insecurity: Not on file  Transportation Needs: Not on file  Physical Activity: Not on file  Stress: Not on file  Social Connections:  Not on file  Intimate Partner Violence: Not on file    FAMILY HISTORY: Family History  Problem Relation Age of Onset   Cancer Mother        skin cancer   Cancer Father        skin cancer   Other Father        low potassium     ALLERGIES:  has No Known Allergies.  MEDICATIONS:  Current Outpatient Medications  Medication Sig Dispense Refill   oxybutynin (DITROPAN-XL) 10 MG 24 hr tablet Take 1 tablet (10 mg total) by mouth once a week. Take 1-2 hours before each chemotherapy 10 tablet 0   acetaminophen (TYLENOL) 500 MG tablet Take 1,000 mg by mouth every 6 (six) hours as needed for moderate pain.     calcium carbonate (TUMS - DOSED IN MG  ELEMENTAL CALCIUM) 500 MG chewable tablet Chew 1 tablet by mouth daily as needed for indigestion or heartburn.     diphenhydrAMINE (BENADRYL) 25 MG tablet Take 25 mg by mouth every 6 (six) hours as needed for allergies.     famotidine (PEPCID) 20 MG tablet Take 10 mg by mouth every morning.     HYDROcodone-acetaminophen (NORCO/VICODIN) 5-325 MG tablet Take 1-2 tablets by mouth every 6 (six) hours as needed for moderate pain or severe pain. 8 tablet 0   metoprolol succinate (TOPROL-XL) 25 MG 24 hr tablet TAKE 1/2 TABLET(12.5 MG) BY MOUTH DAILY 45 tablet 2   mometasone (NASONEX) 50 MCG/ACT nasal spray Place 2 sprays into the nose daily as needed (allergies).     pimecrolimus (ELIDEL) 1 % cream Apply 1 application  topically daily as needed (eczema).     Polyethyl Glycol-Propyl Glycol (SYSTANE OP) Place 1 drop into both eyes 2 (two) times daily as needed (dry eyes).     triamcinolone ointment (KENALOG) 0.1 % Apply 1 Application topically daily as needed (psoriasis).     No current facility-administered medications for this visit.    REVIEW OF SYSTEMS:   Constitutional: Denies fevers, chills or abnormal night sweats Eyes: Denies blurriness of vision, double vision or watery eyes Ears, nose, mouth, throat, and face: Denies mucositis or sore  throat Respiratory: Denies cough, dyspnea or wheezes Cardiovascular: Denies palpitation, chest discomfort or lower extremity swelling Gastrointestinal:  Denies nausea, heartburn or change in bowel habits Skin: Denies abnormal skin rashes Lymphatics: Denies new lymphadenopathy or easy bruising Neurological:Denies numbness, tingling or new weaknesses Behavioral/Psych: Mood is stable, no new changes  GU: Positive for urinary frequency at night All other systems were reviewed with the patient and are negative.  PHYSICAL EXAMINATION: ECOG PERFORMANCE STATUS: 0 - Asymptomatic  Vitals:   01/13/23 1131  BP: 124/75  Pulse: (!) 59  Resp: 17  Temp: 98 F (36.7 C)  SpO2: 97%   Filed Weights   01/13/23 1131  Weight: 200 lb 12.8 oz (91.1 kg)    GENERAL:alert, no distress and comfortable SKIN: skin color, texture, turgor are normal, no rashes or significant lesions EYES: normal, conjunctiva are pink and non-injected, sclera clear OROPHARYNX:no exudate, no erythema and lips, buccal mucosa, and tongue normal  NECK: supple, thyroid normal size, non-tender, without nodularity LYMPH:  no palpable lymphadenopathy in the cervical, axillary or inguinal LUNGS: clear to auscultation and percussion with normal breathing effort HEART: regular rate & rhythm and no murmurs and no lower extremity edema ABDOMEN:abdomen soft, non-tender and normal bowel sounds Musculoskeletal:no cyanosis of digits and no clubbing  PSYCH: alert & oriented x 3 with fluent speech NEURO: no focal motor/sensory deficits  LABORATORY DATA:  I have reviewed the data as listed    Latest Ref Rng & Units 01/13/2023   12:06 PM 11/14/2022    9:05 AM 03/24/2022   12:17 PM  CBC  WBC 4.0 - 10.5 K/uL 7.9  6.8  11.0   Hemoglobin 13.0 - 17.0 g/dL 14.5  14.7  12.7   Hematocrit 39.0 - 52.0 % 44.2  47.1  40.7   Platelets 150 - 400 K/uL 223  200  203     '@cmpl'$ @  RADIOGRAPHIC STUDIES: I have personally reviewed the radiological images  as listed and agreed with the findings in the report. No results found.  ASSESSMENT & PLAN: 72 year old gentleman  Recurrent nonmuscular invasive bladder cancer, history of high-grade T1 lesion  -He has had multiple recurrence since 2020, status  post multiple TURBT, he had high-grade T1 in April 2020, and May 2020, 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  Largest tumor was 2 cm. -Status post BCG maintenance therapy, and bladder instillation of gemcitabine with TURBT twice -Given the high risk disease, and multiple recurrence, I recommend intravesical gemcitabine weekly for 6 treatments.  Potential benefit to reduce his future recurrence, and side effects, especially bladder spasm, hematuria, fever, fatigue, were discussed with him in great detail, he agrees to proceed. -Plan to start next week -I also discussed other intravesical chemo, such as mitomycin, if he does not tolerated gemcitabine well. -I also discussed the option of systemic treatment with Keytruda for 2 years, which complete response 41%.  Benefit and potential side effects discussed with him, he is reluctant to consider.  Given his history of psoriasis, he may not tolerate Keytruda well.  I will hold this for future use if needed.  Plan -I recommend intravesical gemcitabine weekly for 6 treatments -Baseline labs today, and will repeat every 3 weeks -I called in Ditropan XL for him.  He will take 1 dose 1 to 2 hours before each chemo treatment -Follow-up before third and ast treatment.      Orders Placed This Encounter  Procedures   CBC with Differential/Platelet    Standing Status:   Standing    Number of Occurrences:   50    Standing Expiration Date:   01/13/2024   Comprehensive metabolic panel    Standing Status:   Standing    Number of Occurrences:   50    Standing Expiration Date:   01/13/2024   CBC with Differential ([place] Only)    Standing Status:   Future    Standing Expiration Date:   01/20/2024    CMP ([place] only)    Standing Status:   Future    Standing Expiration Date:   01/20/2024   CBC with Differential ([place] Only)    Standing Status:   Future    Standing Expiration Date:   01/27/2024   CMP ([place] only)    Standing Status:   Future    Standing Expiration Date:   01/27/2024   CBC with Differential ([place] Only)    Standing Status:   Future    Standing Expiration Date:   02/03/2024   CMP ([place] only)    Standing Status:   Future    Standing Expiration Date:   02/03/2024   CBC with Differential ([place] Only)    Standing Status:   Future    Standing Expiration Date:   02/10/2024   CMP ([place] only)    Standing Status:   Future    Standing Expiration Date:   02/10/2024   CBC with Differential ([place] Only)    Standing Status:   Future    Standing Expiration Date:   02/17/2024   CMP ([place] only)    Standing Status:   Future    Standing Expiration Date:   02/17/2024   CBC with Differential ([place] Only)    Standing Status:   Future    Standing Expiration Date:   02/24/2024   CMP ([place] only)    Standing Status:   Future    Standing Expiration Date:   02/24/2024    All questions were answered. The patient knows to call the clinic with any problems, questions or concerns. I spent 45 minutes counseling the patient face to face. The total time spent in the  appointment was 50 minutes and more than 50% was on counseling.     Truitt Merle, MD 01/13/2023

## 2023-01-13 NOTE — Progress Notes (Signed)
START OFF PATHWAY REGIMEN - Bladder   [id number]:Gemcitabine Intravesical 2,000 mg D1 q7 Days x 6 Cycles:   A cycle is every 7 days:     Gemcitabine   **Always confirm dose/schedule in your pharmacy ordering system**  Patient Characteristics: Pre-Cystectomy or Nonsurgical Candidate, M0 (Clinical Staging), High-Grade cTa, cN0 or cTis/cT1, cN0, No Prior Intravesical Therapy Therapeutic Status: Pre-Cystectomy or Nonsurgical Candidate, M0 (Clinical Staging) AJCC M Category: cM0 AJCC 8 Stage Grouping: I AJCC T Category: cT1 AJCC N Category: cN0 Intent of Therapy: Curative Intent, Discussed with Patient

## 2023-01-16 ENCOUNTER — Telehealth: Payer: Self-pay | Admitting: Hematology

## 2023-01-16 NOTE — Telephone Encounter (Signed)
Contacted patient to scheduled appointments. Patient is aware of appointments that are scheduled.   

## 2023-01-17 ENCOUNTER — Other Ambulatory Visit: Payer: Self-pay

## 2023-01-17 ENCOUNTER — Inpatient Hospital Stay: Payer: PPO

## 2023-01-18 NOTE — Progress Notes (Signed)
[place] OFFICE PROGRESS NOTE  Jeremy Smoker, MD [place] [id number]  DIAGNOSIS: Recurrent bladder cancer   CURRENT THERAPY: Intravesical gemcitabine weekly for 6 treatments. He is here for his first treatment today   INTERVAL HISTORY: Jeremy Day 72 y.o. male returns to the clinic today for a follow-up visit accompanied by his wife.  The patient was last seen by Dr. Burr Day last week.  Dr. Burr Day recommended intravesicular gemcitabine weekly x 6 due to his high risk disease and multiple recurrences.  The patient is here for his first weekly treatment today.  He denies any changes in his health since last being seen.  He denies any abdominal pain, or hematuria.  He sometimes has urinary frequency at nighttime.  He took his Ditropan dose 2 hours prior to his appointment today.  He denies any fever, chills, night sweats, or unexplained weight loss.  Denies any abdominal pain.  Denies any appetite change.  Denies any nausea, vomiting, diarrhea, or constipation.  Denies any dysuria.  Denies any back pain.  He is here today for evaluation before undergoing his first week of treatment.  MEDICAL HISTORY: Past Medical History:  Diagnosis Date   Abnormal tandem walk    using 2 canes due to lower back arthritis   Ascending aorta dilatation ([place])    echo  04-11-2016  77mm and dilated aortc root 63mm   Bladder tumor    BPH (benign prostatic hyperplasia)    Cancer ([place]) 2020   bladder   Depression    Diverticulosis    Full dentures    GERD (gastroesophageal reflux disease)    Hiatal hernia    History of colon polyps    History of staphylococcal infection 2008   right ankle  w/ sepsis   Meningitis spinal age 30 or 4   OA (osteoarthritis)    lower back, right ankle,, bilateral knees and shouldres   Pneumonia yrs ago   Pre-diabetes    Psoriasis    PVC's (premature ventricular contractions)    2012-- hx bigeminy/ trigeminy with near syncope  (cardiology consult note in epic dated 2012)  (last cardiology visit in epic w/ dr Marlou Porch, 03-27-2016)   Seasonal allergies    Urgency of urination     ALLERGIES:  has No Known Allergies.  MEDICATIONS:  Current Outpatient Medications  Medication Sig Dispense Refill   acetaminophen (TYLENOL) 500 MG tablet Take 1,000 mg by mouth every 6 (six) hours as needed for moderate pain.     calcium carbonate (TUMS - DOSED IN MG ELEMENTAL CALCIUM) 500 MG chewable tablet Chew 1 tablet by mouth daily as needed for indigestion or heartburn.     diphenhydrAMINE (BENADRYL) 25 MG tablet Take 25 mg by mouth every 6 (six) hours as needed for allergies.     famotidine (PEPCID) 20 MG tablet Take 10 mg by mouth every morning.     HYDROcodone-acetaminophen (NORCO/VICODIN) 5-325 MG tablet Take 1-2 tablets by mouth every 6 (six) hours as needed for moderate pain or severe pain. 8 tablet 0   metoprolol succinate (TOPROL-XL) 25 MG 24 hr tablet TAKE 1/2 TABLET(12.5 MG) BY MOUTH DAILY 45 tablet 2   mometasone (NASONEX) 50 MCG/ACT nasal spray Place 2 sprays into the nose daily as needed (allergies).     oxybutynin (DITROPAN-XL) 10 MG 24 hr tablet Take 1 tablet (10 mg total) by mouth once a week. Take 1-2 hours before each chemotherapy 10 tablet 0   pimecrolimus (ELIDEL)  1 % cream Apply 1 application  topically daily as needed (eczema).     Polyethyl Glycol-Propyl Glycol (SYSTANE OP) Place 1 drop into both eyes 2 (two) times daily as needed (dry eyes).     triamcinolone ointment (KENALOG) 0.1 % Apply 1 Application topically daily as needed (psoriasis).     No current facility-administered medications for this visit.    SURGICAL HISTORY:  Past Surgical History:  Procedure Laterality Date   ANKLE ARTHROSCOPY Right 02-13-2007    dr [name]   w/ debridement and removal loose body   COLONOSCOPY  last one summer 2019   [place] Right 1980s   INGUINAL LYMPH NODE BIOPSY  2001  approx.   benign   SPINAL FUSION      "T8 through sacral area".   TONSILLECTOMY     TRANSURETHRAL RESECTION OF BLADDER TUMOR N/A 02/06/2019   Procedure: TRANSURETHRAL RESECTION OF BLADDER TUMOR (TURBT);  Surgeon: Lucas Mallow, MD;  [facility];  Service: Urology;  Laterality: N/A;   TRANSURETHRAL RESECTION OF BLADDER TUMOR N/A 02/20/2019   Procedure: TRANSURETHRAL RESECTION OF BLADDER TUMOR (TURBT);  Surgeon: Lucas Mallow, MD;  [facility];  Service: Urology;  Laterality: N/A;   TRANSURETHRAL RESECTION OF BLADDER TUMOR N/A 03/13/2019   Procedure: TRANSURETHRAL RESECTION OF BLADDER TUMOR (TURBT);  Surgeon: Lucas Mallow, MD;  [facility];  Service: Urology;  Laterality: N/A;   TRANSURETHRAL RESECTION OF BLADDER TUMOR N/A 11/20/2020   Procedure: TRANSURETHRAL RESECTION OF BLADDER TUMOR (TURBT) WITH INSTILLATION OF post-op GEMCITABINE;  Surgeon: Janith Lima, MD;  Location: [facility];  Service: Urology;  Laterality: N/A;   TRANSURETHRAL RESECTION OF BLADDER TUMOR WITH MITOMYCIN-C N/A 09/23/2019   Procedure: TRANSURETHRAL RESECTION OF BLADDER TUMOR WITH GEMCITABINE INSTILLATION INTO THE BLADDER;  Surgeon: Lucas Mallow, MD;  Location: [facility];  Service: Urology;  Laterality: N/A;   TRANSURETHRAL RESECTION OF BLADDER TUMOR WITH MITOMYCIN-C N/A 06/24/2020   Procedure: TRANSURETHRAL RESECTION OF BLADDER TUMOR WITH POST OPERATIVE GEMCITABINE INSTILLATION;  Surgeon: Lucas Mallow, MD;  Location: [facility];  Service: Urology;  Laterality: N/A;   TRANSURETHRAL RESECTION OF BLADDER TUMOR WITH MITOMYCIN-C Bilateral 11/21/2022   Procedure: CYSTOSCOPY BILATERAL RETROGRADE PYELOGRAM TRANSURETHRAL RESECTION OF BLADDER TUMOR WITH GEMCITABINE;  Surgeon: Lucas Mallow, MD;  [facility];  Service: Urology;  Laterality: Bilateral;  1 HR FOR CASE    REVIEW OF SYSTEMS:   Review of Systems  Constitutional: Negative for appetite change, chills,  fatigue, fever and unexpected weight change.  HENT: Negative for mouth sores, nosebleeds, sore throat and trouble swallowing.   Eyes: Negative for eye problems and icterus.  Respiratory: Negative for cough, hemoptysis, shortness of breath and wheezing.   Cardiovascular: Negative for chest pain and leg swelling.  Gastrointestinal: Negative for abdominal pain, constipation, diarrhea, nausea and vomiting.  Genitourinary: Positive for urinary frequency at night.  Negative for bladder incontinence, difficulty urinating, dysuria, and hematuria.   Musculoskeletal: Negative for back pain, gait problem, neck pain and neck stiffness.  Skin: Negative for itching and rash.  Neurological: Negative for dizziness, extremity weakness, gait problem, headaches, light-headedness and seizures.  Hematological: Negative for adenopathy. Does not bruise/bleed easily.  Psychiatric/Behavioral: Negative for confusion, depression and sleep disturbance. The patient is not nervous/anxious.     PHYSICAL EXAMINATION:  Blood pressure 129/83, pulse (!) 51, temperature 97.6 F (36.4 C), temperature source Oral, resp. rate 16, weight 200  lb (90.7 kg), SpO2 99 %.  ECOG PERFORMANCE STATUS: 1  Physical Exam  Constitutional: Oriented to person, place, and time and well-developed, well-nourished, and in no distress.  HENT:  Head: Normocephalic and atraumatic.  Mouth/Throat: Oropharynx is clear and moist. No oropharyngeal exudate.  Eyes: Conjunctivae are normal. Right eye exhibits no discharge. Left eye exhibits no discharge. No scleral icterus.  Neck: Normal range of motion. Neck supple.  Cardiovascular: Bradycardic, regular rhythm, normal heart sounds and intact distal pulses.   Pulmonary/Chest: Effort normal and breath sounds normal. No respiratory distress. No wheezes. No rales.  Abdominal: Soft. Bowel sounds are normal. Exhibits no distension and no mass. There is no tenderness.  Musculoskeletal: Normal range of motion.  Exhibits no edema.  Lymphadenopathy:    No cervical adenopathy.  Neurological: Alert and oriented to person, place, and time. Exhibits normal muscle tone. Gait normal. Coordination normal.  Skin: Skin is warm and dry. No rash noted. Not diaphoretic. No erythema. No pallor.  Psychiatric: Mood, memory and judgment normal.  Vitals reviewed.  LABORATORY DATA: Lab Results  Component Value Date   WBC 7.9 01/13/2023   HGB 14.5 01/13/2023   HCT 44.2 01/13/2023   MCV 91.3 01/13/2023   PLT 223 01/13/2023      Chemistry      Component Value Date/Time   NA 138 01/13/2023 1206   K 4.6 01/13/2023 1206   CL 103 01/13/2023 1206   CO2 30 01/13/2023 1206   BUN 11 01/13/2023 1206   CREATININE 0.91 01/13/2023 1206   CREATININE 0.75 07/15/2015 1001      Component Value Date/Time   CALCIUM 9.5 01/13/2023 1206   ALKPHOS 87 01/13/2023 1206   AST 17 01/13/2023 1206   ALT 12 01/13/2023 1206   BILITOT 0.5 01/13/2023 1206       RADIOGRAPHIC STUDIES:  No results found.   ASSESSMENT/PLAN:  This is a very pleasant 72 year old gentleman with    Recurrent nonmuscular invasive bladder cancer, history of high-grade T1 lesion             -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 -Status post BCG maintenance therapy, and bladder instillation of gemcitabine with TURBT twice -Given the high risk disease, and multiple recurrence, Dr. Burr Day recommend intravesical gemcitabine weekly for 6 treatments.  Potential benefit to reduce his future recurrence, and side effects, especially bladder spasm, hematuria, fever, fatigue, were discussed with him in great detail, he agrees to proceed today.  -He is here for his first treatment today. He does not have any questions.  -Recommend he proceed as scheduled.  -Per Dr. Ernestina Penna LOS from last week, recommend provider  visit with 3rd and 6th week of treatment. He was given a copy of his schedule today.  -Ok to proceed with treatment today with last weeks labs (3/8) which were reviewed.    Plan -Proceed with intravesical gemcitabine weekly for 6 treatments -Baseline labs last week, and will repeat every 3 weeks -Follow-up before third and last treatment per Dr. Ernestina Penna last note    No orders of the defined types were placed in this encounter.    The total time spent in the appointment was 20-29 minutes.   Eisley Barber L Anallely Rosell, PA-C 01/20/23

## 2023-01-20 ENCOUNTER — Inpatient Hospital Stay (HOSPITAL_BASED_OUTPATIENT_CLINIC_OR_DEPARTMENT_OTHER): Payer: PPO | Admitting: Physician Assistant

## 2023-01-20 ENCOUNTER — Telehealth: Payer: Self-pay | Admitting: *Deleted

## 2023-01-20 ENCOUNTER — Other Ambulatory Visit: Payer: PPO

## 2023-01-20 ENCOUNTER — Inpatient Hospital Stay: Payer: PPO

## 2023-01-20 DIAGNOSIS — C678 Malignant neoplasm of overlapping sites of bladder: Secondary | ICD-10-CM | POA: Diagnosis not present

## 2023-01-20 DIAGNOSIS — Z5111 Encounter for antineoplastic chemotherapy: Secondary | ICD-10-CM | POA: Diagnosis not present

## 2023-01-20 MED ORDER — LIDOCAINE HCL URETHRAL/MUCOSAL 2 % EX GEL
1.0000 | Freq: Once | CUTANEOUS | Status: DC
  Filled 2023-01-20: qty 1

## 2023-01-20 MED ORDER — PROCHLORPERAZINE MALEATE 10 MG PO TABS
10.0000 mg | ORAL_TABLET | Freq: Once | ORAL | Status: AC
  Administered 2023-01-20: 10 mg via ORAL
  Filled 2023-01-20: qty 1

## 2023-01-20 MED ORDER — GEMCITABINE CHEMO FOR BLADDER INSTILLATION 2000 MG
2000.0000 mg | Freq: Once | INTRAVENOUS | Status: AC
  Administered 2023-01-20: 2000 mg via INTRAVESICAL
  Filled 2023-01-20: qty 52.6

## 2023-01-20 MED ORDER — OXYBUTYNIN CHLORIDE 5 MG PO TABS
5.0000 mg | ORAL_TABLET | Freq: Once | ORAL | Status: AC | PRN
  Administered 2023-01-20: 5 mg via ORAL

## 2023-01-20 NOTE — Patient Instructions (Signed)
[facility]  Discharge Instructions: Thank you for choosing [facility] to provide your oncology and hematology care.   If you have a lab appointment with the Cancer Center, please go directly to the Cancer Center and check in at the registration area.   Wear comfortable clothing and clothing appropriate for easy access to any Portacath or PICC line.   We strive to give you quality time with your provider. You may need to reschedule your appointment if you arrive late (15 or more minutes).  Arriving late affects you and other patients whose appointments are after yours.  Also, if you miss three or more appointments without notifying the office, you may be dismissed from the clinic at the provider's discretion.      For prescription refill requests, have your pharmacy contact our office and allow 72 hours for refills to be completed.    Today you received the following chemotherapy and/or immunotherapy agents gemzar      To help prevent nausea and vomiting after your treatment, we encourage you to take your nausea medication as directed.  BELOW ARE SYMPTOMS THAT SHOULD BE REPORTED IMMEDIATELY: *FEVER GREATER THAN 100.4 F (38 C) OR HIGHER *CHILLS OR SWEATING *NAUSEA AND VOMITING THAT IS NOT CONTROLLED WITH YOUR NAUSEA MEDICATION *UNUSUAL SHORTNESS OF BREATH *UNUSUAL BRUISING OR BLEEDING *URINARY PROBLEMS (pain or burning when urinating, or frequent urination) *BOWEL PROBLEMS (unusual diarrhea, constipation, pain near the anus) TENDERNESS IN MOUTH AND THROAT WITH OR WITHOUT PRESENCE OF ULCERS (sore throat, sores in mouth, or a toothache) UNUSUAL RASH, SWELLING OR PAIN  UNUSUAL VAGINAL DISCHARGE OR ITCHING   Items with * indicate a potential emergency and should be followed up as soon as possible or go to the Emergency Department if any problems should occur.  Please show the CHEMOTHERAPY ALERT CARD or IMMUNOTHERAPY ALERT CARD at check-in  to the Emergency Department and triage nurse.  Should you have questions after your visit or need to cancel or reschedule your appointment, please contact [facility]  Dept: [phone]  and follow the prompts.  Office hours are 8:00 a.m. to 4:30 p.m. Monday - Friday. Please note that voicemails left after 4:00 p.m. may not be returned until the following business day.  We are closed weekends and major holidays. You have access to a nurse at all times for urgent questions. Please call the main number to the clinic Dept: [phone] and follow the prompts.   For any non-urgent questions, you may also contact your provider using MyChart. We now offer e-Visits for anyone 18 and older to request care online for non-urgent symptoms. For details visit [email or web address].   Also download the MyChart app! Go to the app store, search "MyChart", open the app, select [facility], and log in with your MyChart username and password.   

## 2023-01-20 NOTE — Telephone Encounter (Signed)
-----   Message from Gillian Shields, RN sent at 01/20/2023 10:59 AM EDT ----- Regarding: FT chemo Feng vesicular gemzar FT vesicular gemzar at [facility] (previously had 2 infusions while inpatient). Dr. Burr Medico, but saw Cassie today. Able to complete entire treatment

## 2023-01-20 NOTE — Progress Notes (Signed)
Foley catheter inserted using 32F/30cc balloon. Pt tolerated well. 350 cc urine return over 30 minutes. Pt took 10mg  ditropan XL prior to arrival and 5mg  Ditropan immediately prior to chemo. Catheter clamped. Chemo administered over 10 minutes. Pt rotated back, right side, left side, abdomen every 15 minutes for a total of 60 minutes from administration start time. Pt tolerated well with no leakage. Catheter unclamped and drained for 25 minutes. VSS and pt discharged in stable condition.

## 2023-01-20 NOTE — Telephone Encounter (Signed)
Left message on mobile # to call back to let us know how he is doing post treatment.

## 2023-01-24 ENCOUNTER — Telehealth: Payer: Self-pay | Admitting: Hematology

## 2023-01-24 NOTE — Telephone Encounter (Signed)
Called patient regarding upcoming appointments, patient is notified. 

## 2023-01-27 ENCOUNTER — Inpatient Hospital Stay: Payer: PPO

## 2023-01-27 ENCOUNTER — Other Ambulatory Visit: Payer: Self-pay

## 2023-01-27 ENCOUNTER — Other Ambulatory Visit: Payer: Self-pay | Admitting: Hematology

## 2023-01-27 VITALS — BP 115/83 | HR 53 | Temp 97.5°F | Resp 17 | Wt 200.0 lb

## 2023-01-27 DIAGNOSIS — C678 Malignant neoplasm of overlapping sites of bladder: Secondary | ICD-10-CM

## 2023-01-27 DIAGNOSIS — Z5111 Encounter for antineoplastic chemotherapy: Secondary | ICD-10-CM | POA: Diagnosis not present

## 2023-01-27 LAB — CBC WITH DIFFERENTIAL (CANCER CENTER ONLY)
Abs Immature Granulocytes: 0.03 10*3/uL (ref 0.00–0.07)
Basophils Absolute: 0.1 10*3/uL (ref 0.0–0.1)
Basophils Relative: 1 %
Eosinophils Absolute: 0.1 10*3/uL (ref 0.0–0.5)
Eosinophils Relative: 2 %
HCT: 43.3 % (ref 39.0–52.0)
Hemoglobin: 14.2 g/dL (ref 13.0–17.0)
Immature Granulocytes: 1 %
Lymphocytes Relative: 27 %
Lymphs Abs: 1.4 10*3/uL (ref 0.7–4.0)
MCH: 30.8 pg (ref 26.0–34.0)
MCHC: 32.8 g/dL (ref 30.0–36.0)
MCV: 93.9 fL (ref 80.0–100.0)
Monocytes Absolute: 0.7 10*3/uL (ref 0.1–1.0)
Monocytes Relative: 14 %
Neutro Abs: 2.8 10*3/uL (ref 1.7–7.7)
Neutrophils Relative %: 55 %
Platelet Count: 178 10*3/uL (ref 150–400)
RBC: 4.61 MIL/uL (ref 4.22–5.81)
RDW: 16.2 % — ABNORMAL HIGH (ref 11.5–15.5)
WBC Count: 5.1 10*3/uL (ref 4.0–10.5)
nRBC: 0 % (ref 0.0–0.2)

## 2023-01-27 LAB — CMP (CANCER CENTER ONLY)
ALT: 12 U/L (ref 0–44)
AST: 16 U/L (ref 15–41)
Albumin: 3.9 g/dL (ref 3.5–5.0)
Alkaline Phosphatase: 82 U/L (ref 38–126)
Anion gap: 5 (ref 5–15)
BUN: 14 mg/dL (ref 8–23)
CO2: 29 mmol/L (ref 22–32)
Calcium: 9.1 mg/dL (ref 8.9–10.3)
Chloride: 106 mmol/L (ref 98–111)
Creatinine: 0.91 mg/dL (ref 0.61–1.24)
GFR, Estimated: >60 mL/min (ref >60)
Glucose, Bld: 76 mg/dL (ref 70–99)
Potassium: 4 mmol/L (ref 3.5–5.1)
Sodium: 140 mmol/L (ref 135–145)
Total Bilirubin: 0.5 mg/dL (ref 0.3–1.2)
Total Protein: 6.8 g/dL (ref 6.5–8.1)

## 2023-01-27 MED ORDER — GEMCITABINE CHEMO FOR BLADDER INSTILLATION 2000 MG
2000.0000 mg | Freq: Once | INTRAVENOUS | Status: AC
  Administered 2023-01-27: 2000 mg via INTRAVESICAL
  Filled 2023-01-27: qty 52.6

## 2023-01-27 MED ORDER — PROCHLORPERAZINE MALEATE 10 MG PO TABS
10.0000 mg | ORAL_TABLET | Freq: Once | ORAL | Status: AC
  Administered 2023-01-27: 10 mg via ORAL
  Filled 2023-01-27: qty 1

## 2023-01-27 MED ORDER — LIDOCAINE HCL URETHRAL/MUCOSAL 2 % EX GEL
1.0000 | Freq: Once | CUTANEOUS | Status: AC
  Administered 2023-01-27: 1 via URETHRAL
  Filled 2023-01-27: qty 1

## 2023-01-27 MED ORDER — OXYBUTYNIN CHLORIDE 5 MG PO TABS: 5.0000 mg | ORAL_TABLET | Freq: Once | ORAL | Status: DC | PRN

## 2023-01-27 MED ORDER — SODIUM CHLORIDE 0.9 % IV SOLN: Freq: Once | INTRAVENOUS | Status: DC

## 2023-01-27 NOTE — Patient Instructions (Signed)
[facility]  Discharge Instructions: Thank you for choosing [facility] to provide your oncology and hematology care.   If you have a lab appointment with the Cancer Center, please go directly to the Cancer Center and check in at the registration area.   Wear comfortable clothing and clothing appropriate for easy access to any Portacath or PICC line.   We strive to give you quality time with your provider. You may need to reschedule your appointment if you arrive late (15 or more minutes).  Arriving late affects you and other patients whose appointments are after yours.  Also, if you miss three or more appointments without notifying the office, you may be dismissed from the clinic at the provider's discretion.      For prescription refill requests, have your pharmacy contact our office and allow 72 hours for refills to be completed.    Today you received the following chemotherapy and/or immunotherapy agents: Gemzar      To help prevent nausea and vomiting after your treatment, we encourage you to take your nausea medication as directed.  BELOW ARE SYMPTOMS THAT SHOULD BE REPORTED IMMEDIATELY: *FEVER GREATER THAN 100.4 F (38 C) OR HIGHER *CHILLS OR SWEATING *NAUSEA AND VOMITING THAT IS NOT CONTROLLED WITH YOUR NAUSEA MEDICATION *UNUSUAL SHORTNESS OF BREATH *UNUSUAL BRUISING OR BLEEDING *URINARY PROBLEMS (pain or burning when urinating, or frequent urination) *BOWEL PROBLEMS (unusual diarrhea, constipation, pain near the anus) TENDERNESS IN MOUTH AND THROAT WITH OR WITHOUT PRESENCE OF ULCERS (sore throat, sores in mouth, or a toothache) UNUSUAL RASH, SWELLING OR PAIN  UNUSUAL VAGINAL DISCHARGE OR ITCHING   Items with * indicate a potential emergency and should be followed up as soon as possible or go to the Emergency Department if any problems should occur.  Please show the CHEMOTHERAPY ALERT CARD or IMMUNOTHERAPY ALERT CARD at check-in  to the Emergency Department and triage nurse.  Should you have questions after your visit or need to cancel or reschedule your appointment, please contact [facility]  Dept: [phone]  and follow the prompts.  Office hours are 8:00 a.m. to 4:30 p.m. Monday - Friday. Please note that voicemails left after 4:00 p.m. may not be returned until the following business day.  We are closed weekends and major holidays. You have access to a nurse at all times for urgent questions. Please call the main number to the clinic Dept: [phone] and follow the prompts.   For any non-urgent questions, you may also contact your provider using MyChart. We now offer e-Visits for anyone 18 and older to request care online for non-urgent symptoms. For details visit [email or web address].   Also download the MyChart app! Go to the app store, search "MyChart", open the app, select , and log in with your MyChart username and password.   

## 2023-02-03 ENCOUNTER — Inpatient Hospital Stay: Payer: PPO

## 2023-02-03 VITALS — BP 120/78 | HR 61 | Temp 97.4°F | Resp 18

## 2023-02-03 DIAGNOSIS — Z5111 Encounter for antineoplastic chemotherapy: Secondary | ICD-10-CM | POA: Diagnosis not present

## 2023-02-03 DIAGNOSIS — C678 Malignant neoplasm of overlapping sites of bladder: Secondary | ICD-10-CM

## 2023-02-03 MED ORDER — OXYBUTYNIN CHLORIDE 5 MG PO TABS: 5.0000 mg | ORAL_TABLET | Freq: Once | ORAL | Status: DC | PRN

## 2023-02-03 MED ORDER — SODIUM CHLORIDE 0.9 % IV SOLN: Freq: Once | INTRAVENOUS | Status: DC

## 2023-02-03 MED ORDER — PROCHLORPERAZINE MALEATE 10 MG PO TABS
10.0000 mg | ORAL_TABLET | Freq: Once | ORAL | Status: AC
  Administered 2023-02-03: 10 mg via ORAL
  Filled 2023-02-03: qty 1

## 2023-02-03 MED ORDER — LIDOCAINE HCL URETHRAL/MUCOSAL 2 % EX GEL
1.0000 | Freq: Once | CUTANEOUS | Status: AC
  Administered 2023-02-03: 1 via URETHRAL
  Filled 2023-02-03: qty 1

## 2023-02-03 MED ORDER — GEMCITABINE CHEMO FOR BLADDER INSTILLATION 2000 MG
2000.0000 mg | Freq: Once | INTRAVENOUS | Status: AC
  Administered 2023-02-03: 2000 mg via INTRAVESICAL
  Filled 2023-02-03: qty 52.6

## 2023-02-03 NOTE — Progress Notes (Signed)
18G Foley catheter inserted. 30cc balloon inflated. 222ml urine output obtained prior to chemo. Pt took 10mg  Ditropan prior to arriving for intravesicular chemo. Catheter clamped. Chemo administered over 10 minutes. Pt rotated back, right side, left side, and abdomen every 15 minutes. Total dwell time 60 minutes. Pt tolerated well with no leakage. Catheter unclamped and drained for 30 minutes.  VSS and pt discharged in stable condition.

## 2023-02-03 NOTE — Patient Instructions (Signed)
Gemcitabine Injection What is this medication? GEMCITABINE (jem SYE ta been) treats some types of cancer. It works by slowing down the growth of cancer cells. This medicine may be used for other purposes; ask your health care provider or pharmacist if you have questions. COMMON BRAND NAME(S): Gemzar, Infugem What should I tell my care team before I take this medication? They need to know if you have any of these conditions: Blood disorders Infection Kidney disease Liver disease Lung or breathing disease, such as asthma or COPD Recent or ongoing radiation therapy An unusual or allergic reaction to gemcitabine, other medications, foods, dyes, or preservatives If you or your partner are pregnant or trying to get pregnant Breast-feeding How should I use this medication? This medication is injected into a vein. It is given by your care team in a hospital or clinic setting. Talk to your care team about the use of this medication in children. Special care may be needed. Overdosage: If you think you have taken too much of this medicine contact a poison control center or emergency room at once. NOTE: This medicine is only for you. Do not share this medicine with others. What if I miss a dose? Keep appointments for follow-up doses. It is important not to miss your dose. Call your care team if you are unable to keep an appointment. What may interact with this medication? Interactions have not been studied. This list may not describe all possible interactions. Give your health care provider a list of all the medicines, herbs, non-prescription drugs, or dietary supplements you use. Also tell them if you smoke, drink alcohol, or use illegal drugs. Some items may interact with your medicine. What should I watch for while using this medication? Your condition will be monitored carefully while you are receiving this medication. This medication may make you feel generally unwell. This is not uncommon, as  chemotherapy can affect healthy cells as well as cancer cells. Report any side effects. Continue your course of treatment even though you feel ill unless your care team tells you to stop. In some cases, you may be given additional medications to help with side effects. Follow all directions for their use. This medication may increase your risk of getting an infection. Call your care team for advice if you get a fever, chills, sore throat, or other symptoms of a cold or flu. Do not treat yourself. Try to avoid being around people who are sick. This medication may increase your risk to bruise or bleed. Call your care team if you notice any unusual bleeding. Be careful brushing or flossing your teeth or using a toothpick because you may get an infection or bleed more easily. If you have any dental work done, tell your dentist you are receiving this medication. Avoid taking medications that contain aspirin, acetaminophen, ibuprofen, naproxen, or ketoprofen unless instructed by your care team. These medications may hide a fever. Talk to your care team if you or your partner wish to become pregnant or think you might be pregnant. This medication can cause serious birth defects if taken during pregnancy and for 6 months after the last dose. A negative pregnancy test is required before starting this medication. A reliable form of contraception is recommended while taking this medication and for 6 months after the last dose. Talk to your care team about effective forms of contraception. Do not father a child while taking this medication and for 3 months after the last dose. Use a condom while having sex   during this time period. Do not breastfeed while taking this medication and for at least 1 week after the last dose. This medication may cause infertility. Talk to your care team if you are concerned about your fertility. What side effects may I notice from receiving this medication? Side effects that you should  report to your care team as soon as possible: Allergic reactions--skin rash, itching, hives, swelling of the face, lips, tongue, or throat Capillary leak syndrome--stomach or muscle pain, unusual weakness or fatigue, feeling faint or lightheaded, decrease in the amount of urine, swelling of the ankles, hands, or feet, trouble breathing Infection--fever, chills, cough, sore throat, wounds that don't heal, pain or trouble when passing urine, general feeling of discomfort or being unwell Liver injury--right upper belly pain, loss of appetite, nausea, light-colored stool, dark yellow or brown urine, yellowing skin or eyes, unusual weakness or fatigue Low red blood cell level--unusual weakness or fatigue, dizziness, headache, trouble breathing Lung injury--shortness of breath or trouble breathing, cough, spitting up blood, chest pain, fever Stomach pain, bloody diarrhea, pale skin, unusual weakness or fatigue, decrease in the amount of urine, which may be signs of hemolytic uremic syndrome Sudden and severe headache, confusion, change in vision, seizures, which may be signs of posterior reversible encephalopathy syndrome (PRES) Unusual bruising or bleeding Side effects that usually do not require medical attention (report to your care team if they continue or are bothersome): Diarrhea Drowsiness Hair loss Nausea Pain, redness, or swelling with sores inside the mouth or throat Vomiting This list may not describe all possible side effects. Call your doctor for medical advice about side effects. You may report side effects to FDA at 1-800-FDA-1088. Where should I keep my medication? This medication is given in a hospital or clinic. It will not be stored at home. NOTE: This sheet is a summary. It may not cover all possible information. If you have questions about this medicine, talk to your doctor, pharmacist, or health care provider.  2023 Elsevier/Gold Standard (2022-03-01 00:00:00)  

## 2023-02-07 ENCOUNTER — Other Ambulatory Visit: Payer: Self-pay

## 2023-02-10 ENCOUNTER — Encounter: Payer: Self-pay | Admitting: Hematology

## 2023-02-10 ENCOUNTER — Inpatient Hospital Stay: Payer: PPO | Attending: Hematology

## 2023-02-10 ENCOUNTER — Inpatient Hospital Stay: Payer: PPO

## 2023-02-10 ENCOUNTER — Inpatient Hospital Stay (HOSPITAL_BASED_OUTPATIENT_CLINIC_OR_DEPARTMENT_OTHER): Payer: PPO | Admitting: Hematology

## 2023-02-10 VITALS — BP 133/83 | HR 66 | Resp 17

## 2023-02-10 VITALS — BP 113/75 | HR 58 | Temp 97.7°F | Resp 15 | Ht 72.0 in | Wt 199.3 lb

## 2023-02-10 DIAGNOSIS — Z5111 Encounter for antineoplastic chemotherapy: Secondary | ICD-10-CM | POA: Insufficient documentation

## 2023-02-10 DIAGNOSIS — Z79899 Other long term (current) drug therapy: Secondary | ICD-10-CM | POA: Diagnosis not present

## 2023-02-10 DIAGNOSIS — C678 Malignant neoplasm of overlapping sites of bladder: Secondary | ICD-10-CM

## 2023-02-10 LAB — COMPREHENSIVE METABOLIC PANEL
ALT: 13 U/L (ref 0–44)
AST: 20 U/L (ref 15–41)
Albumin: 4 g/dL (ref 3.5–5.0)
Alkaline Phosphatase: 73 U/L (ref 38–126)
Anion gap: 6 (ref 5–15)
BUN: 10 mg/dL (ref 8–23)
CO2: 27 mmol/L (ref 22–32)
Calcium: 9.4 mg/dL (ref 8.9–10.3)
Chloride: 105 mmol/L (ref 98–111)
Creatinine, Ser: 0.94 mg/dL (ref 0.61–1.24)
GFR, Estimated: >60 mL/min (ref >60)
Glucose, Bld: 94 mg/dL (ref 70–99)
Potassium: 4.3 mmol/L (ref 3.5–5.1)
Sodium: 138 mmol/L (ref 135–145)
Total Bilirubin: 0.5 mg/dL (ref 0.3–1.2)
Total Protein: 7 g/dL (ref 6.5–8.1)

## 2023-02-10 LAB — CBC WITH DIFFERENTIAL/PLATELET
Abs Immature Granulocytes: 0.05 10*3/uL (ref 0.00–0.07)
Basophils Absolute: 0 10*3/uL (ref 0.0–0.1)
Basophils Relative: 0 %
Eosinophils Absolute: 0.1 10*3/uL (ref 0.0–0.5)
Eosinophils Relative: 2 %
HCT: 43.1 % (ref 39.0–52.0)
Hemoglobin: 14.4 g/dL (ref 13.0–17.0)
Immature Granulocytes: 1 %
Lymphocytes Relative: 26 %
Lymphs Abs: 1.8 10*3/uL (ref 0.7–4.0)
MCH: 31.4 pg (ref 26.0–34.0)
MCHC: 33.4 g/dL (ref 30.0–36.0)
MCV: 93.9 fL (ref 80.0–100.0)
Monocytes Absolute: 1 10*3/uL (ref 0.1–1.0)
Monocytes Relative: 14 %
Neutro Abs: 3.8 10*3/uL (ref 1.7–7.7)
Neutrophils Relative %: 57 %
Platelets: 202 10*3/uL (ref 150–400)
RBC: 4.59 MIL/uL (ref 4.22–5.81)
RDW: 16.8 % — ABNORMAL HIGH (ref 11.5–15.5)
WBC: 6.7 10*3/uL (ref 4.0–10.5)
nRBC: 0 % (ref 0.0–0.2)

## 2023-02-10 MED ORDER — PROCHLORPERAZINE MALEATE 10 MG PO TABS
10.0000 mg | ORAL_TABLET | Freq: Once | ORAL | Status: AC
  Administered 2023-02-10: 10 mg via ORAL
  Filled 2023-02-10: qty 1

## 2023-02-10 MED ORDER — GEMCITABINE CHEMO FOR BLADDER INSTILLATION 2000 MG
2000.0000 mg | Freq: Once | INTRAVENOUS | Status: AC
  Administered 2023-02-10: 2000 mg via INTRAVESICAL
  Filled 2023-02-10: qty 52.6

## 2023-02-10 MED ORDER — LIDOCAINE HCL URETHRAL/MUCOSAL 2 % EX GEL
1.0000 | Freq: Once | CUTANEOUS | Status: AC
  Administered 2023-02-10: 1 via URETHRAL
  Filled 2023-02-10: qty 1

## 2023-02-10 MED ORDER — OXYBUTYNIN CHLORIDE 5 MG PO TABS: 5.0000 mg | ORAL_TABLET | Freq: Once | ORAL | Status: DC | PRN

## 2023-02-10 NOTE — Patient Instructions (Signed)
[facility]  Discharge Instructions: Thank you for choosing [facility] to provide your oncology and hematology care.   If you have a lab appointment with the Cancer Center, please go directly to the Cancer Center and check in at the registration area.   Wear comfortable clothing and clothing appropriate for easy access to any Portacath or PICC line.   We strive to give you quality time with your provider. You may need to reschedule your appointment if you arrive late (15 or more minutes).  Arriving late affects you and other patients whose appointments are after yours.  Also, if you miss three or more appointments without notifying the office, you may be dismissed from the clinic at the provider's discretion.      For prescription refill requests, have your pharmacy contact our office and allow 72 hours for refills to be completed.    Today you received the following chemotherapy and/or immunotherapy agents: Gemzar      To help prevent nausea and vomiting after your treatment, we encourage you to take your nausea medication as directed.  BELOW ARE SYMPTOMS THAT SHOULD BE REPORTED IMMEDIATELY: *FEVER GREATER THAN 100.4 F (38 C) OR HIGHER *CHILLS OR SWEATING *NAUSEA AND VOMITING THAT IS NOT CONTROLLED WITH YOUR NAUSEA MEDICATION *UNUSUAL SHORTNESS OF BREATH *UNUSUAL BRUISING OR BLEEDING *URINARY PROBLEMS (pain or burning when urinating, or frequent urination) *BOWEL PROBLEMS (unusual diarrhea, constipation, pain near the anus) TENDERNESS IN MOUTH AND THROAT WITH OR WITHOUT PRESENCE OF ULCERS (sore throat, sores in mouth, or a toothache) UNUSUAL RASH, SWELLING OR PAIN  UNUSUAL VAGINAL DISCHARGE OR ITCHING   Items with * indicate a potential emergency and should be followed up as soon as possible or go to the Emergency Department if any problems should occur.  Please show the CHEMOTHERAPY ALERT CARD or IMMUNOTHERAPY ALERT CARD at check-in  to the Emergency Department and triage nurse.  Should you have questions after your visit or need to cancel or reschedule your appointment, please contact [facility]  Dept: [phone]  and follow the prompts.  Office hours are 8:00 a.m. to 4:30 p.m. Monday - Friday. Please note that voicemails left after 4:00 p.m. may not be returned until the following business day.  We are closed weekends and major holidays. You have access to a nurse at all times for urgent questions. Please call the main number to the clinic Dept: [phone] and follow the prompts.   For any non-urgent questions, you may also contact your provider using MyChart. We now offer e-Visits for anyone 18 and older to request care online for non-urgent symptoms. For details visit [email or web address].   Also download the MyChart app! Go to the app store, search "MyChart", open the app, select , and log in with your MyChart username and password.   

## 2023-02-10 NOTE — Progress Notes (Signed)
18G Foley catheter inserted. 30cc balloon inflated. 375cc urine output obtained prior to chemo. Pt took home Ditropan along with compazine prior to foley insertion. Catheter clamped. Chemo administered over 10 minutes. Pt rotated back, left side, right side, and abdomen every 15 minutes. Total dwell time 60 minutes. Pt tolerated well with no leakage. Catheter unclamped and drained for 30 minutes with a total of 325cc output post chemotherapy dwell. 30cc balloon deflated and catheter removed.   VSS and pt discharged in stable condition.

## 2023-02-10 NOTE — Assessment & Plan Note (Signed)
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 -Status post BCG maintenance therapy, and bladder instillation of gemcitabine with TURBT twice -Given the high risk disease, and multiple recurrence, I recommend intravesical gemcitabine weekly for 6 treatments. -he started on 01/20/23, he is tolerating well

## 2023-02-10 NOTE — Progress Notes (Signed)
[facility]   Telephone:(336) [phone] Fax:([phone]   Clinic Follow up Note   Patient Care Team: Shon Haleimberlake, Kathryn S, MD as PCP - General (Family Medicine) Jake BatheSkains, Mark C, MD as PCP - Cardiology (Cardiology) Malachy MoodFeng, Marcello Tuzzolino, MD as Attending Physician (Hematology and Oncology)  Date of Service:  02/10/2023  CHIEF COMPLAINT: f/u of  Recurrent bladder cancer   CURRENT THERAPY: Intravesical gemcitabine (2000) q7d   ASSESSMENT:  Jeremy FurbishJohn M Day is a 72 y.o. 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 -Status post BCG maintenance therapy, and bladder instillation of gemcitabine with TURBT twice -Given the high risk disease, and multiple recurrence, I recommend intravesical gemcitabine weekly for 6 treatments. -he started on 01/20/23, he is tolerating well  -lab reviewed, agree for treatment, will proceed week 4 treatment today     PLAN: -lab reviewed -proceed with C4 Intravesical gemcitabine today and continue weekly for 2 more doses -lab/fu and Intravesical Gemcitabine 4/12  SUMMARY OF ONCOLOGIC HISTORY: Oncology History  Malignant neoplasm of overlapping sites of bladder  01/13/2023 Initial Diagnosis   Malignant neoplasm of overlapping sites of bladder (HCC)   01/20/2023 -  Chemotherapy   Patient is on Treatment Plan : BLADDER Gemcitabine INTRAVESICAL (2000) q7d        INTERVAL HISTORY:  Jeremy FurbishJohn M Day is here for a follow up of  Recurrent bladder cancer .He was last seen by  on PA-C Cassie. He presents to the clinic accompanied by wife.Pt state that he had no problems with treatment. Pt denied having any bleeding, chills and fever.     All other systems were reviewed with the patient and are negative.  MEDICAL HISTORY:  Past Medical History:   Diagnosis Date   Abnormal tandem walk    using 2 canes due to lower back arthritis   Ascending aorta dilatation    echo  04-11-2016  38mm and dilated aortc root 39mm   Bladder tumor    BPH (benign prostatic hyperplasia)    Cancer 2020   bladder   Depression    Diverticulosis    Full dentures    GERD (gastroesophageal reflux disease)    Hiatal hernia    History of colon polyps    History of staphylococcal infection 2008   right ankle  w/ sepsis   Meningitis spinal age 89 or 4   OA (osteoarthritis)    lower back, right ankle,, bilateral knees and shouldres   Pneumonia yrs ago   Pre-diabetes    Psoriasis    PVC's (premature ventricular contractions)    2012-- hx bigeminy/ trigeminy with near syncope (cardiology consult note in epic dated 2012)  (last cardiology visit in epic w/ dr Anne Fuskains, 03-27-2016)   Seasonal allergies    Urgency of urination     SURGICAL HISTORY: Past Surgical History:  Procedure Laterality Date   ANKLE ARTHROSCOPY Right 02-13-2007    dr Luiz Blaregraves   w/ debridement and removal loose body   COLONOSCOPY  last one summer 2019   INGUINAL HERNIA REPAIR Right 1980s   INGUINAL LYMPH NODE BIOPSY  2001  approx.   benign   SPINAL FUSION     "T8 through sacral area".   TONSILLECTOMY     TRANSURETHRAL RESECTION OF BLADDER TUMOR N/A 02/06/2019   Procedure:  TRANSURETHRAL RESECTION OF BLADDER TUMOR (TURBT);  Surgeon: Crista ElliotBell, Eugene D III, MD;  Location: [facility];  Service: Urology;  Laterality: N/A;   TRANSURETHRAL RESECTION OF BLADDER TUMOR N/A 02/20/2019   Procedure: TRANSURETHRAL RESECTION OF BLADDER TUMOR (TURBT);  Surgeon: Crista ElliotBell, Eugene D III, MD;  Location: [facility];  Service: Urology;  Laterality: N/A;   TRANSURETHRAL RESECTION OF BLADDER TUMOR N/A 03/13/2019   Procedure: TRANSURETHRAL RESECTION OF BLADDER TUMOR (TURBT);  Surgeon: Crista ElliotBell, Eugene D III, MD;  [facility];  Service: Urology;  Laterality: N/A;   TRANSURETHRAL RESECTION OF BLADDER TUMOR N/A 11/20/2020    Procedure: TRANSURETHRAL RESECTION OF BLADDER TUMOR (TURBT) WITH INSTILLATION OF post-op GEMCITABINE;  Surgeon: Jannifer HickGay, Matthew R, MD;  [facility] [place];  Service: Urology;  Laterality: N/A;   TRANSURETHRAL RESECTION OF BLADDER TUMOR WITH MITOMYCIN-C N/A 09/23/2019   Procedure: TRANSURETHRAL RESECTION OF BLADDER TUMOR WITH GEMCITABINE INSTILLATION INTO THE BLADDER;  Surgeon: Crista ElliotBell, Eugene D III, MD;  [facility] [place];  Service: Urology;  Laterality: N/A;   TRANSURETHRAL RESECTION OF BLADDER TUMOR WITH MITOMYCIN-C N/A 06/24/2020   Procedure: TRANSURETHRAL RESECTION OF BLADDER TUMOR WITH POST OPERATIVE GEMCITABINE INSTILLATION;  Surgeon: Crista ElliotBell, Eugene D III, MD;  [facility] [place];  Service: Urology;  Laterality: N/A;   TRANSURETHRAL RESECTION OF BLADDER TUMOR WITH MITOMYCIN-C Bilateral 11/21/2022   Procedure: CYSTOSCOPY BILATERAL RETROGRADE PYELOGRAM TRANSURETHRAL RESECTION OF BLADDER TUMOR WITH GEMCITABINE;  Surgeon: Crista ElliotBell, Eugene D III, MD;  Location: [facility];  Service: Urology;  Laterality: Bilateral;  1 HR FOR CASE    I have reviewed the social history and family history with the patient and they are unchanged from previous note.  ALLERGIES:  has No Known Allergies.  MEDICATIONS:  Current Outpatient Medications  Medication Sig Dispense Refill   acetaminophen (TYLENOL) 500 MG tablet Take 1,000 mg by mouth every 6 (six) hours as needed for moderate pain.     calcium carbonate (TUMS - DOSED IN MG ELEMENTAL CALCIUM) 500 MG chewable tablet Chew 1 tablet by mouth daily as needed for indigestion or heartburn.     diphenhydrAMINE (BENADRYL) 25 MG tablet Take 25 mg by mouth every 6 (six) hours as needed for allergies.     famotidine (PEPCID) 20 MG tablet Take 10 mg by mouth every morning.     HYDROcodone-acetaminophen (NORCO/VICODIN) 5-325 MG tablet Take 1-2 tablets by mouth every 6 (six) hours as needed for moderate pain or severe pain. 8 tablet 0    metoprolol succinate (TOPROL-XL) 25 MG 24 hr tablet TAKE 1/2 TABLET(12.5 MG) BY MOUTH DAILY 45 tablet 2   mometasone (NASONEX) 50 MCG/ACT nasal spray Place 2 sprays into the nose daily as needed (allergies).     oxybutynin (DITROPAN-XL) 10 MG 24 hr tablet Take 1 tablet (10 mg total) by mouth once a week. Take 1-2 hours before each chemotherapy 10 tablet 0   pimecrolimus (ELIDEL) 1 % cream Apply 1 application  topically daily as needed (eczema).     Polyethyl Glycol-Propyl Glycol (SYSTANE OP) Place 1 drop into both eyes 2 (two) times daily as needed (dry eyes).     triamcinolone ointment (KENALOG) 0.1 % Apply 1 Application topically daily as needed (psoriasis).     No current facility-administered medications for this visit.    PHYSICAL EXAMINATION: ECOG PERFORMANCE STATUS: 0 - Asymptomatic  Vitals:   02/10/23 1147  BP: 113/75  Pulse: (!) 58  Resp: 15  Temp: 97.7 F (36.5 C)  SpO2: 98%  Wt Readings from Last 3 Encounters:  02/10/23 199 lb 4.8 oz (90.4 kg)  01/27/23 200 lb (90.7 kg)  01/20/23 200 lb (90.7 kg)     GENERAL:alert, no distress and comfortable SKIN: skin color normal, no rashes or significant lesions EYES: normal, Conjunctiva are pink and non-injected, sclera clear  NEURO: alert & oriented x 3 with fluent speech  LABORATORY DATA:  I have reviewed the data as listed    Latest Ref Rng & Units 02/10/2023   11:32 AM 01/27/2023    8:02 AM 01/13/2023   12:06 PM  CBC  WBC 4.0 - 10.5 K/uL 6.7  5.1  7.9   Hemoglobin 13.0 - 17.0 g/dL 82.9  93.7  16.9   Hematocrit 39.0 - 52.0 % 43.1  43.3  44.2   Platelets 150 - 400 K/uL 202  178  223         Latest Ref Rng & Units 02/10/2023   11:32 AM 01/27/2023    8:02 AM 01/13/2023   12:06 PM  CMP  Glucose 70 - 99 mg/dL 94  76  98   BUN 8 - 23 mg/dL 10  14  11    Creatinine 0.61 - 1.24 mg/dL 6.78  9.38  1.01   Sodium 135 - 145 mmol/L 138  140  138   Potassium 3.5 - 5.1 mmol/L 4.3  4.0  4.6   Chloride 98 - 111 mmol/L 105  106  103    CO2 22 - 32 mmol/L 27  29  30    Calcium 8.9 - 10.3 mg/dL 9.4  9.1  9.5   Total Protein 6.5 - 8.1 g/dL 7.0  6.8  7.2   Total Bilirubin 0.3 - 1.2 mg/dL 0.5  0.5  0.5   Alkaline Phos 38 - 126 U/L 73  82  87   AST 15 - 41 U/L 20  16  17    ALT 0 - 44 U/L 13  12  12        RADIOGRAPHIC STUDIES: I have personally reviewed the radiological images as listed and agreed with the findings in the report. No results found.    No orders of the defined types were placed in this encounter.  All questions were answered. The patient knows to call the clinic with any problems, questions or concerns. No barriers to learning was detected. The total time spent in the appointment was 15 minutes.     [name], MD 02/10/2023   Carolin Coy, CMA, am acting as scribe for [name], MD.   I have reviewed the above documentation for accuracy and completeness, and I agree with the above.

## 2023-02-14 DIAGNOSIS — Z85828 Personal history of other malignant neoplasm of skin: Secondary | ICD-10-CM | POA: Diagnosis not present

## 2023-02-14 DIAGNOSIS — L578 Other skin changes due to chronic exposure to nonionizing radiation: Secondary | ICD-10-CM | POA: Diagnosis not present

## 2023-02-14 DIAGNOSIS — L821 Other seborrheic keratosis: Secondary | ICD-10-CM | POA: Diagnosis not present

## 2023-02-14 DIAGNOSIS — L570 Actinic keratosis: Secondary | ICD-10-CM | POA: Diagnosis not present

## 2023-02-14 DIAGNOSIS — L409 Psoriasis, unspecified: Secondary | ICD-10-CM | POA: Diagnosis not present

## 2023-02-14 DIAGNOSIS — D225 Melanocytic nevi of trunk: Secondary | ICD-10-CM | POA: Diagnosis not present

## 2023-02-14 DIAGNOSIS — L814 Other melanin hyperpigmentation: Secondary | ICD-10-CM | POA: Diagnosis not present

## 2023-02-17 ENCOUNTER — Inpatient Hospital Stay: Payer: PPO

## 2023-02-17 VITALS — BP 134/93 | HR 55 | Temp 97.6°F | Resp 16

## 2023-02-17 DIAGNOSIS — C678 Malignant neoplasm of overlapping sites of bladder: Secondary | ICD-10-CM

## 2023-02-17 DIAGNOSIS — Z5111 Encounter for antineoplastic chemotherapy: Secondary | ICD-10-CM | POA: Diagnosis not present

## 2023-02-17 MED ORDER — LIDOCAINE HCL URETHRAL/MUCOSAL 2 % EX GEL
1.0000 | Freq: Once | CUTANEOUS | Status: AC
  Administered 2023-02-17: 1 via URETHRAL
  Filled 2023-02-17: qty 1

## 2023-02-17 MED ORDER — PROCHLORPERAZINE MALEATE 10 MG PO TABS
10.0000 mg | ORAL_TABLET | Freq: Once | ORAL | Status: AC
  Administered 2023-02-17: 10 mg via ORAL
  Filled 2023-02-17: qty 1

## 2023-02-17 MED ORDER — OXYBUTYNIN CHLORIDE 5 MG PO TABS: 5.0000 mg | ORAL_TABLET | Freq: Once | ORAL | Status: DC | PRN

## 2023-02-17 MED ORDER — GEMCITABINE CHEMO FOR BLADDER INSTILLATION 2000 MG
2000.0000 mg | Freq: Once | INTRAVENOUS | Status: AC
  Administered 2023-02-17: 2000 mg via INTRAVESICAL
  Filled 2023-02-17: qty 52.6

## 2023-02-17 NOTE — Progress Notes (Signed)
18G Foley catheter inserted. 30cc balloon inflated. 100 ml urine output obtained prior to chemo. Pt took home Ditropan along with compazine prior to foley insertion. Catheter clamped. Chemo administered over 10 minutes. Pt rotated back, left side, right side, and abdomen every 15 minutes. Total dwell time 60 minutes. Pt tolerated well with no leakage. Catheter unclamped and drained for 30 minutes with a total of 300 ml output post chemotherapy dwell. 30 ml balloon deflated and catheter removed.   VSS and pt discharged with wife in stable condition.

## 2023-02-17 NOTE — Patient Instructions (Signed)
[facility]  Discharge Instructions: Thank you for choosing [facility] to provide your oncology and hematology care.   If you have a lab appointment with the Cancer Center, please go directly to the Cancer Center and check in at the registration area.   Wear comfortable clothing and clothing appropriate for easy access to any Portacath or PICC line.   We strive to give you quality time with your provider. You may need to reschedule your appointment if you arrive late (15 or more minutes).  Arriving late affects you and other patients whose appointments are after yours.  Also, if you miss three or more appointments without notifying the office, you may be dismissed from the clinic at the provider's discretion.      For prescription refill requests, have your pharmacy contact our office and allow 72 hours for refills to be completed.    Today you received the following chemotherapy and/or immunotherapy agents: Gemzar      To help prevent nausea and vomiting after your treatment, we encourage you to take your nausea medication as directed.  BELOW ARE SYMPTOMS THAT SHOULD BE REPORTED IMMEDIATELY: *FEVER GREATER THAN 100.4 F (38 C) OR HIGHER *CHILLS OR SWEATING *NAUSEA AND VOMITING THAT IS NOT CONTROLLED WITH YOUR NAUSEA MEDICATION *UNUSUAL SHORTNESS OF BREATH *UNUSUAL BRUISING OR BLEEDING *URINARY PROBLEMS (pain or burning when urinating, or frequent urination) *BOWEL PROBLEMS (unusual diarrhea, constipation, pain near the anus) TENDERNESS IN MOUTH AND THROAT WITH OR WITHOUT PRESENCE OF ULCERS (sore throat, sores in mouth, or a toothache) UNUSUAL RASH, SWELLING OR PAIN  UNUSUAL VAGINAL DISCHARGE OR ITCHING   Items with * indicate a potential emergency and should be followed up as soon as possible or go to the Emergency Department if any problems should occur.  Please show the CHEMOTHERAPY ALERT CARD or IMMUNOTHERAPY ALERT CARD at check-in  to the Emergency Department and triage nurse.  Should you have questions after your visit or need to cancel or reschedule your appointment, please contact [facility]  Dept: [phone]  and follow the prompts.  Office hours are 8:00 a.m. to 4:30 p.m. Monday - Friday. Please note that voicemails left after 4:00 p.m. may not be returned until the following business day.  We are closed weekends and major holidays. You have access to a nurse at all times for urgent questions. Please call the main number to the clinic Dept: [phone] and follow the prompts.   For any non-urgent questions, you may also contact your provider using MyChart. We now offer e-Visits for anyone 18 and older to request care online for non-urgent symptoms. For details visit [email or web address].   Also download the MyChart app! Go to the app store, search "MyChart", open the app, select [place], and log in with your MyChart username and password.   

## 2023-02-23 DIAGNOSIS — C678 Malignant neoplasm of overlapping sites of bladder: Secondary | ICD-10-CM | POA: Diagnosis not present

## 2023-02-23 DIAGNOSIS — Z0000 Encounter for general adult medical examination without abnormal findings: Secondary | ICD-10-CM | POA: Diagnosis not present

## 2023-02-23 DIAGNOSIS — Z1159 Encounter for screening for other viral diseases: Secondary | ICD-10-CM | POA: Diagnosis not present

## 2023-02-23 DIAGNOSIS — R7303 Prediabetes: Secondary | ICD-10-CM | POA: Diagnosis not present

## 2023-02-23 DIAGNOSIS — E782 Mixed hyperlipidemia: Secondary | ICD-10-CM | POA: Diagnosis not present

## 2023-02-23 DIAGNOSIS — I712 Thoracic aortic aneurysm, without rupture, unspecified: Secondary | ICD-10-CM | POA: Diagnosis not present

## 2023-02-23 DIAGNOSIS — Z125 Encounter for screening for malignant neoplasm of prostate: Secondary | ICD-10-CM | POA: Diagnosis not present

## 2023-02-23 DIAGNOSIS — F330 Major depressive disorder, recurrent, mild: Secondary | ICD-10-CM | POA: Diagnosis not present

## 2023-02-23 NOTE — Progress Notes (Signed)
[facility]   Telephone:(336) [phone] Fax:(336) [phone]   Clinic Follow up Note   Patient Care Team: [name], MD as PCP - General (Family Medicine) [name], MD as PCP - Cardiology (Cardiology) [name], MD as Attending Physician (Hematology and Oncology) [name], MD as Consulting Physician (Urology)  Date of Service:  02/24/2023  CHIEF COMPLAINT: f/u of  Recurrent bladder cancer   CURRENT THERAPY: Intravesical gemcitabine (2000) q7d   ASSESSMENT:  Jeremy Day is a 72 y.o. 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 -Status post BCG maintenance therapy, and bladder instillation of gemcitabine with TURBT twice -Given the high risk disease, and multiple recurrence, I recommend intravesical gemcitabine weekly for 6 treatments. -he started on 01/20/23, he is tolerating well  -lab reviewed, agree for treatment, will proceed week 6 treatment today He is scheduled to see [name] with cystoscopy next week to evaluate his response to treatment. -We discussed we can repeat intravesical gemcitabine in the future if he has recurrence, will consider immunotherapy for recurrence. -I will see him as needed.    PLAN: -lab reviewed -proceed with C6 Intravesical gemcitabine today which is the last scheduled treatment -Scheduled to see that [name] next week -Follow-up with me as needed  SUMMARY OF ONCOLOGIC HISTORY: Oncology History  Malignant neoplasm of overlapping sites of bladder  01/13/2023 Initial Diagnosis   Malignant neoplasm of overlapping sites of bladder (HCC)   01/20/2023 -  Chemotherapy   Patient is on Treatment Plan : BLADDER Gemcitabine INTRAVESICAL (2000) q7d        INTERVAL HISTORY:  Jeremy Day is  here for a follow up of  Recurrent bladder cancer .He was last seen by  me on 02/10/2023. He presents to the clinic accompanied by wife.patient is doing very well clinically, and has been tolerating intravesical gemcitabine well without spasm or other side effects.  He denies any new symptoms.     All other systems were reviewed with the patient and are negative.  MEDICAL HISTORY:  Past Medical History:  Diagnosis Date   Abnormal tandem walk    using 2 canes due to lower back arthritis   Ascending aorta dilatation    echo  04-11-2016  38mm and dilated aortc root 39mm   Bladder tumor    BPH (benign prostatic hyperplasia)    Cancer 2020   bladder   Depression    Diverticulosis    Full dentures    GERD (gastroesophageal reflux disease)    Hiatal hernia    History of colon polyps    History of staphylococcal infection 2008   right ankle  w/ sepsis   Meningitis spinal age 15 or 4   OA (osteoarthritis)    lower back, right ankle,, bilateral knees and shouldres   Pneumonia yrs ago   Pre-diabetes    Psoriasis    PVC's (premature ventricular contractions)    2012-- hx bigeminy/ trigeminy with near syncope (cardiology consult note in epic dated 2012)  (last cardiology visit in epic w/ dr Anne Fu, 03-27-2016)   Seasonal allergies    Urgency of urination     SURGICAL HISTORY: Past Surgical History:  Procedure Laterality Date   ANKLE ARTHROSCOPY Right 02-13-2007    dr Luiz Blare  w/ debridement and removal loose body   COLONOSCOPY  last one summer 2019   INGUINAL HERNIA REPAIR Right 1980s   INGUINAL LYMPH NODE BIOPSY  2001  approx.   benign   SPINAL FUSION     "T8 through sacral area".   TONSILLECTOMY     TRANSURETHRAL RESECTION OF BLADDER TUMOR N/A 02/06/2019   Procedure: TRANSURETHRAL RESECTION OF BLADDER TUMOR (TURBT);  Surgeon: [name], MD;  [facility];  Service: Urology;  Laterality: N/A;   TRANSURETHRAL RESECTION OF BLADDER TUMOR N/A 02/20/2019   Procedure:  TRANSURETHRAL RESECTION OF BLADDER TUMOR (TURBT);  Surgeon: [name], MD;  [facility];  Service: Urology;  Laterality: N/A;   TRANSURETHRAL RESECTION OF BLADDER TUMOR N/A 03/13/2019   Procedure: TRANSURETHRAL RESECTION OF BLADDER TUMOR (TURBT);  Surgeon: [name], MD;  [facility];  Service: Urology;  Laterality: N/A;   TRANSURETHRAL RESECTION OF BLADDER TUMOR N/A 11/20/2020   Procedure: TRANSURETHRAL RESECTION OF BLADDER TUMOR (TURBT) WITH INSTILLATION OF post-op GEMCITABINE;  Surgeon: Jannifer Hick, MD;  Location: [facility];  Service: Urology;  Laterality: N/A;   TRANSURETHRAL RESECTION OF BLADDER TUMOR WITH MITOMYCIN-C N/A 09/23/2019   Procedure: TRANSURETHRAL RESECTION OF BLADDER TUMOR WITH GEMCITABINE INSTILLATION INTO THE BLADDER;  Surgeon: [name], MD;  [facility];  Service: Urology;  Laterality: N/A;   TRANSURETHRAL RESECTION OF BLADDER TUMOR WITH MITOMYCIN-C N/A 06/24/2020   Procedure: TRANSURETHRAL RESECTION OF BLADDER TUMOR WITH POST OPERATIVE GEMCITABINE INSTILLATION;  Surgeon: [name], MD;  Location: [facility];  Service: Urology;  Laterality: N/A;   TRANSURETHRAL RESECTION OF BLADDER TUMOR WITH MITOMYCIN-C Bilateral 11/21/2022   Procedure: CYSTOSCOPY BILATERAL RETROGRADE PYELOGRAM TRANSURETHRAL RESECTION OF BLADDER TUMOR WITH GEMCITABINE;  Surgeon: [name], MD;  [facility];  Service: Urology;  Laterality: Bilateral;  1 HR FOR CASE    I have reviewed the social history and family history with the patient and they are unchanged from previous note.  ALLERGIES:  has No Known Allergies.  MEDICATIONS:  Current Outpatient Medications  Medication Sig Dispense Refill   acetaminophen (TYLENOL) 500 MG tablet Take 1,000 mg by mouth every 6 (six) hours as needed for moderate pain.     calcium carbonate (TUMS - DOSED IN MG ELEMENTAL CALCIUM) 500 MG chewable tablet Chew  1 tablet by mouth daily as needed for indigestion or heartburn.     diphenhydrAMINE (BENADRYL) 25 MG tablet Take 25 mg by mouth every 6 (six) hours as needed for allergies.     famotidine (PEPCID) 20 MG tablet Take 10 mg by mouth every morning.     HYDROcodone-acetaminophen (NORCO/VICODIN) 5-325 MG tablet Take 1-2 tablets by mouth every 6 (six) hours as needed for moderate pain or severe pain. 8 tablet 0   metoprolol succinate (TOPROL-XL) 25 MG 24 hr tablet TAKE 1/2 TABLET(12.5 MG) BY MOUTH DAILY 45 tablet 2   mometasone (NASONEX) 50 MCG/ACT nasal spray Place 2 sprays into the nose daily as needed (allergies).     oxybutynin (DITROPAN-XL) 10 MG 24 hr tablet Take 1 tablet (10 mg total) by mouth once a week. Take 1-2 hours before each chemotherapy 10 tablet 0   pimecrolimus (ELIDEL) 1 % cream Apply 1 application  topically daily as needed (eczema).     Polyethyl Glycol-Propyl Glycol (SYSTANE OP) Place 1 drop into both eyes 2 (two) times daily as needed (dry eyes).  triamcinolone ointment (KENALOG) 0.1 % Apply 1 Application topically daily as needed (psoriasis).     No current facility-administered medications for this visit.    PHYSICAL EXAMINATION: ECOG PERFORMANCE STATUS: 0 - Asymptomatic  Vitals:   02/24/23 0855  BP: 117/82  Pulse: (!) 52  Resp: 16  Temp: 97.8 F (36.6 C)  SpO2: 98%    Wt Readings from Last 3 Encounters:  02/24/23 198 lb 6.4 oz (90 kg)  02/10/23 199 lb 4.8 oz (90.4 kg)  01/27/23 200 lb (90.7 kg)     GENERAL:alert, no distress and comfortable SKIN: skin color normal, no rashes or significant lesions EYES: normal, Conjunctiva are pink and non-injected, sclera clear  NEURO: alert & oriented x 3 with fluent speech  LABORATORY DATA:  I have reviewed the data as listed    Latest Ref Rng & Units 02/24/2023    8:07 AM 02/10/2023   11:32 AM 01/27/2023    8:02 AM  CBC  WBC 4.0 - 10.5 K/uL 5.3  6.7  5.1   Hemoglobin 13.0 - 17.0 g/dL 16.1  09.6  04.5   Hematocrit  39.0 - 52.0 % 45.0  43.1  43.3   Platelets 150 - 400 K/uL 164  202  178         Latest Ref Rng & Units 02/24/2023    8:07 AM 02/10/2023   11:32 AM 01/27/2023    8:02 AM  CMP  Glucose 70 - 99 mg/dL 409  94  76   BUN 8 - 23 mg/dL Creatinine 0.61 - 1.24 mg/dL 8.11  9.14  7.82   Sodium 135 - 145 mmol/L 137  138  140   Potassium 3.5 - 5.1 mmol/L 3.9  4.3  4.0   Chloride 98 - 111 mmol/L 106  105  106   CO2 22 - 32 mmol/L Calcium 8.9 - 10.3 mg/dL 9.4  9.4  9.1   Total Protein 6.5 - 8.1 g/dL 6.9  7.0  6.8   Total Bilirubin 0.3 - 1.2 mg/dL 0.6  0.5  0.5   Alkaline Phos 38 - 126 U/L 76  73  82   AST 15 - 41 U/L ALT 0 - 44 U/L RADIOGRAPHIC STUDIES: I have personally reviewed the radiological images as listed and agreed with the findings in the report. No results found.    No orders of the defined types were placed in this encounter.  All questions were answered. The patient knows to call the clinic with any problems, questions or concerns. No barriers to learning was detected. The total time spent in the appointment was 15 minutes.     [name], MD 02/24/2023

## 2023-02-24 ENCOUNTER — Encounter: Payer: Self-pay | Admitting: Hematology

## 2023-02-24 ENCOUNTER — Inpatient Hospital Stay: Payer: PPO

## 2023-02-24 ENCOUNTER — Other Ambulatory Visit: Payer: Self-pay

## 2023-02-24 ENCOUNTER — Inpatient Hospital Stay (HOSPITAL_BASED_OUTPATIENT_CLINIC_OR_DEPARTMENT_OTHER): Payer: PPO | Admitting: Hematology

## 2023-02-24 VITALS — BP 122/78 | HR 51 | Temp 97.7°F | Resp 16

## 2023-02-24 VITALS — BP 117/82 | HR 52 | Temp 97.8°F | Resp 16 | Wt 198.4 lb

## 2023-02-24 DIAGNOSIS — C678 Malignant neoplasm of overlapping sites of bladder: Secondary | ICD-10-CM

## 2023-02-24 DIAGNOSIS — Z5111 Encounter for antineoplastic chemotherapy: Secondary | ICD-10-CM | POA: Diagnosis not present

## 2023-02-24 LAB — CBC WITH DIFFERENTIAL (CANCER CENTER ONLY)
Abs Immature Granulocytes: 0.03 10*3/uL (ref 0.00–0.07)
Basophils Absolute: 0 10*3/uL (ref 0.0–0.1)
Basophils Relative: 1 %
Eosinophils Absolute: 0.1 10*3/uL (ref 0.0–0.5)
Eosinophils Relative: 2 %
HCT: 45 % (ref 39.0–52.0)
Hemoglobin: 14.9 g/dL (ref 13.0–17.0)
Immature Granulocytes: 1 %
Lymphocytes Relative: 25 %
Lymphs Abs: 1.3 10*3/uL (ref 0.7–4.0)
MCH: 31.6 pg (ref 26.0–34.0)
MCHC: 33.1 g/dL (ref 30.0–36.0)
MCV: 95.5 fL (ref 80.0–100.0)
Monocytes Absolute: 0.9 10*3/uL (ref 0.1–1.0)
Monocytes Relative: 17 %
Neutro Abs: 2.9 10*3/uL (ref 1.7–7.7)
Neutrophils Relative %: 54 %
Platelet Count: 164 10*3/uL (ref 150–400)
RBC: 4.71 MIL/uL (ref 4.22–5.81)
RDW: 17.1 % — ABNORMAL HIGH (ref 11.5–15.5)
WBC Count: 5.3 10*3/uL (ref 4.0–10.5)
nRBC: 0 % (ref 0.0–0.2)

## 2023-02-24 LAB — CMP (CANCER CENTER ONLY)
ALT: 13 U/L (ref 0–44)
AST: 18 U/L (ref 15–41)
Albumin: 4 g/dL (ref 3.5–5.0)
Alkaline Phosphatase: 76 U/L (ref 38–126)
Anion gap: 4 — ABNORMAL LOW (ref 5–15)
BUN: 10 mg/dL (ref 8–23)
CO2: 27 mmol/L (ref 22–32)
Calcium: 9.4 mg/dL (ref 8.9–10.3)
Chloride: 106 mmol/L (ref 98–111)
Creatinine: 0.87 mg/dL (ref 0.61–1.24)
GFR, Estimated: >60 mL/min (ref >60)
Glucose, Bld: 104 mg/dL — ABNORMAL HIGH (ref 70–99)
Potassium: 3.9 mmol/L (ref 3.5–5.1)
Sodium: 137 mmol/L (ref 135–145)
Total Bilirubin: 0.6 mg/dL (ref 0.3–1.2)
Total Protein: 6.9 g/dL (ref 6.5–8.1)

## 2023-02-24 MED ORDER — PROCHLORPERAZINE MALEATE 10 MG PO TABS
10.0000 mg | ORAL_TABLET | Freq: Once | ORAL | Status: AC
  Administered 2023-02-24: 10 mg via ORAL
  Filled 2023-02-24: qty 1

## 2023-02-24 MED ORDER — GEMCITABINE CHEMO FOR BLADDER INSTILLATION 2000 MG
2000.0000 mg | Freq: Once | INTRAVENOUS | Status: AC
  Administered 2023-02-24: 2000 mg via INTRAVESICAL
  Filled 2023-02-24: qty 52.6

## 2023-02-24 MED ORDER — LIDOCAINE HCL URETHRAL/MUCOSAL 2 % EX GEL
1.0000 | Freq: Once | CUTANEOUS | Status: AC
  Administered 2023-02-24: 1 via URETHRAL
  Filled 2023-02-24: qty 1

## 2023-02-24 NOTE — Progress Notes (Signed)
Dr. Latanya Maudlin office note faxed to Dr. Modena Slater w/[facility] [phone]  [phone]).  Fax confirmation received.

## 2023-02-24 NOTE — Patient Instructions (Signed)
[facility]  Discharge Instructions: Thank you for choosing [facility] to provide your oncology and hematology care.   If you have a lab appointment with the Cancer Center, please go directly to the Cancer Center and check in at the registration area.   Wear comfortable clothing and clothing appropriate for easy access to any Portacath or PICC line.   We strive to give you quality time with your provider. You may need to reschedule your appointment if you arrive late (15 or more minutes).  Arriving late affects you and other patients whose appointments are after yours.  Also, if you miss three or more appointments without notifying the office, you may be dismissed from the clinic at the provider's discretion.      For prescription refill requests, have your pharmacy contact our office and allow 72 hours for refills to be completed.    Today you received the following chemotherapy and/or immunotherapy agents: Gemzar      To help prevent nausea and vomiting after your treatment, we encourage you to take your nausea medication as directed.  BELOW ARE SYMPTOMS THAT SHOULD BE REPORTED IMMEDIATELY: *FEVER GREATER THAN 100.4 F (38 C) OR HIGHER *CHILLS OR SWEATING *NAUSEA AND VOMITING THAT IS NOT CONTROLLED WITH YOUR NAUSEA MEDICATION *UNUSUAL SHORTNESS OF BREATH *UNUSUAL BRUISING OR BLEEDING *URINARY PROBLEMS (pain or burning when urinating, or frequent urination) *BOWEL PROBLEMS (unusual diarrhea, constipation, pain near the anus) TENDERNESS IN MOUTH AND THROAT WITH OR WITHOUT PRESENCE OF ULCERS (sore throat, sores in mouth, or a toothache) UNUSUAL RASH, SWELLING OR PAIN  UNUSUAL VAGINAL DISCHARGE OR ITCHING   Items with * indicate a potential emergency and should be followed up as soon as possible or go to the Emergency Department if any problems should occur.  Please show the CHEMOTHERAPY ALERT CARD or IMMUNOTHERAPY ALERT CARD at check-in  to the Emergency Department and triage nurse.  Should you have questions after your visit or need to cancel or reschedule your appointment, please contact [facility]  Dept: [phone]  and follow the prompts.  Office hours are 8:00 a.m. to 4:30 p.m. Monday - Friday. Please note that voicemails left after 4:00 p.m. may not be returned until the following business day.  We are closed weekends and major holidays. You have access to a nurse at all times for urgent questions. Please call the main number to the clinic Dept: [phone] and follow the prompts.   For any non-urgent questions, you may also contact your provider using MyChart. We now offer e-Visits for anyone 18 and older to request care online for non-urgent symptoms. For details visit [email or web address].   Also download the MyChart app! Go to the app store, search "MyChart", open the app, select [place], and log in with your MyChart username and password.   

## 2023-02-24 NOTE — Progress Notes (Signed)
51F Foley catheter inserted at 930am. 30cc balloon inflated. 200cc urine output obtained prior to chemo. Pt took home Ditropan along with compazine prior to foley insertion. Catheter clamped. Chemo administered over 10 minutes. Pt rotated back, left side, right side, and abdomen every 15 minutes. Total dwell time 60 minutes. Pt tolerated well with no leakage. Catheter unclamped at 11:15 and drained for 30 minutes with a total of 300cc output post chemotherapy dwell. 30cc balloon deflated and catheter removed.   VSS and pt discharged in stable condition.

## 2023-03-02 DIAGNOSIS — C678 Malignant neoplasm of overlapping sites of bladder: Secondary | ICD-10-CM | POA: Diagnosis not present

## 2023-03-02 DIAGNOSIS — N401 Enlarged prostate with lower urinary tract symptoms: Secondary | ICD-10-CM | POA: Diagnosis not present

## 2023-03-02 DIAGNOSIS — R351 Nocturia: Secondary | ICD-10-CM | POA: Diagnosis not present

## 2023-03-03 ENCOUNTER — Other Ambulatory Visit: Payer: Self-pay

## 2023-03-20 ENCOUNTER — Telehealth: Payer: Self-pay | Admitting: *Deleted

## 2023-03-20 NOTE — Telephone Encounter (Signed)
   Pre-operative Risk Assessment    Patient Name: Jeremy Day  DOB: December 01, 1950 MRN: [id number]      Request for Surgical Clearance    Procedure:   POSTERIOR L1-ILIUM REVISION OF PRIOR FUSION WITH O-ARM/STEALTH NAV  Date of Surgery:  Clearance 04/26/23                                 Surgeon:  DR. Tye [place] Surgeon's Group or Practice Name:  [facility] Phone number:  [phone] Fax number:  [phone]   Type of Clearance Requested:   - Medical ; NO MEDICATIONS LISTED AS NEEDING TO BE HELD   Type of Anesthesia:  Not Indicated (GENERAL?)   Additional requests/questions:    Elpidio Anis   03/20/2023, 5:28 PM

## 2023-03-21 NOTE — Telephone Encounter (Signed)
I left a message for the patient to call our office to schedule a tele visit for pre-op 

## 2023-03-21 NOTE — Telephone Encounter (Signed)
   Name: JETHRO EADEN  DOB: 1951/07/27  MRN: [id number]  Primary Cardiologist: [name], MD  Chart reviewed as part of pre-operative protocol coverage. Because of Kemuel Arn Diefendorf's past medical history and time since last visit, he will require a follow-up telephone visit in order to better assess preoperative cardiovascular risk.  Pre-op covering staff: - Please schedule appointment and call patient to inform them. If patient already had an upcoming appointment within acceptable timeframe, please add "pre-op clearance" to the appointment notes so provider is aware. - Please contact requesting surgeon's office via preferred method (i.e, phone, fax) to inform them of need for appointment prior to surgery.  No medications indicated as needing held.    Sharlene Dory, PA-C  03/21/2023, 8:29 AM

## 2023-03-22 ENCOUNTER — Telehealth: Payer: Self-pay | Admitting: *Deleted

## 2023-03-22 NOTE — Telephone Encounter (Signed)
Left message on machine for pt to contact the office and schedule tele clearance.

## 2023-03-22 NOTE — Telephone Encounter (Signed)
Set up telephone clearance. Med rec and consent done.     Patient Consent for Virtual Visit         Jeremy Day has provided verbal consent on 03/22/2023 for a virtual visit (video or telephone).   CONSENT FOR VIRTUAL VISIT FOR:  Jeremy Day  By participating in this virtual visit I agree to the following:  I hereby voluntarily request, consent and authorize [facility] and its employed or contracted physicians, physician assistants, nurse practitioners or other licensed health care professionals (the Practitioner), to provide me with telemedicine health care services (the "Services") as deemed necessary by the treating Practitioner. I acknowledge and consent to receive the Services by the Practitioner via telemedicine. I understand that the telemedicine visit will involve communicating with the Practitioner through live audiovisual communication technology and the disclosure of certain medical information by electronic transmission. I acknowledge that I have been given the opportunity to request an in-person assessment or other available alternative prior to the telemedicine visit and am voluntarily participating in the telemedicine visit.  I understand that I have the right to withhold or withdraw my consent to the use of telemedicine in the course of my care at any time, without affecting my right to future care or treatment, and that the Practitioner or I may terminate the telemedicine visit at any time. I understand that I have the right to inspect all information obtained and/or recorded in the course of the telemedicine visit and may receive copies of available information for a reasonable fee.  I understand that some of the potential risks of receiving the Services via telemedicine include:  Delay or interruption in medical evaluation due to technological equipment failure or disruption; Information transmitted may not be sufficient (e.g. poor resolution of images) to allow for  appropriate medical decision making by the Practitioner; and/or  In rare instances, security protocols could fail, causing a breach of personal health information.  Furthermore, I acknowledge that it is my responsibility to provide information about my medical history, conditions and care that is complete and accurate to the best of my ability. I acknowledge that Practitioner's advice, recommendations, and/or decision may be based on factors not within their control, such as incomplete or inaccurate data provided by me or distortions of diagnostic images or specimens that may result from electronic transmissions. I understand that the practice of medicine is not an exact science and that Practitioner makes no warranties or guarantees regarding treatment outcomes. I acknowledge that a copy of this consent can be made available to me via my patient portal [facility] MyChart), or I can request a printed copy by calling the office of [facility].    I understand that my insurance will be billed for this visit.   I have read or had this consent read to me. I understand the contents of this consent, which adequately explains the benefits and risks of the Services being provided via telemedicine.  I have been provided ample opportunity to ask questions regarding this consent and the Services and have had my questions answered to my satisfaction. I give my informed consent for the services to be provided through the use of telemedicine in my medical care

## 2023-03-23 ENCOUNTER — Other Ambulatory Visit: Payer: Self-pay

## 2023-04-04 ENCOUNTER — Ambulatory Visit: Payer: PPO | Attending: Cardiology

## 2023-04-04 DIAGNOSIS — Z01810 Encounter for preprocedural cardiovascular examination: Secondary | ICD-10-CM | POA: Diagnosis not present

## 2023-04-04 NOTE — Progress Notes (Addendum)
Virtual Visit via Telephone Note   Because of Jeremy Day's co-morbid illnesses, he is at least at moderate risk for complications without adequate follow up.  This format is felt to be most appropriate for this patient at this time.  The patient did not have access to video technology/had technical difficulties with video requiring transitioning to audio format only (telephone).  All issues noted in this document were discussed and addressed.  No physical exam could be performed with this format.  Please refer to the patient's chart for his consent to telehealth for [facility].  Evaluation Performed:  Preoperative cardiovascular risk assessment _____________   Date:  04/04/2023   Patient ID:  Jeremy Day, DOB 05-Mar-1951, MRN [id number] Patient Location:  Home Provider location:   Office  Primary Care Provider:  [name], MD Primary Cardiologist:  [name], MD  Chief Complaint / Patient Profile   72 y.o. 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  History of Present Illness    Jeremy Day is a 72 y.o. male who presents via audio/video conferencing for a telehealth visit today.  Pt was last seen in cardiology clinic on 11/16/2022 by Robin Searing, NP.  At that time STEFEN JAQUAY was doing well with no new cardiac complaints .  The patient is now pending procedure as outlined above. Since his last visit, he reports doing well with no new cardiac complaints.  He does report some fatigue since beginning Toprol-XL.  He was advised to take in the evenings to offset some of the fatigue.  He denies chest pain, shortness of breath, lower extremity edema, fatigue, palpitations, melena, hematuria, hemoptysis, diaphoresis, weakness, presyncope, syncope,  orthopnea, and PND.   None  Past Medical History    Past Medical History:  Diagnosis Date   Abnormal tandem walk    using 2 canes due to lower back arthritis   Ascending aorta dilatation (HCC)    echo  04-11-2016  38mm and dilated aortc root 39mm   Bladder tumor    BPH (benign prostatic hyperplasia)    Cancer (HCC) 2020   bladder   Depression    Diverticulosis    Full dentures    GERD (gastroesophageal reflux disease)    Hiatal hernia    History of colon polyps    History of staphylococcal infection 2008   right ankle  w/ sepsis   Meningitis spinal age 72 or 72   OA (osteoarthritis)    lower back, right ankle,, bilateral knees and shouldres   Pneumonia yrs ago   Pre-diabetes    Psoriasis    PVC's (premature ventricular contractions)    2012-- hx bigeminy/ trigeminy with near syncope (cardiology consult note in epic dated 2012)  (last cardiology visit in epic w/ dr Anne Fu, 03-27-2016)   Seasonal allergies    Urgency of urination    Past Surgical History:  Procedure Laterality Date   ANKLE ARTHROSCOPY Right 02-13-2007    dr Luiz Blare   w/ debridement and removal loose body   COLONOSCOPY  last one summer 2019   INGUINAL HERNIA REPAIR Right 1980s   INGUINAL LYMPH NODE BIOPSY  2001  approx.   benign   SPINAL FUSION     "T8 through sacral area".   TONSILLECTOMY     TRANSURETHRAL  RESECTION OF BLADDER TUMOR N/A 02/06/2019   Procedure: TRANSURETHRAL RESECTION OF BLADDER TUMOR (TURBT);  Surgeon: [name], MD;  Location: [facility];  Service: Urology;  Laterality: N/A;   TRANSURETHRAL RESECTION OF BLADDER TUMOR N/A 02/20/2019   Procedure: TRANSURETHRAL RESECTION OF BLADDER TUMOR (TURBT);  Surgeon: [name], MD;  Location: [facility];  Service: Urology;  Laterality: N/A;   TRANSURETHRAL RESECTION OF BLADDER TUMOR N/A 03/13/2019   Procedure: TRANSURETHRAL RESECTION OF BLADDER TUMOR (TURBT);  Surgeon: [name], MD;  Location: [facility];  Service: Urology;   Laterality: N/A;   TRANSURETHRAL RESECTION OF BLADDER TUMOR N/A 11/20/2020   Procedure: TRANSURETHRAL RESECTION OF BLADDER TUMOR (TURBT) WITH INSTILLATION OF post-op GEMCITABINE;  Surgeon: Jannifer Hick, MD;  Location: [facility];  Service: Urology;  Laterality: N/A;   TRANSURETHRAL RESECTION OF BLADDER TUMOR WITH MITOMYCIN-C N/A 09/23/2019   Procedure: TRANSURETHRAL RESECTION OF BLADDER TUMOR WITH GEMCITABINE INSTILLATION INTO THE BLADDER;  Surgeon: [name], MD;  Location: [facility];  Service: Urology;  Laterality: N/A;   TRANSURETHRAL RESECTION OF BLADDER TUMOR WITH MITOMYCIN-C N/A 06/24/2020   Procedure: TRANSURETHRAL RESECTION OF BLADDER TUMOR WITH POST OPERATIVE GEMCITABINE INSTILLATION;  Surgeon: [name], MD;  Location: [facility];  Service: Urology;  Laterality: N/A;   TRANSURETHRAL RESECTION OF BLADDER TUMOR WITH MITOMYCIN-C Bilateral 11/21/2022   Procedure: CYSTOSCOPY BILATERAL RETROGRADE PYELOGRAM TRANSURETHRAL RESECTION OF BLADDER TUMOR WITH GEMCITABINE;  Surgeon: [name], MD;  Location: [facility];  Service: Urology;  Laterality: Bilateral;  1 HR FOR CASE    Allergies  No Known Allergies  Home Medications    Prior to Admission medications   Medication Sig Start Date End Date Taking? Authorizing Provider  acetaminophen (TYLENOL) 500 MG tablet Take 1,000 mg by mouth every 6 (six) hours as needed for moderate pain.    [provider]  calcium carbonate (TUMS - DOSED IN MG ELEMENTAL CALCIUM) 500 MG chewable tablet Chew 1 tablet by mouth daily as needed for indigestion or heartburn.    [provider]  diphenhydrAMINE (BENADRYL) 25 MG tablet Take 25 mg by mouth every 6 (six) hours as needed for allergies.    [provider]  famotidine (PEPCID) 20 MG tablet Take 10 mg by mouth every morning.    [provider]  HYDROcodone-acetaminophen (NORCO/VICODIN) 5-325 MG tablet Take  1-2 tablets by mouth every 6 (six) hours as needed for moderate pain or severe pain. 11/21/22   Ray Church III, MD  metoprolol succinate (TOPROL-XL) 25 MG 24 hr tablet TAKE 1/2 TABLET(12.5 MG) BY MOUTH DAILY 12/12/22   Alver Sorrow, NP  mometasone (NASONEX) 50 MCG/ACT nasal spray Place 2 sprays into the nose daily as needed (allergies).    [provider]  pimecrolimus (ELIDEL) 1 % cream Apply 1 application  topically daily as needed (eczema).    [provider]  Polyethyl Glycol-Propyl Glycol (SYSTANE OP) Place 1 drop into both eyes 2 (two) times daily as needed (dry eyes).    [provider]  tamsulosin (FLOMAX) 0.4 MG CAPS capsule Take 0.4 mg by mouth daily. 03/03/23   [provider]  triamcinolone ointment (KENALOG) 0.1 % Apply 1 Application topically daily as needed (psoriasis). 11/26/21   [provider]    Physical Exam    Vital Signs:  DEMON HOTOP does not have vital signs available for review today.  Given telephonic nature  of communication, physical exam is limited. AAOx3. NAD. Normal affect.  Speech and respirations are unlabored.  Accessory Clinical Findings    None  Assessment & Plan    1.  Preoperative Cardiovascular Risk Assessment:   RCRI: 0.9%  The patient affirms he has been doing well without any new cardiac symptoms. They are able to achieve 5 METS without cardiac limitations. Therefore, based on ACC/AHA guidelines, the patient would be at acceptable risk for the planned procedure without further cardiovascular testing. The patient was advised that if he develops new symptoms prior to surgery to contact our office to arrange for a follow-up visit, and he verbalized understanding.   The patient was advised that if he develops new symptoms prior to surgery to contact our office to arrange for a follow-up visit, and he verbalized understanding.  (Reminder: Include SBE prophylaxis/Antiplatelet/Anticoag  Instructions)  A copy of this note will be routed to requesting surgeon.  Time:   Today, I have spent 6 minutes with the patient with telehealth technology discussing medical history, symptoms, and management plan.     Napoleon Form, Leodis Rains, NP  04/04/2023, 6:59 AM

## 2023-04-09 ENCOUNTER — Other Ambulatory Visit: Payer: Self-pay

## 2023-04-26 DIAGNOSIS — T84296A Other mechanical complication of internal fixation device of vertebrae, initial encounter: Secondary | ICD-10-CM | POA: Diagnosis not present

## 2023-04-26 DIAGNOSIS — Z8551 Personal history of malignant neoplasm of bladder: Secondary | ICD-10-CM | POA: Diagnosis not present

## 2023-04-26 DIAGNOSIS — I7121 Aneurysm of the ascending aorta, without rupture: Secondary | ICD-10-CM | POA: Diagnosis not present

## 2023-04-26 DIAGNOSIS — M419 Scoliosis, unspecified: Secondary | ICD-10-CM | POA: Diagnosis not present

## 2023-04-26 DIAGNOSIS — S32039A Unspecified fracture of third lumbar vertebra, initial encounter for closed fracture: Secondary | ICD-10-CM | POA: Diagnosis not present

## 2023-04-26 DIAGNOSIS — Z9221 Personal history of antineoplastic chemotherapy: Secondary | ICD-10-CM | POA: Diagnosis not present

## 2023-04-26 DIAGNOSIS — M960 Pseudarthrosis after fusion or arthrodesis: Secondary | ICD-10-CM | POA: Diagnosis not present

## 2023-04-26 DIAGNOSIS — N400 Enlarged prostate without lower urinary tract symptoms: Secondary | ICD-10-CM | POA: Diagnosis not present

## 2023-04-26 DIAGNOSIS — T84216A Breakdown (mechanical) of internal fixation device of vertebrae, initial encounter: Secondary | ICD-10-CM | POA: Diagnosis not present

## 2023-04-26 DIAGNOSIS — Z981 Arthrodesis status: Secondary | ICD-10-CM | POA: Diagnosis not present

## 2023-04-26 DIAGNOSIS — Z87891 Personal history of nicotine dependence: Secondary | ICD-10-CM | POA: Diagnosis not present

## 2023-04-26 DIAGNOSIS — T84038A Mechanical loosening of other internal prosthetic joint, initial encounter: Secondary | ICD-10-CM | POA: Diagnosis not present

## 2023-04-26 DIAGNOSIS — Z8661 Personal history of infections of the central nervous system: Secondary | ICD-10-CM | POA: Diagnosis not present

## 2023-04-26 HISTORY — PX: OTHER SURGICAL HISTORY: SHX169

## 2023-05-15 DIAGNOSIS — M4316 Spondylolisthesis, lumbar region: Secondary | ICD-10-CM | POA: Diagnosis not present

## 2023-05-26 DIAGNOSIS — M4316 Spondylolisthesis, lumbar region: Secondary | ICD-10-CM | POA: Diagnosis not present

## 2023-05-26 DIAGNOSIS — Z981 Arthrodesis status: Secondary | ICD-10-CM | POA: Diagnosis not present

## 2023-06-01 DIAGNOSIS — C678 Malignant neoplasm of overlapping sites of bladder: Secondary | ICD-10-CM | POA: Diagnosis not present

## 2023-06-02 ENCOUNTER — Other Ambulatory Visit: Payer: Self-pay

## 2023-06-09 ENCOUNTER — Other Ambulatory Visit: Payer: Self-pay | Admitting: Urology

## 2023-06-10 ENCOUNTER — Other Ambulatory Visit: Payer: Self-pay

## 2023-06-22 ENCOUNTER — Other Ambulatory Visit: Payer: Self-pay

## 2023-06-22 ENCOUNTER — Encounter (HOSPITAL_BASED_OUTPATIENT_CLINIC_OR_DEPARTMENT_OTHER): Payer: Self-pay | Admitting: Urology

## 2023-06-22 NOTE — Progress Notes (Addendum)
Spoke w/ via phone for pre-op interview---pt Lab needs dos----I stat               Lab results------EKG 11-24-2022 epic, [facility]  cardiology ernest dick np 11-16-2022 epic, echo 11-04-2022 epic COVID test -----patient states asymptomatic no test needed Arrive at -------530 am 07-03-2023 NPO after MN NO Solid Food.  Clear liquids from MN until---430 am Med rec completed Medications to take morning of surgery -----systane eye drop prn Diabetic medication -----n/a Patient instructed no nail polish to be worn day of surgery Patient instructed to bring photo id and insurance card day of surgery Patient aware to have Driver (ride ) / caregiver   spouse Jeremy Day  for 24 hours after surgery  Patient Special Instructions -----none Pre-Op special Instructions -----none Patient verbalized understanding of instructions that were given at this phone interview. Patient denies shortness of breath, chest pain, fever, cough at this phone interview.

## 2023-07-03 ENCOUNTER — Ambulatory Visit (HOSPITAL_BASED_OUTPATIENT_CLINIC_OR_DEPARTMENT_OTHER)
Admission: RE | Admit: 2023-07-03 | Discharge: 2023-07-03 | Disposition: A | Discharge status: Home / Self Care | Payer: PPO | Source: Home / Self Care | Attending: Urology | Admitting: Urology

## 2023-07-03 ENCOUNTER — Encounter (HOSPITAL_BASED_OUTPATIENT_CLINIC_OR_DEPARTMENT_OTHER): Payer: Self-pay | Admitting: Urology

## 2023-07-03 ENCOUNTER — Other Ambulatory Visit: Payer: Self-pay

## 2023-07-03 ENCOUNTER — Ambulatory Visit (HOSPITAL_BASED_OUTPATIENT_CLINIC_OR_DEPARTMENT_OTHER): Payer: PPO | Admitting: Certified Registered Nurse Anesthetist

## 2023-07-03 ENCOUNTER — Encounter (HOSPITAL_BASED_OUTPATIENT_CLINIC_OR_DEPARTMENT_OTHER)
Admission: RE | Disposition: A | Discharge status: Home / Self Care | Payer: Self-pay | Source: Home / Self Care | Attending: Urology

## 2023-07-03 DIAGNOSIS — K219 Gastro-esophageal reflux disease without esophagitis: Secondary | ICD-10-CM | POA: Diagnosis not present

## 2023-07-03 DIAGNOSIS — K449 Diaphragmatic hernia without obstruction or gangrene: Secondary | ICD-10-CM | POA: Insufficient documentation

## 2023-07-03 DIAGNOSIS — Z01818 Encounter for other preprocedural examination: Secondary | ICD-10-CM

## 2023-07-03 DIAGNOSIS — D494 Neoplasm of unspecified behavior of bladder: Secondary | ICD-10-CM | POA: Diagnosis not present

## 2023-07-03 DIAGNOSIS — D090 Carcinoma in situ of bladder: Secondary | ICD-10-CM | POA: Diagnosis not present

## 2023-07-03 DIAGNOSIS — C679 Malignant neoplasm of bladder, unspecified: Secondary | ICD-10-CM | POA: Diagnosis not present

## 2023-07-03 DIAGNOSIS — F32A Depression, unspecified: Secondary | ICD-10-CM | POA: Insufficient documentation

## 2023-07-03 DIAGNOSIS — C678 Malignant neoplasm of overlapping sites of bladder: Secondary | ICD-10-CM | POA: Insufficient documentation

## 2023-07-03 HISTORY — PX: CYSTOSCOPY W/ RETROGRADES: SHX1426

## 2023-07-03 LAB — POCT I-STAT, CHEM 8
BUN: 10 mg/dL (ref 8–23)
Calcium, Ion: 1.25 mmol/L (ref 1.15–1.40)
Chloride: 101 mmol/L (ref 98–111)
Creatinine, Ser: 0.8 mg/dL (ref 0.61–1.24)
Glucose, Bld: 96 mg/dL (ref 70–99)
HCT: 41 % (ref 39.0–52.0)
Hemoglobin: 13.9 g/dL (ref 13.0–17.0)
Potassium: 3.8 mmol/L (ref 3.5–5.1)
Sodium: 140 mmol/L (ref 135–145)
TCO2: 23 mmol/L (ref 22–32)

## 2023-07-03 SURGERY — CYSTOSCOPY, WITH RETROGRADE PYELOGRAM
Anesthesia: General | Site: Ureter

## 2023-07-03 MED ORDER — OXYCODONE HCL 5 MG PO TABS: 5.0000 mg | ORAL_TABLET | Freq: Once | ORAL | Status: DC | PRN

## 2023-07-03 MED ORDER — CEFAZOLIN SODIUM-DEXTROSE 2-4 GM/100ML-% IV SOLN
2.0000 g | INTRAVENOUS | Status: AC
  Administered 2023-07-03: 2 g via INTRAVENOUS

## 2023-07-03 MED ORDER — PROPOFOL 500 MG/50ML IV EMUL
INTRAVENOUS | Status: AC
  Filled 2023-07-03: qty 50

## 2023-07-03 MED ORDER — FENTANYL CITRATE (PF) 100 MCG/2ML IJ SOLN: 25.0000 ug | INTRAMUSCULAR | Status: DC | PRN

## 2023-07-03 MED ORDER — LIDOCAINE 2% (20 MG/ML) 5 ML SYRINGE
INTRAMUSCULAR | Status: DC | PRN
  Administered 2023-07-03: 100 mg via INTRAVENOUS

## 2023-07-03 MED ORDER — PROPOFOL 10 MG/ML IV BOLUS
INTRAVENOUS | Status: DC | PRN
  Administered 2023-07-03: 140 mg via INTRAVENOUS

## 2023-07-03 MED ORDER — LACTATED RINGERS IV SOLN: INTRAVENOUS | Status: DC

## 2023-07-03 MED ORDER — ONDANSETRON HCL 4 MG/2ML IJ SOLN
INTRAMUSCULAR | Status: DC | PRN
  Administered 2023-07-03: 4 mg via INTRAVENOUS

## 2023-07-03 MED ORDER — MIDAZOLAM HCL 2 MG/2ML IJ SOLN
INTRAMUSCULAR | Status: DC | PRN
  Administered 2023-07-03: 1 mg via INTRAVENOUS

## 2023-07-03 MED ORDER — ONDANSETRON HCL 4 MG/2ML IJ SOLN: 4.0000 mg | Freq: Once | INTRAMUSCULAR | Status: DC | PRN

## 2023-07-03 MED ORDER — PROPOFOL 10 MG/ML IV BOLUS
INTRAVENOUS | Status: AC
  Filled 2023-07-03: qty 20

## 2023-07-03 MED ORDER — OXYCODONE HCL 5 MG/5ML PO SOLN: 5.0000 mg | Freq: Once | ORAL | Status: DC | PRN

## 2023-07-03 MED ORDER — KETOROLAC TROMETHAMINE 30 MG/ML IJ SOLN
INTRAMUSCULAR | Status: DC | PRN
  Administered 2023-07-03: 30 mg via INTRAVENOUS

## 2023-07-03 MED ORDER — IOHEXOL 300 MG/ML  SOLN
INTRAMUSCULAR | Status: DC | PRN
  Administered 2023-07-03: 20 mL via URETHRAL

## 2023-07-03 MED ORDER — FENTANYL CITRATE (PF) 100 MCG/2ML IJ SOLN
INTRAMUSCULAR | Status: AC
  Filled 2023-07-03: qty 2

## 2023-07-03 MED ORDER — SODIUM CHLORIDE 0.9 % IR SOLN
Status: DC | PRN
  Administered 2023-07-03: 3000 mL

## 2023-07-03 MED ORDER — GEMCITABINE CHEMO FOR BLADDER INSTILLATION 2000 MG
2000.0000 mg | Freq: Once | INTRAVENOUS | Status: AC
  Administered 2023-07-03: 2000 mg via INTRAVESICAL
  Filled 2023-07-03: qty 2000

## 2023-07-03 MED ORDER — CEFAZOLIN SODIUM-DEXTROSE 2-4 GM/100ML-% IV SOLN
INTRAVENOUS | Status: AC
  Filled 2023-07-03: qty 100

## 2023-07-03 MED ORDER — ACETAMINOPHEN 10 MG/ML IV SOLN: 1000.0000 mg | Freq: Once | INTRAVENOUS | Status: DC | PRN

## 2023-07-03 MED ORDER — MIDAZOLAM HCL 2 MG/2ML IJ SOLN
INTRAMUSCULAR | Status: AC
  Filled 2023-07-03: qty 2

## 2023-07-03 MED ORDER — FENTANYL CITRATE (PF) 250 MCG/5ML IJ SOLN
INTRAMUSCULAR | Status: DC | PRN
  Administered 2023-07-03 (×2): 25 ug via INTRAVENOUS
  Administered 2023-07-03: 50 ug via INTRAVENOUS

## 2023-07-03 MED ORDER — DEXAMETHASONE SODIUM PHOSPHATE 10 MG/ML IJ SOLN
INTRAMUSCULAR | Status: DC | PRN
  Administered 2023-07-03: 5 mg via INTRAVENOUS

## 2023-07-03 MED ORDER — WHITE PETROLATUM EX OINT
TOPICAL_OINTMENT | CUTANEOUS | Status: AC
  Filled 2023-07-03: qty 5

## 2023-07-03 SURGICAL SUPPLY — 32 items
BAG DRAIN URO-CYSTO SKYTR STRL (DRAIN) ×3 IMPLANT
BAG DRN RND TRDRP ANRFLXCHMBR (UROLOGICAL SUPPLIES) ×2
BAG DRN UROCATH (DRAIN) ×2
BAG URINE DRAIN 2000ML AR STRL (UROLOGICAL SUPPLIES) IMPLANT
BAG URINE LEG 500ML (DRAIN) IMPLANT
CATH FOLEY 2WAY SLVR 30CC 20FR (CATHETERS) IMPLANT
CATH FOLEY 2WAY SLVR 5CC 18FR (CATHETERS) IMPLANT
CATH FOLEY 2WAY SLVR 5CC 22FR (CATHETERS) IMPLANT
CATH URETL OPEN END 6FR 70 (CATHETERS) IMPLANT
CLOTH BEACON ORANGE TIMEOUT ST (SAFETY) ×3 IMPLANT
ELECT REM PT RETURN 9FT ADLT (ELECTROSURGICAL) ×2
ELECTRODE REM PT RTRN 9FT ADLT (ELECTROSURGICAL) IMPLANT
EVACUATOR MICROVAS BLADDER (UROLOGICAL SUPPLIES) IMPLANT
GLOVE BIO SURGEON STRL SZ7.5 (GLOVE) ×3 IMPLANT
GOWN STRL REUS W/TWL LRG LVL3 (GOWN DISPOSABLE) ×3 IMPLANT
GOWN STRL REUS W/TWL XL LVL3 (GOWN DISPOSABLE) IMPLANT
GUIDEWIRE STR DUAL SENSOR (WIRE) ×3 IMPLANT
HOLDER FOLEY CATH W/STRAP (MISCELLANEOUS) IMPLANT
IV NS IRRIG 3000ML ARTHROMATIC (IV SOLUTION) ×3 IMPLANT
KIT TURNOVER CYSTO (KITS) ×3 IMPLANT
LASER FIB FLEXIVA PULSE ID 365 (Laser) IMPLANT
LOOP CUT BIPOLAR 24F LRG (ELECTROSURGICAL) IMPLANT
MANIFOLD NEPTUNE II (INSTRUMENTS) ×3 IMPLANT
NS IRRIG 500ML POUR BTL (IV SOLUTION) ×3 IMPLANT
PACK CYSTO (CUSTOM PROCEDURE TRAY) ×3 IMPLANT
SLEEVE SCD COMPRESS KNEE MED (STOCKING) ×3 IMPLANT
SYR TOOMEY IRRIG 70ML (MISCELLANEOUS) ×2
SYRINGE TOOMEY IRRIG 70ML (MISCELLANEOUS) IMPLANT
TRACTIP FLEXIVA PULS ID 200XHI (Laser) IMPLANT
TRACTIP FLEXIVA PULSE ID 200 (Laser)
TUBE CONNECTING 12X1/4 (SUCTIONS) IMPLANT
TUBING UROLOGY SET (TUBING) ×3 IMPLANT

## 2023-07-03 NOTE — H&P (Addendum)
CC/HPI: CC: Bladder cancer  HPI:  01/24/2019  72 year old male presents with a primary complaint of urgency to urinate with low volume output. He also complains about mildly weak stream, nocturia 3 in difficulty postponing urination. AUA symptom score is 15. He denies a history of urinary tract infections except for once in the 1970s. He denies current hematuria or dysuria. Incidentally, he notes a 2 year history of intermittent gross hematuria. Most recently, the gross hematuria symptoms have increased and he had 3 episodes of gross hematuria last week. He has never had this worked up. He is asymptomatic in this regard. He started Flomax about 2 weeks ago and has not noticed much of a difference.   02/01/2019  Patient underwent a CT IVP. This revealed multiple filling defects in his bladder consistent with likely tumors. Kidney and ureter were normal. PSA at the last visit was normal.   02/11/19: s/p 1st TURBT on 02/06/19. 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 He tolerated well and returns today for pathology review and TOV. Urine has been clear. He has no complaints today. In the operating room, he was found have a large amount of tumor. It was not completely resectable with one surgery.   02/26/2019  Patient is status post his second TURBT on 02/20/2019. 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 He has been doing well with the catheter. Urine is cleared up. He greatly desires to avoid cystectomy. He does understand that he has a large amount of tumor involvement and after 2 resections, I was not able to remove all the tumor. However, given no muscle involvement. He desires for me to try full resection endoscopically. 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   03/19/2019  Patient status post his third TURBT. He tolerated this well. Hematuria is resolving. 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 Muscle  was present in the specimen. All tumor was resected. After this third TURBT.   08/23/2019  Patient completed BCG. He presents for cystoscopy. He denies interval gross hematuria.   10/02/2019  Patient is status post TURBT. This revealed superficial low-grade urothelial cell carcinoma. No evidence of high-grade cancer. He has had no further gross hematuria. No pain.   02/27/2020  Patient completed repeat induction BCG. He presents for cystoscopy. He does also complain about significant urinary frequency and urgency as well as nocturia 2-4 times a night. He is on tamsulosin.   05/27/2020  Patient presents for surveillance cystoscopy. He completed his maintenance BCG. His urinary urgency and frequency have improved. Nocturia is intermittent. It depends on the amount that he drinks. He is not taking Myrbetriq. It did not really help. He underwent a CT of the abdomen and pelvis, CT IVP. This revealed no evidence of upper tract malignancy.   07/02/2020  Patient underwent TURBT --small with instillation of gemcitabine. Pathology revealed early noninvasive low-grade urothelial cell carcinoma. He has no complaints today.   11/27/2020:  S/p TURBT 11/20/2020 with benign urothelium and submucosa with dystrophic calcification and reactive giant cells. NED.   06/25/2021  Patient had repeat upper tract imaging that revealed no evidence of genitourinary malignancy. He presents for surveillance cystoscopy.   10/14/2021  Patient presents for surveillance cystoscopy after undergoing a cystoscopy with office fulguration a couple months ago.   12/31/2021  Patient presents today for destruction of bladder tumor 0.5 to 2 cm.   04/01/2022  Patient status post destruction of bladder tumors in the office. Presents for surveillance cystoscopy. Completed  maintenance BCG x3.   08/04/2022  Patient completed repeat maintenance BCG x3. Presents for surveillance cystoscopy. Does complain about some urinary complaints  including frequency, urgency, weak stream. Has taken tamsulosin in the past but did not think that helped very much.   11/30/2022  Patient status post TURBT with instillation of gemcitabine. 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 Patient doing well.   06/01/2023  Patient completed intravesical gemcitabine. He presents for surveillance cystoscopy.   07/03/2023 Patient presents today for TURBT with bilateral retrograde pyelogram and instillation of gemcitabine.    ALLERGIES: No Allergies    MEDICATIONS: Tamsulosin Hcl 0.4 mg capsule 1 capsule PO Daily  Anti-Depresssant  Calcium Carbonate  Clobetasol Propionate PRN  Diphenhydramine Hcl 25 mg capsule capsule PRN  Famotidine 20 mg tablet  Mometasone Furoate PRN  Oxycodone-Acetaminophen PRN  Pimecrolimus 1 % cream PRN     GU PSH: Bladder Instill AntiCA Agent - 11/21/2022, 05/11/2022, 05/03/2022, 04/26/2022, 01/25/2022, 01/18/2022, 01/11/2022, 2022, 2021, 2021, 2021, 2021, 2021, 2021, 2021, 2020, 2020, 2020, 2020, 2020, 2020, 2020, 2020, 2020, 2020 Cystoscopy - 03/02/2023, 11/03/2022, 08/04/2022, 10/14/2021, 06/25/2021, 2022, 2021, 2021, 2021, 2020, 2020 Cystoscopy TURBT <2 cm - 12/31/2021, 06/28/2021, 2022, 2021 Cystoscopy TURBT >5 cm - 2020, 2020, 2020 Cystoscopy TURBT 2-5 cm - 11/21/2022, 2020 Locm 300-399Mg /Ml Iodine,1Ml - 06/22/2021, 2021, 2020       PSH Notes: spinal fusion 09-05-2021   NON-GU PSH: No Non-GU PSH    GU PMH: Bladder Cancer overlapping sites - 03/02/2023, - 11/30/2022, - 11/03/2022, - 08/04/2022, - 05/11/2022, - 05/03/2022, - 04/26/2022, - 04/01/2022, - 01/25/2022, - 01/18/2022, - 01/11/2022, - 12/31/2021, - 10/14/2021, - 06/28/2021, - 06/25/2021, - 06/22/2021, - 2022, - 2022, - 2021, HGT1 bladder cancer Catheter removed today We discussed proceeding with repeat TURBT for confirmatory staging and residual tumor resection. , - 2020 BPH w/LUTS - 03/02/2023, - 08/04/2022, - 06/25/2021, - 2021, - 2020 Nocturia -  03/02/2023, - 08/04/2022, - 06/25/2021 (Stable), - 2021, - 2020 Urinary Frequency - 08/04/2022, - 2021 Urinary Urgency - 08/04/2022, - 06/25/2021, - 2020 Weak Urinary Stream - 08/04/2022, - 2020 Bladder tumor/neoplasm - 2020 Encounter for Prostate Cancer screening - 2020 Gross hematuria - 2020    NON-GU PMH: No Non-GU PMH    FAMILY HISTORY: No Family History    SOCIAL HISTORY: Marital Status: Married    REVIEW OF SYSTEMS:    GU Review Male:   Patient denies frequent urination, hard to postpone urination, burning/ pain with urination, get up at night to urinate, leakage of urine, stream starts and stops, trouble starting your stream, have to strain to urinate , erection problems, and penile pain.  Gastrointestinal (Upper):   Patient denies nausea, indigestion/ heartburn, and vomiting.  Gastrointestinal (Lower):   Patient denies diarrhea and constipation.  Constitutional:   Patient denies fever, night sweats, weight loss, and fatigue.  Skin:   Patient denies skin rash/ lesion and itching.  Eyes:   Patient denies blurred vision and double vision.  Ears/ Nose/ Throat:   Patient denies sore throat and sinus problems.  Hematologic/Lymphatic:   Patient denies swollen glands and easy bruising.  Cardiovascular:   Patient denies leg swelling and chest pains.  Respiratory:   Patient denies cough and shortness of breath.  Endocrine:   Patient denies excessive thirst.  Musculoskeletal:   Patient denies back pain and joint pain.  Neurological:   Patient denies headaches and dizziness.  Psychologic:   Patient denies depression and anxiety.   BP 121/85  Pulse (!) 54   Temp (!) 97.5 F (36.4 C) (Oral)   Resp 16   Ht 6' (1.829 m)   Wt 90 kg   SpO2 98%   BMI 26.92 kg/m    MULTI-SYSTEM PHYSICAL EXAMINATION:    Constitutional: Well-nourished. No physical deformities. Normally developed. Good grooming.  Gastrointestinal: No mass, no tenderness, no rigidity, non obese abdomen.  Eyes: Normal  conjunctivae. Normal eyelids.  Musculoskeletal: Normal gait and station of head and neck.      ASSESSMENT:      ICD-10 Details  1 GU:   Bladder Cancer overlapping sites - C67.8 Chronic, Worsening   PLAN:           Notes:   Plan for transurethral resection of bladder tumor with instillation of gemcitabine. Also bilateral retrograde pyelogram.

## 2023-07-03 NOTE — Progress Notes (Signed)
Ambulated patient to the bathroom

## 2023-07-03 NOTE — Discharge Instructions (Addendum)
Transurethral Resection of Bladder Tumor (TURBT) or Bladder Biopsy   Definition:  Transurethral Resection of the Bladder Tumor is a surgical procedure used to diagnose and remove tumors within the bladder. TURBT is the most common treatment for early stage bladder cancer.  General instructions:     Your recent bladder surgery requires very little post hospital care but some definite precautions.  Despite the fact that no skin incisions were used, the area around the bladder incisions are raw and covered with scabs to promote healing and prevent bleeding. Certain precautions are needed to insure that the scabs are not disturbed over the next 2-4 weeks while the healing proceeds.  Because the raw surface inside your bladder and the irritating effects of urine you may expect frequency of urination and/or urgency (a stronger desire to urinate) and perhaps even getting up at night more often. This will usually resolve or improve slowly over the healing period. You may see some blood in your urine over the first 6 weeks. Do not be alarmed, even if the urine was clear for a while. Get off your feet and drink lots of fluids until clearing occurs. If you start to pass clots or don't improve call us.  Diet:  You may return to your normal diet immediately. Because of the raw surface of your bladder, alcohol, spicy foods, foods high in acid and drinks with caffeine may cause irritation or frequency and should be used in moderation. To keep your urine flowing freely and avoid constipation, drink plenty of fluids during the day (8-10 glasses). Tip: Avoid cranberry juice because it is very acidic.  Activity:  Your physical activity doesn't need to be restricted. However, if you are very active, you may see some blood in the urine. We suggest that you reduce your activity under the circumstances until the bleeding has stopped.  Bowels:  It is important to keep your bowels regular during the postoperative  period. Straining with bowel movements can cause bleeding. A bowel movement every other day is reasonable. Use a mild laxative if needed, such as milk of magnesia 2-3 tablespoons, or 2 Dulcolax tablets. Call if you continue to have problems. If you had been taking narcotics for pain, before, during or after your surgery, you may be constipated. Take a laxative if necessary.    Medication:  You should resume your pre-surgery medications unless told not to. In addition you may be given an antibiotic to prevent or treat infection. Antibiotics are not always necessary. All medication should be taken as prescribed until the bottles are finished unless you are having an unusual reaction to one of the drugs.    No ibuprofen, Advil, Aleve, Motrin, ketorolac, meloxicam, naproxen, or other NSAIDS until after 2:00pm today if needed for pain.     Post Anesthesia Home Care Instructions  Activity: Get plenty of rest for the remainder of the day. A responsible individual must stay with you for 24 hours following the procedure.  For the next 24 hours, DO NOT: -Drive a car -Advertising copywriter -Drink alcoholic beverages -Take any medication unless instructed by your physician -Make any legal decisions or sign important papers.  Meals: Start with liquid foods such as gelatin or soup. Progress to regular foods as tolerated. Avoid greasy, spicy, heavy foods. If nausea and/or vomiting occur, drink only clear liquids until the nausea and/or vomiting subsides. Call your physician if vomiting continues.  Special Instructions/Symptoms: Your throat may feel dry or sore from the anesthesia or the breathing tube  placed in your throat during surgery. If this causes discomfort, gargle with warm salt water. The discomfort should disappear within 24 hours.

## 2023-07-03 NOTE — Anesthesia Postprocedure Evaluation (Signed)
Anesthesia Post Note  Patient: Jeremy Day  Procedure(s) Performed: CYSTOSCOPY WITH BILATERAL RETROGRADE PYELOGRAM (Bilateral: Ureter) TRANSURETHRAL RESECTION OF BLADDER TUMOR (TURBT) with GEMCITABINE (Bladder)     Patient location during evaluation: PACU Anesthesia Type: General Level of consciousness: awake and alert Pain management: pain level controlled Vital Signs Assessment: post-procedure vital signs reviewed and stable Respiratory status: spontaneous breathing, nonlabored ventilation, respiratory function stable and patient connected to nasal cannula oxygen Cardiovascular status: blood pressure returned to baseline and stable Postop Assessment: no apparent nausea or vomiting Anesthetic complications: no   No notable events documented.  Last Vitals:  Vitals:   07/03/23 0830 07/03/23 0845  BP: 110/74   Pulse: (!) 51   Resp: 10   Temp:  (!) 36.2 C  SpO2: 99%     Last Pain:  Vitals:   07/03/23 0845  TempSrc:   PainSc: 0-No pain                 Mariann Barter

## 2023-07-03 NOTE — Anesthesia Preprocedure Evaluation (Addendum)
Anesthesia Evaluation  Patient identified by MRN, date of birth, ID band Patient awake    Reviewed: Allergy & Precautions, NPO status , Patient's Chart, lab work & pertinent test results, reviewed documented beta blocker date and time   Airway Mallampati: III  TM Distance: >3 FB     Dental  (+) Edentulous Lower, Edentulous Upper Posts upper and lower:   Pulmonary former smoker   breath sounds clear to auscultation       Cardiovascular METS: 5 - 7 Mets + dysrhythmias (bigeminy with near syncope)  Rhythm:Regular Rate:Normal  Recent tte with no acutely concerning findings   Neuro/Psych  PSYCHIATRIC DISORDERS  Depression       GI/Hepatic hiatal hernia,GERD  ,,  Endo/Other    Renal/GU Renal disease     Musculoskeletal   Abdominal   Peds  Hematology  (+) Blood dyscrasia, anemia   Anesthesia Other Findings   Reproductive/Obstetrics                             Anesthesia Physical Anesthesia Plan  ASA: 2  Anesthesia Plan: General   Post-op Pain Management:    Induction: Intravenous  PONV Risk Score and Plan: 1 and Ondansetron  Airway Management Planned: LMA  Additional Equipment:   Intra-op Plan:   Post-operative Plan: Extubation in OR  Informed Consent: I have reviewed the patients History and Physical, chart, labs and discussed the procedure including the risks, benefits and alternatives for the proposed anesthesia with the patient or authorized representative who has indicated his/her understanding and acceptance.     Dental advisory given  Plan Discussed with: CRNA  Anesthesia Plan Comments:         Anesthesia Quick Evaluation

## 2023-07-03 NOTE — Op Note (Signed)
Operative Note  Preoperative diagnosis:  1.  Bladder tumor  Postoperative diagnosis: 1.  Bladder tumor-size medium  Procedure(s): 1.  Transurethral resection of bladder tumor--medium 2.  Bilateral retrograde pyelogram 3.  Intravesical instillation of gemcitabine  Surgeon: Modena Slater, MD  Assistants: None  Anesthesia: General  Complications: None immediate  EBL: Minimal  Specimens: 1.  Bladder tumor  Drains/Catheters: 1.  18 French Foley catheter  Intraoperative findings: 1.  Normal anterior urethra 2.  Short nonobstructing prostate 3.  Approximately 2 cm bladder tumor at the right bladder neck completely resected.  He also had some very superficial bladder tumor on the anterior bladder wall a little bit towards the right of midline.  Remainder of bladder mucosa without tumor.  Bilateral retrograde pyelogram without any filling defect or hydronephrosis.  Indication: 72 year old male with recurrent bladder tumor presents for the previously mentioned operation.  Description of procedure:  The patient was identified and consent was obtained.  The patient was taken to the operating room and placed in the supine position.  The patient was placed under general anesthesia.  Perioperative antibiotics were administered.  The patient was placed in dorsal lithotomy.  Patient was prepped and draped in a standard sterile fashion and a timeout was performed.  A 21 French rigid cystoscope was advanced into the urethra and into the bladder.  Complete cystoscopy was performed with the findings noted above.  The left ureter was intubated with an open-ended ureteral catheter and a retrograde pyelogram was performed with no abnormal findings.  Same was performed on the right again with no abnormal findings.  I then exchanged for the 26 French resectoscope with a visual obturator and placed advanced that into the urethra and into the bladder.  I exchanged for the bipolar working element and resected  the tumors of interest followed by fulguration of the resection beds in the surrounding area.  All tumor was treated.  I collected the specimen.  I withdrew the scope and placed an 49 French Foley catheter.  Patient tolerated the procedure well and was stable postoperatively.  In the PACU, the patient underwent instillation of gemcitabine which remained for proxy 1 hour prior to proper disposal.  Plan: Follow-up in 1 week for pathology review

## 2023-07-03 NOTE — Anesthesia Procedure Notes (Signed)
Procedure Name: LMA Insertion Date/Time: 07/03/2023 7:37 AM  Performed by: Dairl Ponder, CRNAPre-anesthesia Checklist: Patient identified, Emergency Drugs available, Suction available and Patient being monitored Patient Re-evaluated:Patient Re-evaluated prior to induction Oxygen Delivery Method: Circle System Utilized Preoxygenation: Pre-oxygenation with 100% oxygen Induction Type: IV induction Ventilation: Mask ventilation without difficulty LMA: LMA inserted LMA Size: 4.0 Number of attempts: 1 Airway Equipment and Method: Bite block Placement Confirmation: positive ETCO2 Tube secured with: Tape Dental Injury: Teeth and Oropharynx as per pre-operative assessment

## 2023-07-03 NOTE — Transfer of Care (Signed)
Immediate Anesthesia Transfer of Care Note  Patient: KHAMIR APPLIN  Procedure(s) Performed: CYSTOSCOPY WITH BILATERAL RETROGRADE PYELOGRAM (Bilateral: Ureter) TRANSURETHRAL RESECTION OF BLADDER TUMOR (TURBT) with GEMCITABINE (Bladder)  Patient Location: PACU  Anesthesia Type:General  Level of Consciousness: sedated  Airway & Oxygen Therapy: Patient Spontanous Breathing and Patient connected to face mask oxygen  Post-op Assessment: Report given to RN and Post -op Vital signs reviewed and stable  Post vital signs: Reviewed and stable  Last Vitals:  Vitals Value Taken Time  BP 120/83 07/03/23 0813  Temp    Pulse 51 07/03/23 0814  Resp 10 07/03/23 0814  SpO2 100 % 07/03/23 0814  Vitals shown include unfiled device data.  Last Pain:  Vitals:   07/03/23 0612  TempSrc: Oral  PainSc: 0-No pain      Patients Stated Pain Goal: 5 (07/03/23 0612)  Complications: No notable events documented.

## 2023-07-03 NOTE — Progress Notes (Addendum)
Client meets Phase II DC criteria, VSS, alert and oriented, MAE x 4, gait steady, able to dress self and ambulate. Client stated he was ready to leave, asked if his wife or family/friend here to drive him home? Responded with "my wife does not drive and we do not have any friends". Informed client that he needed to have a responsible adult with him to drive him home, he stated he drove himself. Explained to client effects of narcotics, anesthesia agents and effects of benzos. Spoke with attending MDA, agreed that client his stable to leave by cab, but not authorized to operate a vehicle. Arranged for cab ride home.

## 2023-07-04 ENCOUNTER — Encounter (HOSPITAL_BASED_OUTPATIENT_CLINIC_OR_DEPARTMENT_OTHER): Payer: Self-pay | Admitting: Urology

## 2023-07-04 ENCOUNTER — Other Ambulatory Visit: Payer: Self-pay

## 2023-07-05 LAB — SURGICAL PATHOLOGY

## 2023-07-10 ENCOUNTER — Other Ambulatory Visit: Payer: Self-pay

## 2023-07-12 DIAGNOSIS — C678 Malignant neoplasm of overlapping sites of bladder: Secondary | ICD-10-CM | POA: Diagnosis not present

## 2023-07-15 ENCOUNTER — Other Ambulatory Visit: Payer: Self-pay

## 2023-07-17 ENCOUNTER — Other Ambulatory Visit: Payer: Self-pay

## 2023-07-20 DIAGNOSIS — M4316 Spondylolisthesis, lumbar region: Secondary | ICD-10-CM | POA: Diagnosis not present

## 2023-08-08 ENCOUNTER — Encounter: Payer: Self-pay | Admitting: Hematology

## 2023-08-08 ENCOUNTER — Inpatient Hospital Stay: Payer: PPO | Admitting: Hematology

## 2023-08-08 ENCOUNTER — Inpatient Hospital Stay: Payer: PPO | Attending: Hematology

## 2023-08-08 VITALS — BP 113/83 | HR 51 | Temp 97.6°F | Resp 17 | Ht 72.0 in | Wt 200.2 lb

## 2023-08-08 DIAGNOSIS — C678 Malignant neoplasm of overlapping sites of bladder: Secondary | ICD-10-CM

## 2023-08-08 DIAGNOSIS — Z79899 Other long term (current) drug therapy: Secondary | ICD-10-CM | POA: Insufficient documentation

## 2023-08-08 LAB — CBC WITH DIFFERENTIAL/PLATELET
Abs Immature Granulocytes: 0.03 10*3/uL (ref 0.00–0.07)
Basophils Absolute: 0.1 10*3/uL (ref 0.0–0.1)
Basophils Relative: 1 %
Eosinophils Absolute: 0.2 10*3/uL (ref 0.0–0.5)
Eosinophils Relative: 3 %
HCT: 41.2 % (ref 39.0–52.0)
Hemoglobin: 12.8 g/dL — ABNORMAL LOW (ref 13.0–17.0)
Immature Granulocytes: 0 %
Lymphocytes Relative: 22 %
Lymphs Abs: 1.7 10*3/uL (ref 0.7–4.0)
MCH: 27.5 pg (ref 26.0–34.0)
MCHC: 31.1 g/dL (ref 30.0–36.0)
MCV: 88.4 fL (ref 80.0–100.0)
Monocytes Absolute: 1.1 10*3/uL — ABNORMAL HIGH (ref 0.1–1.0)
Monocytes Relative: 14 %
Neutro Abs: 4.7 10*3/uL (ref 1.7–7.7)
Neutrophils Relative %: 60 %
Platelets: 209 10*3/uL (ref 150–400)
RBC: 4.66 MIL/uL (ref 4.22–5.81)
RDW: 15.8 % — ABNORMAL HIGH (ref 11.5–15.5)
WBC: 7.7 10*3/uL (ref 4.0–10.5)
nRBC: 0 % (ref 0.0–0.2)

## 2023-08-08 LAB — COMPREHENSIVE METABOLIC PANEL
ALT: 10 U/L (ref 0–44)
AST: 18 U/L (ref 15–41)
Albumin: 3.9 g/dL (ref 3.5–5.0)
Alkaline Phosphatase: 71 U/L (ref 38–126)
Anion gap: 3 — ABNORMAL LOW (ref 5–15)
BUN: 15 mg/dL (ref 8–23)
CO2: 30 mmol/L (ref 22–32)
Calcium: 9.3 mg/dL (ref 8.9–10.3)
Chloride: 105 mmol/L (ref 98–111)
Creatinine, Ser: 0.93 mg/dL (ref 0.61–1.24)
GFR, Estimated: >60 mL/min (ref >60)
Glucose, Bld: 81 mg/dL (ref 70–99)
Potassium: 4.5 mmol/L (ref 3.5–5.1)
Sodium: 138 mmol/L (ref 135–145)
Total Bilirubin: 0.5 mg/dL (ref 0.3–1.2)
Total Protein: 6.7 g/dL (ref 6.5–8.1)

## 2023-08-08 IMAGING — DX DG TIBIA/FIBULA 2V*L*
4 series · 4 of 4 positions shown · non-contrast
Comparison: None Available.

CLINICAL DATA: Dog bite on left leg

EXAM:
LEFT TIBIA AND FIBULA - 2 VIEW

[tibia ap (1 of 2)]
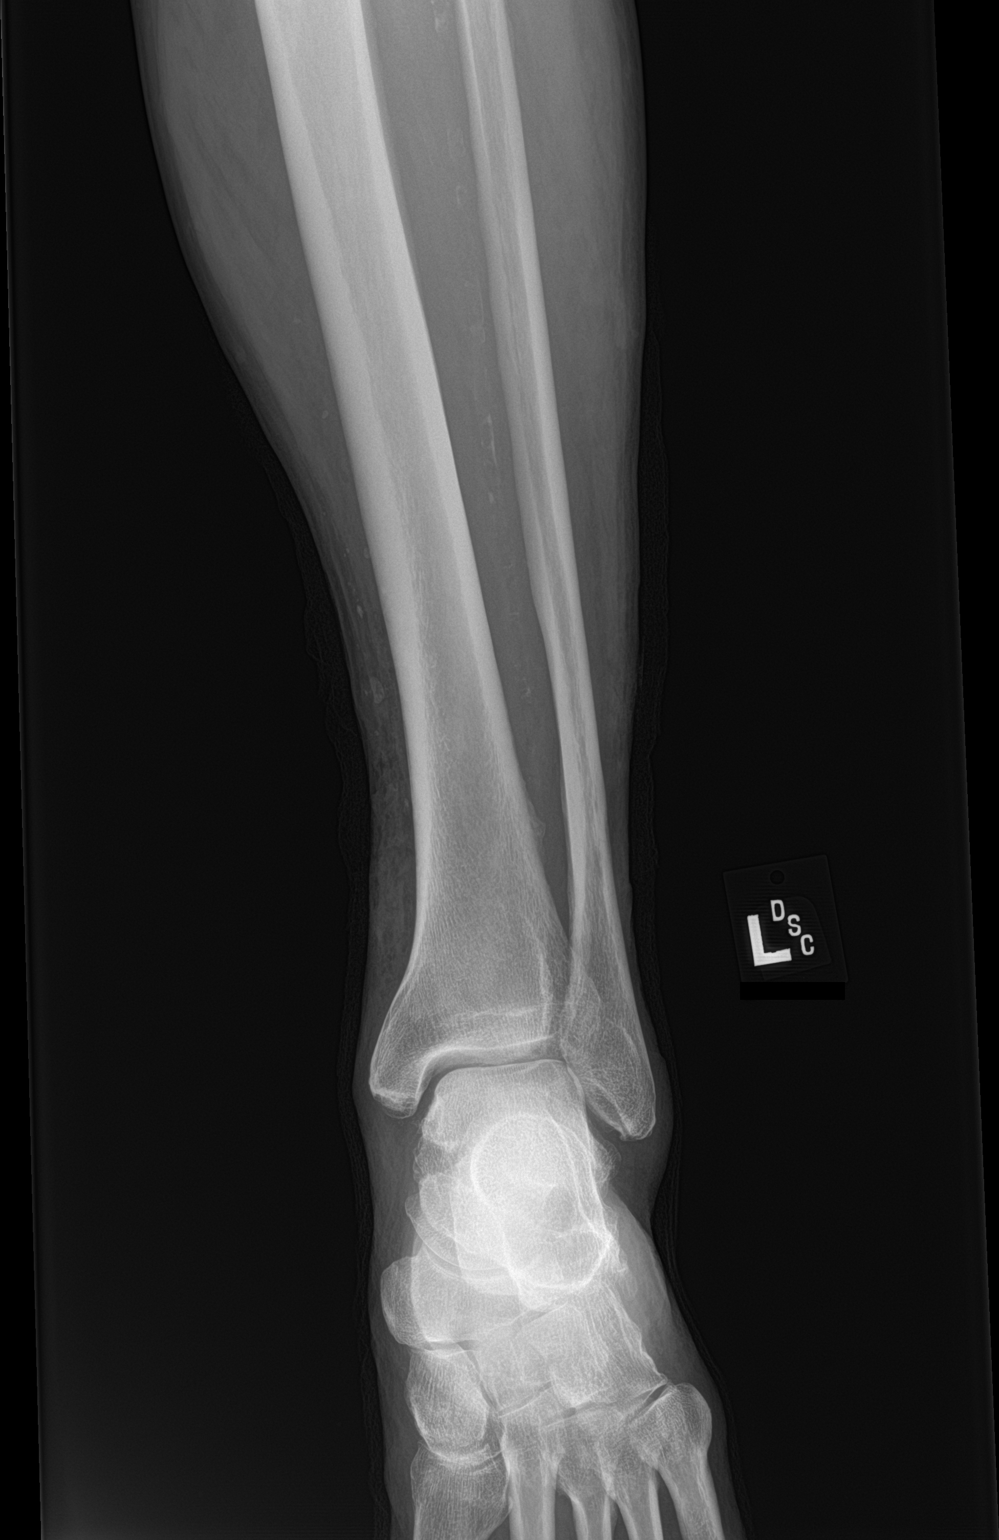

[tibia ap (2 of 2)]
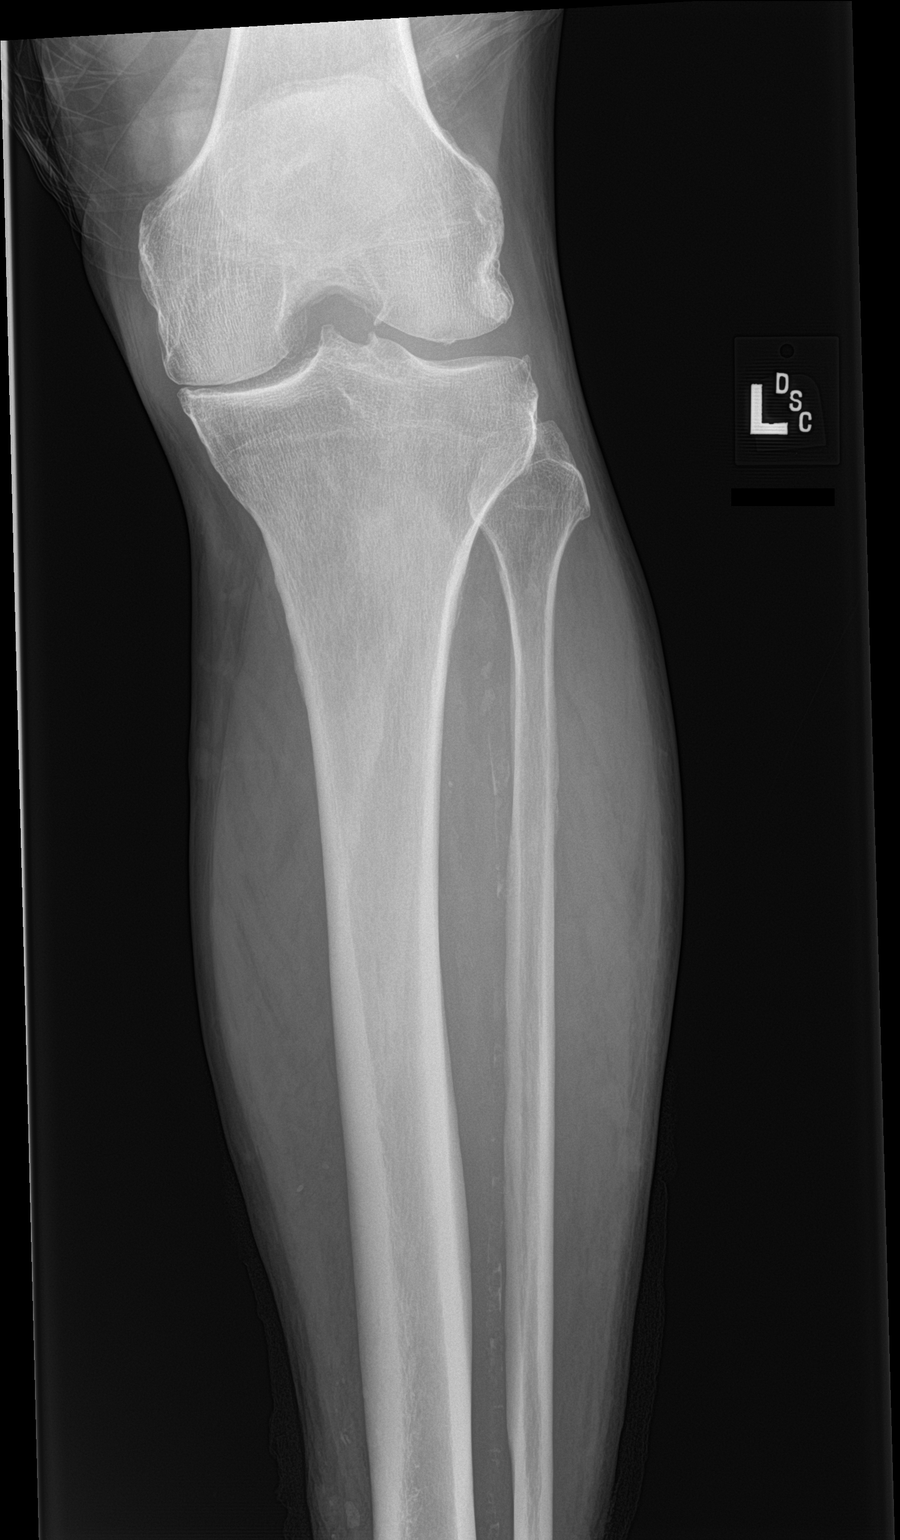

[tibia lat (1 of 2)]
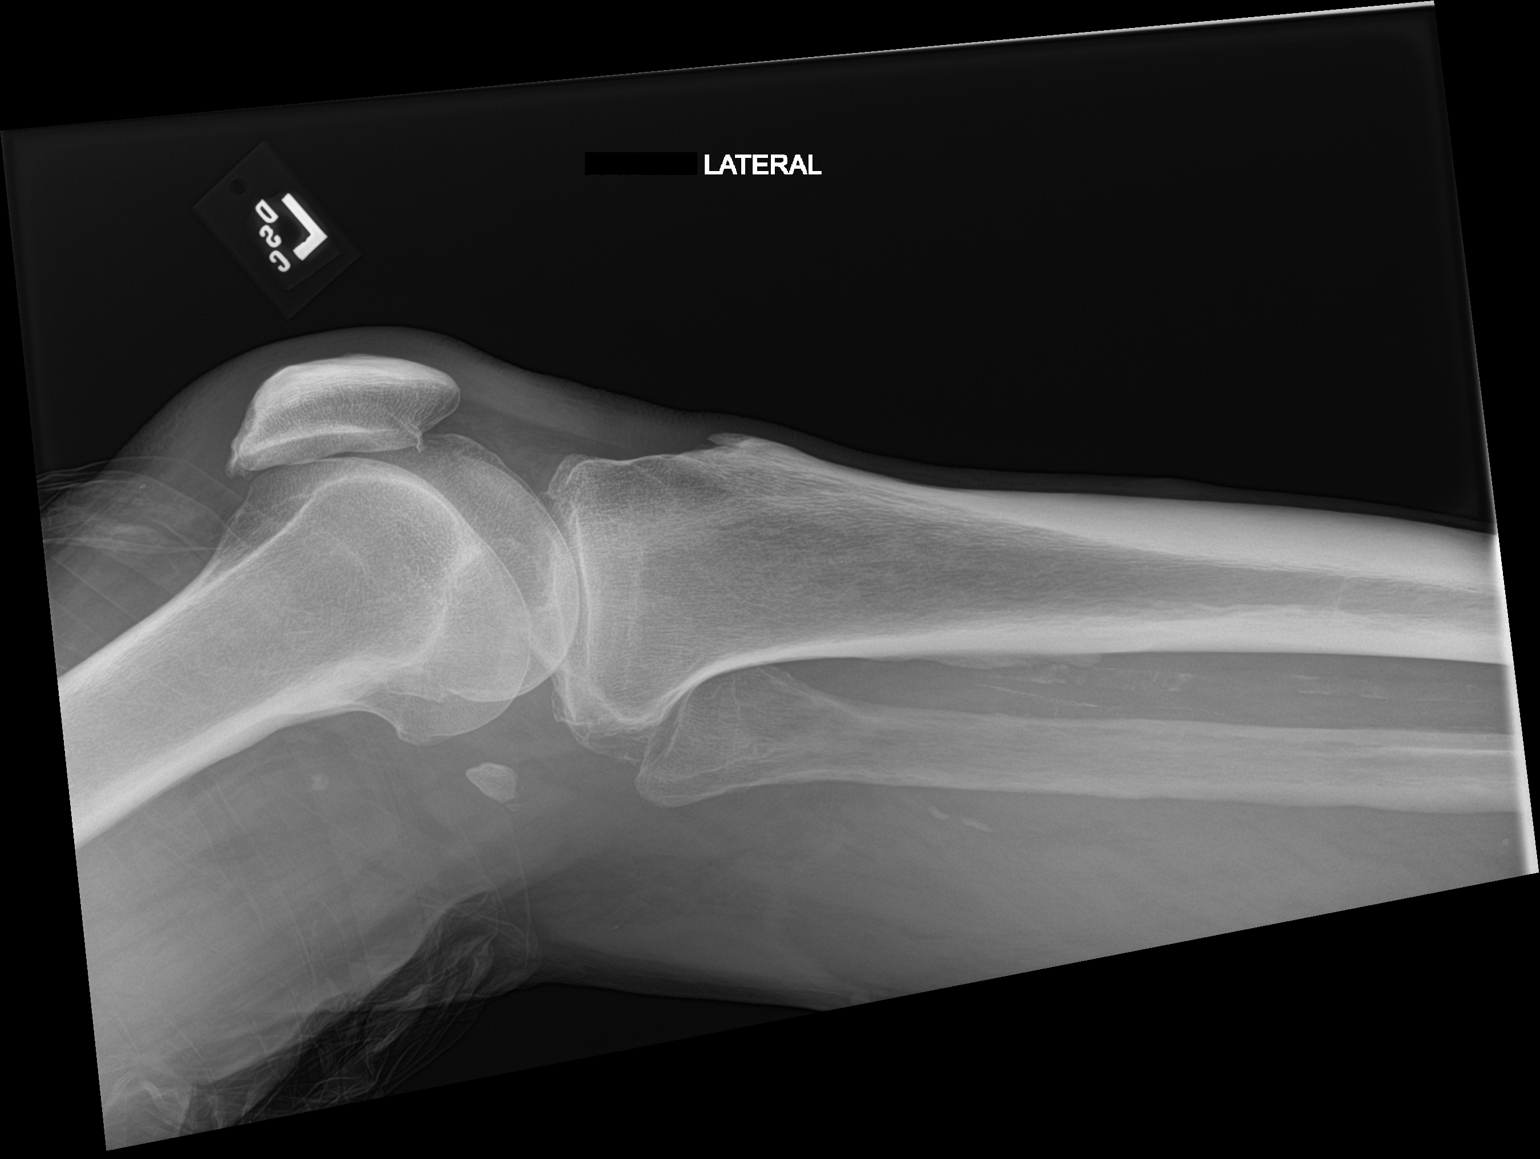

[tibia lat (2 of 2)]
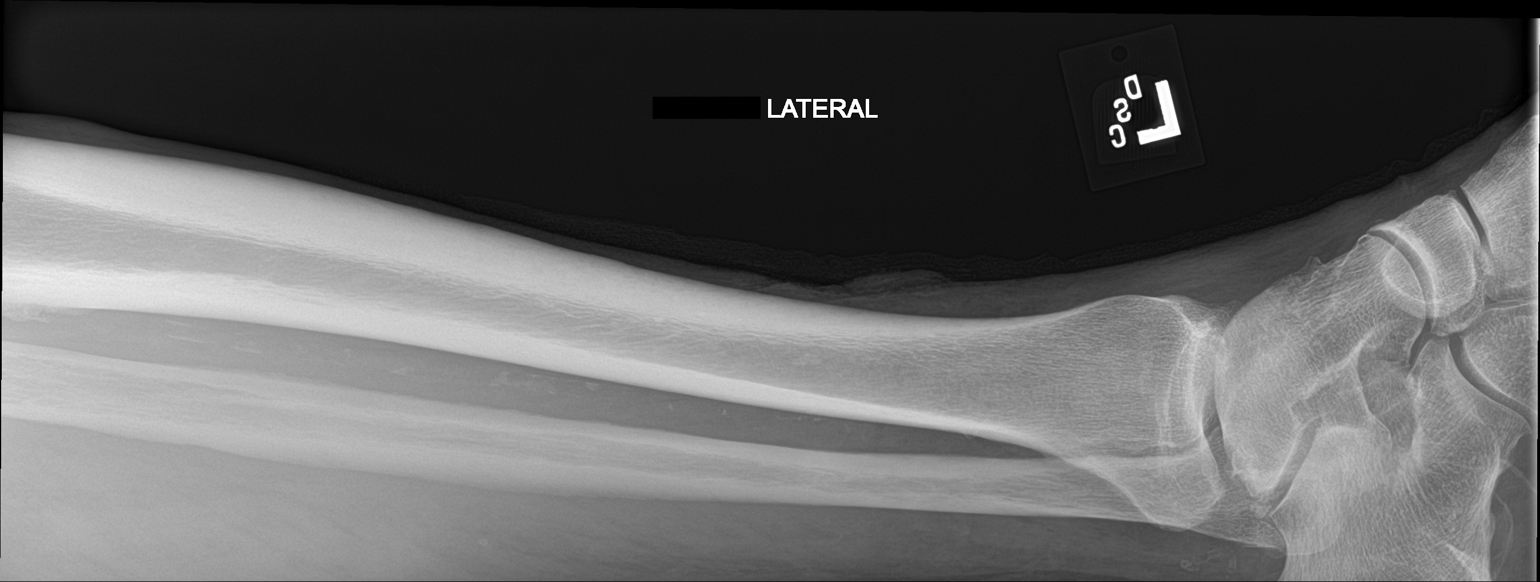

[4 of 4 positions shown; findings below may reference images not displayed]

FINDINGS: There is no acute fracture or dislocation. Bony alignment is normal.
There is soft tissue irregularity along the anterior shin on the
lateral projection likely reflecting laceration. There is no
retained radiopaque foreign body. There is degenerative change about
the knee primarily affecting the medial compartment. Vascular
calcifications are noted.
IMPRESSION: Soft tissue irregularity along the anterior shin likely reflecting
laceration. No retained radiopaque foreign body or acute osseous
abnormality.

## 2023-08-08 NOTE — Assessment & Plan Note (Signed)
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 -Status post BCG maintenance therapy, and bladder instillation of gemcitabine with TURBT twice -Given the high risk disease, and multiple recurrence, he received intravesical gemcitabine weekly for 6 treatments from 01/20/2023-02/24/2023 -He underwent repeated a cystoscopy on July 03, 2023, unfortunately he was found to have recurrence of bladder cancer: "2 cm bladder tumor at the right bladder neck completely resected. He also had some very superficial bladder tumor on the anterior bladder wall a little bit towards the right of midline" per Dr. Shannan Harper Op note. 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 guideline --Chemotherapy consent: Side effects including but not limited to, fatigue, pneumonitis, colitis, hepatitis, nephritis, carditis, pancreatitis, thyroid and other endocrine dysfunction, rash, arthralgia, muscular and neuro toxicities,  etc. were discussed with patient in great detail.  We discussed that most side effect of mild and manageable, but severe and life-threatening side effect could happen also and may require treatment and hospital admission. We discussed side effect happens in 40-60%.  Pt voiced good understanding and agrees to proceed. -the goal of therapy is curative to reduce his risk of recurrence.

## 2023-08-08 NOTE — Progress Notes (Unsigned)
[facility]   [phone] Fax:([phone]   Clinic Follow up Note   Jeremy Day Care Team: [name], MD as PCP - General (Family Medicine) [name], MD as PCP - Cardiology (Cardiology) [name], MD as Attending Physician (Hematology and Oncology) [name], MD as Consulting Physician (Urology)  Date of Service:  08/08/2023  CHIEF COMPLAINT: f/u of bladder cancer  CURRENT THERAPY:  Cancer monitoring  Assessment and plan  Malignant neoplasm of overlapping sites of bladder [facility]) 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 -Status post BCG maintenance therapy, and bladder instillation of gemcitabine with TURBT twice -Given the high risk disease, and multiple recurrence, he received intravesical gemcitabine weekly for 6 treatments from 01/20/2023-02/24/2023 -He underwent repeated a cystoscopy on July 03, 2023, unfortunately he was found to have recurrence of bladder cancer: "2 cm bladder tumor at the right bladder neck completely resected. He also had some very superficial bladder tumor on the anterior bladder wall a little bit towards the right of midline" per Dr. Shannan Harper Op note. 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 -We also discussed with the other option of continue cystoscopy surveillance every 3 months with endoscopy resection if he has recurrent disease.  I spoke with Jeremy urologist [name], since he has recurrence only showed low-grade papillary carcinoma, no high risk disease, I think it is reasonable to monitor for now.  Will hold on systemic treatment  Keytruda. -I will see him as needed, Jeremy Day knows to call me if he has any concerns.    PLAN -I reviewed Jeremy recent cystoscopy findings and biopsy results. -We decided to proceed continue cystoscopy surveillance, and hold on systemic treatment Keytruda for now. -I we will see him back as needed    SUMMARY OF ONCOLOGIC HISTORY: Oncology History  Malignant neoplasm of overlapping sites of bladder (HCC)  01/13/2023 Initial Diagnosis   Malignant neoplasm of overlapping sites of bladder (HCC)   01/20/2023 - 02/24/2023 Chemotherapy   Jeremy Day is on Treatment Plan : BLADDER Gemcitabine INTRAVESICAL (2000) q7d     08/14/2023 -  Chemotherapy   Jeremy Day is on Treatment Plan : BLADDER Pembrolizumab (200) q21d        Discussed the use of AI scribe software for clinical note transcription with the Jeremy Day, who gave verbal consent to proceed.  History of Present Illness   A 72 year old Jeremy Day with a history of bladder cancer presents for follow-up. The Jeremy Day had previously undergone gemcitabine bladder infusion and had a cystoscopy in August where multiple small tumors were found. The largest tumor, measuring about two centimeters, was removed and confirmed to be bladder cancer. The Jeremy Day reports no new symptoms since the last visit, with the exception of persistent urinary frequency and urgency. The Jeremy Day denies any pain, blood in the urine, or other symptoms. 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         All other systems were reviewed with the Jeremy Day and are negative.  MEDICAL HISTORY:  Past Medical History:  Diagnosis Date   Abnormal tandem walk    using 2 canes  due to lower back arthritis   Ascending aorta dilatation (HCC)    echo  11-04-2022 aortoc root mild dilation 45mm, ascending aorta mild dilation 44 mm   Bladder tumor    BPH (benign prostatic hyperplasia)    Cancer (HCC) 2020   bladder   Depression    Diverticulosis    Full  dentures    GERD (gastroesophageal reflux disease)    Hiatal hernia    History of colon polyps    History of staphylococcal infection 2008   right ankle  w/ sepsis   Meningitis spinal age 82 or 4   OA (osteoarthritis)    lower back, right ankle,, bilateral knees and shouldres   Pneumonia yrs ago   Pre-diabetes    Psoriasis    PVC's (premature ventricular contractions)    2012-- hx bigeminy/ trigeminy with near syncope (cardiology consult note in epic dated 2012)  (last cardiology visit in epic w/ dr Anne Fu, 03-27-2016)   Seasonal allergies    Urgency of urination     SURGICAL HISTORY: Past Surgical History:  Procedure Laterality Date   ANKLE ARTHROSCOPY Right 02-13-2007    dr Luiz Blare   w/ debridement and removal loose body   COLONOSCOPY  last one summer 2019   CYSTOSCOPY W/ RETROGRADES Bilateral 07/03/2023   Procedure: CYSTOSCOPY WITH BILATERAL RETROGRADE PYELOGRAM;  Surgeon: [name], MD;  Location: [facility];  Service: Urology;  Laterality: Bilateral;   INGUINAL HERNIA REPAIR Right 1980s   INGUINAL LYMPH NODE BIOPSY  2001  approx.   benign   lumbar back surgery  04/26/2023   MULTIPLE TOOTH EXTRACTIONS     SPINAL FUSION  08/2021   "T8 through sacral area".   TONSILLECTOMY     TRANSURETHRAL RESECTION OF BLADDER TUMOR N/A 02/06/2019   Procedure: TRANSURETHRAL RESECTION OF BLADDER TUMOR (TURBT);  Surgeon: [name], MD;  Location: [facility];  Service: Urology;  Laterality: N/A;   TRANSURETHRAL RESECTION OF BLADDER TUMOR N/A 02/20/2019   Procedure: TRANSURETHRAL RESECTION OF BLADDER TUMOR (TURBT);  Surgeon: [name], MD;  Location: [facility];  Service: Urology;  Laterality: N/A;   TRANSURETHRAL RESECTION OF BLADDER TUMOR N/A 03/13/2019   Procedure: TRANSURETHRAL RESECTION OF BLADDER TUMOR (TURBT);  Surgeon: [name], MD;  Location: [facility];  Service: Urology;  Laterality: N/A;   TRANSURETHRAL RESECTION OF BLADDER TUMOR N/A 11/20/2020    Procedure: TRANSURETHRAL RESECTION OF BLADDER TUMOR (TURBT) WITH INSTILLATION OF post-op GEMCITABINE;  Surgeon: Jannifer Hick, MD;  Location: [facility];  Service: Urology;  Laterality: N/A;   TRANSURETHRAL RESECTION OF BLADDER TUMOR WITH MITOMYCIN-C N/A 09/23/2019   Procedure: TRANSURETHRAL RESECTION OF BLADDER TUMOR WITH GEMCITABINE INSTILLATION INTO THE BLADDER;  Surgeon: [name], MD;  Location: [facility];  Service: Urology;  Laterality: N/A;   TRANSURETHRAL RESECTION OF BLADDER TUMOR WITH MITOMYCIN-C N/A 06/24/2020   Procedure: TRANSURETHRAL RESECTION OF BLADDER TUMOR WITH POST OPERATIVE GEMCITABINE INSTILLATION;  Surgeon: [name], MD;  Location: [facility] ;  Service: Urology;  Laterality: N/A;   TRANSURETHRAL RESECTION OF BLADDER TUMOR WITH MITOMYCIN-C Bilateral 11/21/2022   Procedure: CYSTOSCOPY BILATERAL RETROGRADE PYELOGRAM TRANSURETHRAL RESECTION OF BLADDER TUMOR WITH GEMCITABINE;  Surgeon: [name], MD;  Location: [facility];  Service: Urology;  Laterality: Bilateral;  1 HR FOR CASE    I have reviewed the social history and family history with the Jeremy Day and they are unchanged from  previous note.  ALLERGIES:  has No Known Allergies.  MEDICATIONS:  Current Outpatient Medications  Medication Sig Dispense Refill   acetaminophen (TYLENOL) 500 MG tablet Take 1,000 mg by mouth every 6 (six) hours as needed for moderate pain.     calcium carbonate (TUMS - DOSED IN MG ELEMENTAL CALCIUM) 500 MG chewable tablet Chew 1 tablet by mouth daily as needed for indigestion or heartburn.     diphenhydrAMINE (BENADRYL) 25 MG tablet Take 25 mg by mouth every 6 (six) hours as needed for allergies.     famotidine (PEPCID) 20 MG tablet Take 10 mg by mouth every morning.     HYDROcodone-acetaminophen (NORCO/VICODIN) 5-325 MG tablet Take 1-2 tablets by mouth every 6 (six) hours as needed for moderate pain or severe pain. 8 tablet 0    meclizine (ANTIVERT) 25 MG tablet Take 25 mg by mouth at bedtime.     metoprolol succinate (TOPROL-XL) 25 MG 24 hr tablet TAKE 1/2 TABLET(12.5 MG) BY MOUTH DAILY (Jeremy Day taking differently: every evening.) 45 tablet 2   mometasone (NASONEX) 50 MCG/ACT nasal spray Place 2 sprays into the nose daily as needed (allergies).     pimecrolimus (ELIDEL) 1 % cream Apply 1 application  topically daily as needed (eczema).     Polyethyl Glycol-Propyl Glycol (SYSTANE OP) Place 1 drop into both eyes 2 (two) times daily as needed (dry eyes).     tamsulosin (FLOMAX) 0.4 MG CAPS capsule Take 0.4 mg by mouth daily. (Jeremy Day not taking: Reported on 06/22/2023)     triamcinolone ointment (KENALOG) 0.1 % Apply 1 Application topically daily as needed (psoriasis).     No current facility-administered medications for this visit.    PHYSICAL EXAMINATION: ECOG PERFORMANCE STATUS: 0 - Asymptomatic  Vitals:   08/08/23 1104  BP: 113/83  Pulse: (!) 51  Resp: 17  Temp: 97.6 F (36.4 C)  SpO2: 96%   Wt Readings from Last 3 Encounters:  08/08/23 200 lb 3.2 oz (90.8 kg)  07/03/23 198 lb 8 oz (90 kg)  02/24/23 198 lb 6.4 oz (90 kg)     GENERAL:alert, no distress and comfortable SKIN: skin color, texture, turgor are normal, no rashes or significant lesions EYES: normal, Conjunctiva are pink and non-injected, sclera clear NECK: supple, thyroid normal size, non-tender, without nodularity LYMPH:  no palpable lymphadenopathy in the cervical, axillary  LUNGS: clear to auscultation and percussion with normal breathing effort HEART: regular rate & rhythm and no murmurs and no lower extremity edema ABDOMEN:abdomen soft, non-tender and normal bowel sounds Musculoskeletal:no cyanosis of digits and no clubbing  NEURO: alert & oriented x 3 with fluent speech, no focal motor/sensory deficits   LABORATORY DATA:  I have reviewed the data as listed    Latest Ref Rng & Units 08/08/2023   10:49 AM 07/03/2023    6:26 AM  02/24/2023    8:07 AM  CBC  WBC 4.0 - 10.5 K/uL 7.7   5.3   Hemoglobin 13.0 - 17.0 g/dL 16.1  09.6  04.5   Hematocrit 39.0 - 52.0 % 41.2  41.0  45.0   Platelets 150 - 400 K/uL 209   164         Latest Ref Rng & Units 08/08/2023   10:49 AM 07/03/2023    6:26 AM 02/24/2023    8:07 AM  CMP  Glucose 70 - 99 mg/dL 81  96  409   BUN 8 - 23 mg/dL 15  10  10    Creatinine 0.61 -  1.24 mg/dL 1.61  0.96  0.45   Sodium 135 - 145 mmol/L 138  140  137   Potassium 3.5 - 5.1 mmol/L 4.5  3.8  3.9   Chloride 98 - 111 mmol/L 105  101  106   CO2 22 - 32 mmol/L 30   27   Calcium 8.9 - 10.3 mg/dL 9.3   9.4   Total Protein 6.5 - 8.1 g/dL 6.7   6.9   Total Bilirubin 0.3 - 1.2 mg/dL 0.5   0.6   Alkaline Phos 38 - 126 U/L 71   76   AST 15 - 41 U/L 18   18   ALT 0 - 44 U/L 10   13       RADIOGRAPHIC STUDIES: I have personally reviewed the radiological images as listed and agreed with the findings in the report. No results found.    Orders Placed This Encounter  Procedures   CBC with Differential (Cancer Center Only)    Standing Status:   Future    Standing Expiration Date:   08/13/2024   CMP (Cancer Center only)    Standing Status:   Future    Standing Expiration Date:   08/13/2024   T4    Standing Status:   Future    Standing Expiration Date:   08/13/2024   TSH    Standing Status:   Future    Standing Expiration Date:   08/13/2024   CBC with Differential (Cancer Center Only)    Standing Status:   Future    Standing Expiration Date:   09/03/2024   CMP (Cancer Center only)    Standing Status:   Future    Standing Expiration Date:   09/03/2024   CBC with Differential (Cancer Center Only)    Standing Status:   Future    Standing Expiration Date:   09/24/2024   CMP (Cancer Center only)    Standing Status:   Future    Standing Expiration Date:   09/24/2024   T4    Standing Status:   Future    Standing Expiration Date:   09/24/2024   TSH    Standing Status:   Future    Standing Expiration  Date:   09/24/2024   All questions were answered. The Jeremy Day knows to call the clinic with any problems, questions or concerns. No barriers to learning was detected. The total time spent in the appointment was 30 minutes.     [name], MD 08/08/2023

## 2023-08-09 ENCOUNTER — Other Ambulatory Visit: Payer: Self-pay | Admitting: *Deleted

## 2023-08-09 ENCOUNTER — Other Ambulatory Visit: Payer: Self-pay

## 2023-08-09 ENCOUNTER — Telehealth: Payer: Self-pay

## 2023-08-09 NOTE — Telephone Encounter (Signed)
Contacted [facility] ([name]) per Dr. Latanya Maudlin request via Secure chat.  Requested that [name] would like for [name] to contact her to discuss this mutual patient.  Receptionist stated that [name] is in surgery today but provided this nurse with Dr. Shannan Harper pager number.  Provided [name] with the pager number.

## 2023-08-10 ENCOUNTER — Other Ambulatory Visit: Payer: PPO

## 2023-08-14 ENCOUNTER — Inpatient Hospital Stay: Payer: PPO | Admitting: Hematology

## 2023-08-14 ENCOUNTER — Inpatient Hospital Stay: Payer: PPO

## 2023-09-04 ENCOUNTER — Other Ambulatory Visit: Payer: PPO

## 2023-09-04 ENCOUNTER — Ambulatory Visit: Payer: PPO

## 2023-09-04 ENCOUNTER — Ambulatory Visit: Payer: PPO | Admitting: Hematology

## 2023-09-25 ENCOUNTER — Ambulatory Visit: Payer: PPO | Admitting: Hematology

## 2023-09-25 ENCOUNTER — Ambulatory Visit: Payer: PPO

## 2023-09-25 ENCOUNTER — Other Ambulatory Visit: Payer: PPO

## 2023-10-09 DIAGNOSIS — L570 Actinic keratosis: Secondary | ICD-10-CM | POA: Diagnosis not present

## 2023-10-24 DIAGNOSIS — C678 Malignant neoplasm of overlapping sites of bladder: Secondary | ICD-10-CM | POA: Diagnosis not present

## 2023-10-25 ENCOUNTER — Other Ambulatory Visit: Payer: Self-pay | Admitting: Urology

## 2023-10-26 NOTE — Patient Instructions (Signed)
SURGICAL WAITING ROOM VISITATION  Patients having surgery or a procedure may have no more than 2 support people in the waiting area - these visitors may rotate.    Children under the age of 68 must have an adult with them who is not the patient.  Due to an increase in RSV and influenza rates and associated hospitalizations, children ages 53 and under may not visit patients in [facility] hospitals.  If the patient needs to stay at the hospital during part of their recovery, the visitor guidelines for inpatient rooms apply. Pre-op nurse will coordinate an appropriate time for 1 support person to accompany patient in pre-op.  This support person may not rotate.    Please refer to the [facility] website for the visitor guidelines for Inpatients (after your surgery is over and you are in a regular room).       Your procedure is scheduled on: 11/09/22   Report to [facility] Main Entrance    Report to admitting at 11 AM   Call this number if you have problems the morning of surgery [phone]   Do not eat food or drink liquids  :After Midnight.   Oral Hygiene is also important to reduce your risk of infection.                                    Remember - BRUSH YOUR TEETH THE MORNING OF SURGERY WITH YOUR REGULAR TOOTHPASTE  DENTURES WILL BE REMOVED PRIOR TO SURGERY PLEASE DO NOT APPLY "Poly grip" OR ADHESIVES!!!   Stop all vitamins and herbal supplements 7 days before surgery.   Take these medicines the morning of surgery with A SIP OF WATER: Famotidine, Metoprolol, Tylenol if needed.             You may not have any metal on your body including hair pins, jewelry, and body piercing             Do not wear make-up, lotions, powders, perfumes/cologne, or deodorant              Men may shave face and neck.   Do not bring valuables to the hospital. [facility] IS NOT             RESPONSIBLE   FOR VALUABLES.   Contacts, glasses, dentures or bridgework may not be worn into  surgery.  DO NOT BRING YOUR HOME MEDICATIONS TO THE HOSPITAL. PHARMACY WILL DISPENSE MEDICATIONS LISTED ON YOUR MEDICATION LIST TO YOU DURING YOUR ADMISSION IN THE HOSPITAL!    Patients discharged on the day of surgery will not be allowed to drive home.  Someone NEEDS to stay with you for the first 24 hours after anesthesia.   Special Instructions: Bring a copy of your healthcare power of attorney and living will documents the day of surgery if you haven't scanned them before.              Please read over the following fact sheets you were given: IF YOU HAVE QUESTIONS ABOUT YOUR PRE-OP INSTRUCTIONS PLEASE CALL [phone] Rosey [facility]  If you received a COVID test during your pre-op visit  it is requested that you wear a mask when out in public, stay away from anyone that may not be feeling well and notify your surgeon if you develop symptoms. If you test positive for Covid or have been in contact with anyone that has tested  positive in the last 10 days please notify you surgeon.    [place] - Preparing for Surgery Before surgery, you can play an important role.  Because skin is not sterile, your skin needs to be as free of germs as possible.  You can reduce the number of germs on your skin by washing with CHG (chlorahexidine gluconate) soap before surgery.  CHG is an antiseptic cleaner which kills germs and bonds with the skin to continue killing germs even after washing. Please DO NOT use if you have an allergy to CHG or antibacterial soaps.  If your skin becomes reddened/irritated stop using the CHG and inform your nurse when you arrive at Short Stay. Do not shave (including legs and underarms) for at least 48 hours prior to the first CHG shower.  You may shave your face/neck.  Please follow these instructions carefully:  1.  Shower with CHG Soap the night before surgery and the  morning of surgery.  2.  If you choose to wash your hair, wash your hair first as usual with your normal   shampoo.  3.  After you shampoo, rinse your hair and body thoroughly to remove the shampoo.                             4.  Use CHG as you would any other liquid soap.  You can apply chg directly to the skin and wash.  Gently with a scrungie or clean washcloth.  5.  Apply the CHG Soap to your body ONLY FROM THE NECK DOWN.   Do   not use on face/ open                           Wound or open sores. Avoid contact with eyes, ears mouth and   genitals (private parts).                       Wash face,  Genitals (private parts) with your normal soap.             6.  Wash thoroughly, paying special attention to the area where your    surgery  will be performed.  7.  Thoroughly rinse your body with warm water from the neck down.  8.  DO NOT shower/wash with your normal soap after using and rinsing off the CHG Soap.                9.  Pat yourself dry with a clean towel.            10.  Wear clean pajamas.            11.  Place clean sheets on your bed the night of your first shower and do not  sleep with pets. Day of Surgery : Do not apply any lotions/deodorants the morning of surgery.  Please wear clean clothes to the hospital/surgery center.  FAILURE TO FOLLOW THESE INSTRUCTIONS MAY RESULT IN THE CANCELLATION OF YOUR SURGERY  PATIENT SIGNATURE_________________________________  NURSE SIGNATURE__________________________________  ________________________________________________________________________

## 2023-10-26 NOTE — Progress Notes (Addendum)
COVID Vaccine received:  []  No [x]  Yes Date of any COVID positive Test in last 90 days: no PCP - Garth Bigness MD Cardiologist - [name] MD  Chest x-ray -  EKG -  11/14/22 Epic Stress Test -  ECHO - 11/04/22 Epic Cardiac Cath -   Bowel Prep - [x]  No  []   Yes ______  Pacemaker / ICD device [x]  No []  Yes   Spinal Cord Stimulator:[x]  No []  Yes       History of Sleep Apnea? [x]  No []  Yes   CPAP used?- [x]  No []  Yes    Does the patient monitor blood sugar?          [x]  No []  Yes  []  N/A  Patient has: [x]  NO Hx DM   []  Pre-DM                 []  DM1  []   DM2 Does patient have a [employer] or Dexacom? []  No []  Yes   Fasting Blood Sugar Ranges-  Checks Blood Sugar _____ times a day  GLP1 agonist / usual dose - no GLP1 instructions:  SGLT-2 inhibitors / usual dose - no SGLT-2 instructions:   Blood Thinner / Instructions:no Aspirin Instructions:no  Comments:   Activity level: Patient is able to climb a flight of stairs without difficulty; [x]  No CP  [x]  No SOB,  ___   Patient can perform ADLs without assistance.   Anesthesia review: Prediabetic, Ascending aorta dilation  Patient denies shortness of breath, fever, cough and chest pain at PAT appointment.  Patient verbalized understanding and agreement to the Pre-Surgical Instructions that were given to them at this PAT appointment. Patient was also educated of the need to review these PAT instructions again prior to his/her surgery.I reviewed the appropriate phone numbers to call if they have any and questions or concerns.

## 2023-10-30 ENCOUNTER — Encounter (HOSPITAL_COMMUNITY): Payer: Self-pay

## 2023-10-30 ENCOUNTER — Other Ambulatory Visit: Payer: Self-pay

## 2023-10-30 ENCOUNTER — Encounter (HOSPITAL_COMMUNITY)
Admission: RE | Admit: 2023-10-30 | Discharge: 2023-10-30 | Disposition: A | Discharge status: Home / Self Care | Payer: PPO | Source: Ambulatory Visit | Attending: Urology | Admitting: Urology

## 2023-10-30 VITALS — BP 124/100 | HR 64 | Temp 97.6°F | Resp 16 | Ht 72.0 in | Wt 200.0 lb

## 2023-10-30 DIAGNOSIS — Z8551 Personal history of malignant neoplasm of bladder: Secondary | ICD-10-CM | POA: Insufficient documentation

## 2023-10-30 DIAGNOSIS — K449 Diaphragmatic hernia without obstruction or gangrene: Secondary | ICD-10-CM | POA: Diagnosis not present

## 2023-10-30 DIAGNOSIS — Z01812 Encounter for preprocedural laboratory examination: Secondary | ICD-10-CM | POA: Diagnosis not present

## 2023-10-30 DIAGNOSIS — R7303 Prediabetes: Secondary | ICD-10-CM | POA: Insufficient documentation

## 2023-10-30 DIAGNOSIS — K219 Gastro-esophageal reflux disease without esophagitis: Secondary | ICD-10-CM | POA: Diagnosis not present

## 2023-10-30 DIAGNOSIS — N400 Enlarged prostate without lower urinary tract symptoms: Secondary | ICD-10-CM | POA: Diagnosis not present

## 2023-10-30 DIAGNOSIS — Z01818 Encounter for other preprocedural examination: Secondary | ICD-10-CM

## 2023-10-30 DIAGNOSIS — Z87891 Personal history of nicotine dependence: Secondary | ICD-10-CM | POA: Diagnosis not present

## 2023-10-30 HISTORY — DX: Essential (primary) hypertension: I10

## 2023-10-30 LAB — CBC
HCT: 44.1 % (ref 39.0–52.0)
Hemoglobin: 13.8 g/dL (ref 13.0–17.0)
MCH: 28.4 pg (ref 26.0–34.0)
MCHC: 31.3 g/dL (ref 30.0–36.0)
MCV: 90.7 fL (ref 80.0–100.0)
Platelets: 222 10*3/uL (ref 150–400)
RBC: 4.86 MIL/uL (ref 4.22–5.81)
RDW: 16.8 % — ABNORMAL HIGH (ref 11.5–15.5)
WBC: 8 10*3/uL (ref 4.0–10.5)
nRBC: 0 % (ref 0.0–0.2)

## 2023-11-02 NOTE — Anesthesia Preprocedure Evaluation (Signed)
Anesthesia Evaluation    Airway        Dental   Pulmonary former smoker          Cardiovascular hypertension,      Neuro/Psych    GI/Hepatic   Endo/Other    Renal/GU      Musculoskeletal   Abdominal   Peds  Hematology   Anesthesia Other Findings   Reproductive/Obstetrics                              Anesthesia Physical Anesthesia Plan  ASA:   Anesthesia Plan:    Post-op Pain Management:    Induction:   PONV Risk Score and Plan:   Airway Management Planned:   Additional Equipment:   Intra-op Plan:   Post-operative Plan:   Informed Consent:   Plan Discussed with:   Anesthesia Plan Comments: (See PAT note from 12/23 by Sherlie Ban PA-C )         Anesthesia Quick Evaluation

## 2023-11-02 NOTE — Progress Notes (Signed)
Case: [id number] Date/Time: 11/10/23 1259   Procedure: TRANSURETHRAL RESECTION OF BLADDER TUMOR (TURBT) with GEMCITABINE AND BILATERAL RETROGRADE PYELOGRAM (Bilateral)   Anesthesia type: General   Pre-op diagnosis: Bladder Tumor   Location: [facility]   Surgeons: Jeremy Elliot, MD       DISCUSSION: Jeremy Day is a 72 yo male who presents to PAT prior to surgery above. PMH of former smoking, bladder cancer s/p multiple TURBT, BPH, GERD, hiatal hernia, pre-diabetes, PVC's, dilated aortic root (45 mm on Echo 10/2022)  Patient follows with Cardiology for hx of ventricular bigeminy and aortic root dilatation. Last seen in clinic on 11/16/22 for pre op clearance for similar surgery on 11/21/2022. He was deemed low risk and was cleared for surgery at that time. He was scheduled for back surgery in May 2024 and was seen via tele visit and was cleared for that surgery as well. Echo in 10/2022 showed slightly increased aortic root from prior. Good BP control was recommended. Last TURBT was on 07/03/23 and was uncomplicated.   Patient follows with Oncology for recurrent bladder cancer. Last seen by Dr. Mosetta Day on 08/08/23. "I spoke with his urologist Dr. Alvester Day, since he has recurrence only showed low-grade papillary carcinoma, no high risk disease, I think it is reasonable to monitor for now.  Will hold on systemic treatment Keytruda." He was advised to f/u on as needed basis.  VS: BP (!) 124/100   Pulse 64   Temp 36.4 C (Oral)   Resp 16   Ht 6' (1.829 m)   Wt 90.7 kg   SpO2 99%   BMI 27.12 kg/m   PROVIDERS: [name], MD Primary Cardiologist:  Jeremy Schultz, MD  LABS: Labs reviewed: Acceptable for surgery. (all labs ordered are listed, but only abnormal results are displayed)  Labs Reviewed  CBC - Abnormal; Notable for the following components:      Result Value   RDW 16.8 (*)    All other components within normal limits     IMAGES:   EKG:   CV: Echo  11/04/2022:  IMPRESSIONS    1. Left ventricular ejection fraction, by estimation, is 65 to 70%. Left ventricular ejection fraction by 3D volume is 68 %. The left ventricle has normal function. The left ventricle has no regional wall motion abnormalities. There is moderate left ventricular hypertrophy of the basal-septal segment. Left ventricular diastolic parameters are consistent with Grade I diastolic dysfunction (impaired relaxation). The average left ventricular global longitudinal strain is -16.6 %. The global longitudinal strain is abnormal.  2. Right ventricular systolic function is normal. The right ventricular size is normal. There is normal pulmonary artery systolic pressure. The estimated right ventricular systolic pressure is 17.6 mmHg.  3. The mitral valve is normal in structure. Trivial mitral valve regurgitation.  4. The aortic valve is tricuspid. There is mild thickening of the aortic valve. Aortic valve regurgitation is trivial. Aortic valve sclerosis is present, with no evidence of aortic valve stenosis.  5. Aortic dilatation noted. There is mild dilatation of the aortic root, measuring 45 mm. There is mild dilatation of the ascending aorta, measuring 44 mm.  6. The inferior vena cava is normal in size with greater than 50% respiratory variability, suggesting right atrial pressure of 3 mmHg.  Comparison(s): No significant change from prior study.  Past Medical History:  Diagnosis Date   Abnormal tandem walk    using 2 canes due to lower back arthritis   Ascending  aorta dilatation (HCC)    echo  11-04-2022 aortoc root mild dilation 45mm, ascending aorta mild dilation 44 mm   Bladder tumor    BPH (benign prostatic hyperplasia)    Cancer (HCC) 2020   bladder   Depression    Diverticulosis    Full dentures    GERD (gastroesophageal reflux disease)    Hiatal hernia    History of colon polyps    History of staphylococcal infection 2008   right ankle  w/  sepsis   Hypertension    Meningitis spinal age 11 or 4   OA (osteoarthritis)    lower back, right ankle,, bilateral knees and shouldres   Pneumonia yrs ago   Pre-diabetes    Psoriasis    PVC's (premature ventricular contractions)    2012-- hx bigeminy/ trigeminy with near syncope (cardiology consult note in epic dated 2012)  (last cardiology visit in epic w/ dr Anne Day, 03-27-2016)   Seasonal allergies    Urgency of urination     Past Surgical History:  Procedure Laterality Date   ANKLE ARTHROSCOPY Right 02-13-2007    dr Luiz Blare   w/ debridement and removal loose body   COLONOSCOPY  last one summer 2019   CYSTOSCOPY W/ RETROGRADES Bilateral 07/03/2023   Procedure: CYSTOSCOPY WITH BILATERAL RETROGRADE PYELOGRAM;  Surgeon: Jeremy Elliot, MD;  Location: [facility];  Service: Urology;  Laterality: Bilateral;   INGUINAL HERNIA REPAIR Right 1980s   INGUINAL LYMPH NODE BIOPSY  2001  approx.   benign   lumbar back surgery  04/26/2023   MULTIPLE TOOTH EXTRACTIONS     SPINAL FUSION  08/2021   "T8 through sacral area".   TONSILLECTOMY     TRANSURETHRAL RESECTION OF BLADDER TUMOR N/A 02/06/2019   Procedure: TRANSURETHRAL RESECTION OF BLADDER TUMOR (TURBT);  Surgeon: Jeremy Elliot, MD;  [facility];  Service: Urology;  Laterality: N/A;   TRANSURETHRAL RESECTION OF BLADDER TUMOR N/A 02/20/2019   Procedure: TRANSURETHRAL RESECTION OF BLADDER TUMOR (TURBT);  Surgeon: Jeremy Elliot, MD;  [facility];  Service: Urology;  Laterality: N/A;   TRANSURETHRAL RESECTION OF BLADDER TUMOR N/A 03/13/2019   Procedure: TRANSURETHRAL RESECTION OF BLADDER TUMOR (TURBT);  Surgeon: Jeremy Elliot, MD;  [facility];  Service: Urology;  Laterality: N/A;   TRANSURETHRAL RESECTION OF BLADDER TUMOR N/A 11/20/2020   Procedure: TRANSURETHRAL RESECTION OF BLADDER TUMOR (TURBT) WITH INSTILLATION OF post-op GEMCITABINE;  Surgeon: Jeremy Hick, MD;  Location: [facility];  Service: Urology;  Laterality: N/A;   TRANSURETHRAL RESECTION OF BLADDER TUMOR WITH MITOMYCIN-C N/A 09/23/2019   Procedure: TRANSURETHRAL RESECTION OF BLADDER TUMOR WITH GEMCITABINE INSTILLATION INTO THE BLADDER;  Surgeon: Jeremy Elliot, MD;  Location: [facility];  Service: Urology;  Laterality: N/A;   TRANSURETHRAL RESECTION OF BLADDER TUMOR WITH MITOMYCIN-C N/A 06/24/2020   Procedure: TRANSURETHRAL RESECTION OF BLADDER TUMOR WITH POST OPERATIVE GEMCITABINE INSTILLATION;  Surgeon: Jeremy Elliot, MD;  Location: [facility];  Service: Urology;  Laterality: N/A;   TRANSURETHRAL RESECTION OF BLADDER TUMOR WITH MITOMYCIN-C Bilateral 11/21/2022   Procedure: CYSTOSCOPY BILATERAL RETROGRADE PYELOGRAM TRANSURETHRAL RESECTION OF BLADDER TUMOR WITH GEMCITABINE;  Surgeon: Jeremy Elliot, MD;  [facility];  Service: Urology;  Laterality: Bilateral;  1 HR FOR CASE    MEDICATIONS:  acetaminophen (TYLENOL) 500 MG tablet   calcium carbonate (TUMS - DOSED IN MG ELEMENTAL CALCIUM) 500 MG chewable tablet  diphenhydrAMINE (BENADRYL) 25 MG tablet   famotidine (PEPCID) 20 MG tablet   HYDROcodone-acetaminophen (NORCO/VICODIN) 5-325 MG tablet   meclizine (ANTIVERT) 25 MG tablet   metoprolol succinate (TOPROL-XL) 25 MG 24 hr tablet   mometasone (NASONEX) 50 MCG/ACT nasal spray   pimecrolimus (ELIDEL) 1 % cream   Polyethyl Glycol-Propyl Glycol (SYSTANE OP)   triamcinolone ointment (KENALOG) 0.1 %   No current facility-administered medications for this encounter.   Marcille Blanco [facility] Short Stay/Anesthesiology [facility] Phone [phone] 11/02/2023 10:50 AM

## 2023-11-10 ENCOUNTER — Ambulatory Visit (HOSPITAL_BASED_OUTPATIENT_CLINIC_OR_DEPARTMENT_OTHER): Payer: PPO | Admitting: Anesthesiology

## 2023-11-10 ENCOUNTER — Ambulatory Visit (HOSPITAL_COMMUNITY): Payer: PPO | Admitting: Medical

## 2023-11-10 ENCOUNTER — Encounter (HOSPITAL_COMMUNITY)
Admission: RE | Disposition: A | Discharge status: Home / Self Care | Payer: Self-pay | Source: Home / Self Care | Attending: Urology

## 2023-11-10 ENCOUNTER — Ambulatory Visit (HOSPITAL_COMMUNITY)
Admission: RE | Admit: 2023-11-10 | Discharge: 2023-11-10 | Disposition: A | Discharge status: Home / Self Care | Payer: PPO | Attending: Urology | Admitting: Urology

## 2023-11-10 ENCOUNTER — Ambulatory Visit (HOSPITAL_COMMUNITY): Payer: PPO

## 2023-11-10 ENCOUNTER — Encounter (HOSPITAL_COMMUNITY): Payer: Self-pay | Admitting: Urology

## 2023-11-10 ENCOUNTER — Other Ambulatory Visit: Payer: Self-pay

## 2023-11-10 DIAGNOSIS — Z87891 Personal history of nicotine dependence: Secondary | ICD-10-CM | POA: Insufficient documentation

## 2023-11-10 DIAGNOSIS — R3912 Poor urinary stream: Secondary | ICD-10-CM | POA: Diagnosis not present

## 2023-11-10 DIAGNOSIS — I10 Essential (primary) hypertension: Secondary | ICD-10-CM | POA: Diagnosis not present

## 2023-11-10 DIAGNOSIS — D494 Neoplasm of unspecified behavior of bladder: Secondary | ICD-10-CM | POA: Diagnosis present

## 2023-11-10 DIAGNOSIS — R310 Gross hematuria: Secondary | ICD-10-CM | POA: Diagnosis not present

## 2023-11-10 DIAGNOSIS — C679 Malignant neoplasm of bladder, unspecified: Secondary | ICD-10-CM

## 2023-11-10 DIAGNOSIS — R351 Nocturia: Secondary | ICD-10-CM | POA: Diagnosis not present

## 2023-11-10 DIAGNOSIS — Z01818 Encounter for other preprocedural examination: Secondary | ICD-10-CM

## 2023-11-10 LAB — SURGICAL PCR SCREEN
MRSA, PCR: NEGATIVE
Staphylococcus aureus: NEGATIVE

## 2023-11-10 SURGERY — TURBT, WITH CHEMOTHERAPEUTIC AGENT INSTILLATION INTO BLADDER
Anesthesia: General | Laterality: Bilateral

## 2023-11-10 MED ORDER — SODIUM CHLORIDE 0.9 % IR SOLN
Status: DC | PRN
  Administered 2023-11-10: 3000 mL

## 2023-11-10 MED ORDER — ORAL CARE MOUTH RINSE: 15.0000 mL | Freq: Once | OROMUCOSAL | Status: DC

## 2023-11-10 MED ORDER — PROPOFOL 10 MG/ML IV BOLUS
INTRAVENOUS | Status: DC | PRN
  Administered 2023-11-10: 150 mg via INTRAVENOUS
  Administered 2023-11-10: 50 mg via INTRAVENOUS

## 2023-11-10 MED ORDER — OXYCODONE HCL 5 MG/5ML PO SOLN: 5.0000 mg | Freq: Once | ORAL | Status: DC | PRN

## 2023-11-10 MED ORDER — FENTANYL CITRATE PF 50 MCG/ML IJ SOSY
25.0000 ug | PREFILLED_SYRINGE | INTRAMUSCULAR | Status: DC | PRN
Start: 2023-11-10 — End: 2023-11-10

## 2023-11-10 MED ORDER — ONDANSETRON HCL 4 MG/2ML IJ SOLN: 4.0000 mg | Freq: Once | INTRAMUSCULAR | Status: DC | PRN

## 2023-11-10 MED ORDER — IOHEXOL 300 MG/ML  SOLN
INTRAMUSCULAR | Status: DC | PRN
  Administered 2023-11-10: 18 mL

## 2023-11-10 MED ORDER — ACETAMINOPHEN 325 MG PO TABS: 325.0000 mg | ORAL_TABLET | ORAL | Status: DC | PRN

## 2023-11-10 MED ORDER — OXYCODONE HCL 5 MG PO TABS: 5.0000 mg | ORAL_TABLET | Freq: Once | ORAL | Status: DC | PRN

## 2023-11-10 MED ORDER — EPHEDRINE SULFATE-NACL 50-0.9 MG/10ML-% IV SOSY
PREFILLED_SYRINGE | INTRAVENOUS | Status: DC | PRN
  Administered 2023-11-10 (×2): 5 mg via INTRAVENOUS

## 2023-11-10 MED ORDER — GEMCITABINE CHEMO FOR BLADDER INSTILLATION 2000 MG
2000.0000 mg | Freq: Once | INTRAVENOUS | Status: AC
  Administered 2023-11-10: 2000 mg via INTRAVESICAL
  Filled 2023-11-10: qty 2000

## 2023-11-10 MED ORDER — CHLORHEXIDINE GLUCONATE 0.12 % MT SOLN: 15.0000 mL | Freq: Once | OROMUCOSAL | Status: DC

## 2023-11-10 MED ORDER — CEFAZOLIN SODIUM-DEXTROSE 2-4 GM/100ML-% IV SOLN
2.0000 g | INTRAVENOUS | Status: AC
  Administered 2023-11-10: 2 g via INTRAVENOUS
  Filled 2023-11-10: qty 100

## 2023-11-10 MED ORDER — ACETAMINOPHEN 160 MG/5ML PO SOLN: 325.0000 mg | ORAL | Status: DC | PRN

## 2023-11-10 MED ORDER — FENTANYL CITRATE (PF) 100 MCG/2ML IJ SOLN
INTRAMUSCULAR | Status: DC | PRN
  Administered 2023-11-10: 100 ug via INTRAVENOUS

## 2023-11-10 MED ORDER — LACTATED RINGERS IV SOLN: INTRAVENOUS | Status: DC

## 2023-11-10 MED ORDER — ONDANSETRON HCL 4 MG/2ML IJ SOLN
INTRAMUSCULAR | Status: DC | PRN
  Administered 2023-11-10: 4 mg via INTRAVENOUS

## 2023-11-10 MED ORDER — MEPERIDINE HCL 50 MG/ML IJ SOLN
6.2500 mg | INTRAMUSCULAR | Status: DC | PRN
Start: 2023-11-10 — End: 2023-11-10

## 2023-11-10 MED ORDER — LIDOCAINE HCL (CARDIAC) PF 100 MG/5ML IV SOSY
PREFILLED_SYRINGE | INTRAVENOUS | Status: DC | PRN
  Administered 2023-11-10: 100 mg via INTRATRACHEAL

## 2023-11-10 MED ORDER — FENTANYL CITRATE (PF) 100 MCG/2ML IJ SOLN
INTRAMUSCULAR | Status: AC
  Filled 2023-11-10: qty 2

## 2023-11-10 SURGICAL SUPPLY — 16 items
BAG URINE DRAIN 2000ML AR STRL (UROLOGICAL SUPPLIES) IMPLANT
BAG URO CATCHER STRL LF (MISCELLANEOUS) ×2 IMPLANT
CATH FOLEY 2WAY SLVR 5CC 18FR (CATHETERS) IMPLANT
DRAPE FOOT SWITCH (DRAPES) ×2 IMPLANT
ELECT REM PT RETURN 15FT ADLT (MISCELLANEOUS) ×2 IMPLANT
GLOVE BIO SURGEON STRL SZ7.5 (GLOVE) ×2 IMPLANT
GOWN STRL REUS W/ TWL XL LVL3 (GOWN DISPOSABLE) ×2 IMPLANT
KIT TURNOVER KIT A (KITS) IMPLANT
LOOP CUT BIPOLAR 24F LRG (ELECTROSURGICAL) IMPLANT
MANIFOLD NEPTUNE II (INSTRUMENTS) ×2 IMPLANT
PACK CYSTO (CUSTOM PROCEDURE TRAY) ×2 IMPLANT
PLUG CATH AND CAP STRL 200 (CATHETERS) IMPLANT
SYR TOOMEY IRRIG 70ML (MISCELLANEOUS) IMPLANT
SYRINGE TOOMEY IRRIG 70ML (MISCELLANEOUS) IMPLANT
TUBING CONNECTING 10 (TUBING) ×2 IMPLANT
TUBING UROLOGY SET (TUBING) ×2 IMPLANT

## 2023-11-10 NOTE — Anesthesia Procedure Notes (Signed)
 Procedure Name: LMA Insertion Date/Time: 11/10/2023 1:18 PM  Performed by: Delores Duwaine SAUNDERS, CRNAPre-anesthesia Checklist: Patient identified, Emergency Drugs available, Suction available and Patient being monitored Patient Re-evaluated:Patient Re-evaluated prior to induction Oxygen Delivery Method: Circle System Utilized Preoxygenation: Pre-oxygenation with 100% oxygen Induction Type: IV induction Ventilation: Mask ventilation without difficulty LMA: LMA inserted LMA Size: 4.0 Number of attempts: 1 Airway Equipment and Method: Bite block Placement Confirmation: positive ETCO2 Tube secured with: Tape Dental Injury: Teeth and Oropharynx as per pre-operative assessment

## 2023-11-10 NOTE — Op Note (Signed)
 Operative Note  Preoperative diagnosis:  1.  Bladder tumor  Postoperative diagnosis: 1.  Bladder tumor-medium  Procedure(s): 1.  Cystoscopy with bilateral retrograde pyelogram 2.  Transurethral resection of bladder tumor--medium 3.  Intravesical instillation of gemcitabine   Surgeon: Sherwood Edison, MD  Assistants: None  Anesthesia: General  Complications: None immediate  EBL: Minimal  Specimens: 1.  Bladder tumor  Drains/Catheters: 1.  18 French Foley catheter  Intraoperative findings: 1.  Normal urethra 2.  Moderately obstructing prostate with bilobar hypertrophy.  No large median lobe. 3.  Bladder mucosa with approximately 2 cm superficial bladder tumor at the bladder neck that was completely resected.  Separate subcentimeter satellite lesion just to the left of it.  4.  Right retrograde pyelogram without any filling defect or hydronephrosis  5.  Left retrograde pyelogram without any filling defect or hydronephrosis  Indication: 73 year old male with history of bladder cancer status post multiple resections presents with another recurrence.  Description of procedure:  The patient was identified and consent was obtained.  The patient was taken to the operating room and placed in the supine position.  The patient was placed under general anesthesia.  Perioperative antibiotics were administered.  The patient was placed in dorsal lithotomy.  Patient was prepped and draped in a standard sterile fashion and a timeout was performed.  A 21 French rigid cystoscope was advanced into the urethra and into the bladder.  Complete cystoscopy was performed with findings noted above.  The right ureter was cannulated with an open-ended ureteral catheter and a retrograde pyelogram was performed with no abnormal findings.  Same was performed on the left again with no abnormal findings.  I drained the bladder and withdrew the scope.  I advanced a 17 French resectoscope with a visual obturator in  place into the urethra and into the bladder.  Complete cystoscopy was performed with the findings noted above.  I exchanged for the bipolar working element and resected the tumor of interest followed by fulguration of the resection bed.  There was no evidence of any active bleeding and no evidence of perforation.  Resection was well away from the ureteral orifices.  I withdrew the scope and placed an 28 French Foley catheter.  This concluded the operation.  Patient tolerated the procedure well and stable postoperatively.  In the PACU, I instilled gemcitabine  into the bladder where it remained for proxy 1 hour prior to proper disposal.  Plan: Follow-up in 1 week for pathology review

## 2023-11-10 NOTE — OR Nursing (Signed)
 Patient to use [employer] for taxi services. Contact number [phone]

## 2023-11-10 NOTE — H&P (Signed)
 CC/HPI: CC: Bladder cancer  HPI:  01/24/2019  73 year old male presents with a primary complaint of urgency to urinate with low volume output. He also complains about mildly weak stream, nocturia 3 in difficulty postponing urination. AUA symptom score is 15. He denies a history of urinary tract infections except for once in the 1970s. He denies current hematuria or dysuria. Incidentally, he notes a 2 year history of intermittent gross hematuria. Most recently, the gross hematuria symptoms have increased and he had 3 episodes of gross hematuria last week. He has never had this worked up. He is asymptomatic in this regard. He started Flomax  about 2 weeks ago and has not noticed much of a difference.   02/01/2019  Patient underwent a CT IVP. This revealed multiple filling defects in his bladder consistent with likely tumors. Kidney and ureter were normal. PSA at the last visit was normal.   02/11/19: s/p 1st TURBT on 02/06/19. 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 He tolerated well and returns today for pathology review and TOV. Urine has been clear. He has no complaints today. In the operating room, he was found have a large amount of tumor. It was not completely resectable with one surgery.   02/26/2019  Patient is status post his second TURBT on 02/20/2019. 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 He has been doing well with the catheter. Urine is cleared up. He greatly desires to avoid cystectomy. He does understand that he has a large amount of tumor involvement and after 2 resections, I was not able to remove all the tumor. However, given no muscle involvement. He desires for me to try full resection endoscopically. 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   03/19/2019  Patient status post his third TURBT. He tolerated this well. Hematuria is resolving. 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 Muscle  was present in the specimen. All tumor was resected. After this third TURBT.   08/23/2019  Patient completed BCG. He presents for cystoscopy. He denies interval gross hematuria.   10/02/2019  Patient is status post TURBT. This revealed superficial low-grade urothelial cell carcinoma. No evidence of high-grade cancer. He has had no further gross hematuria. No pain.   02/27/2020  Patient completed repeat induction BCG. He presents for cystoscopy. He does also complain about significant urinary frequency and urgency as well as nocturia 2-4 times a night. He is on tamsulosin .   05/27/2020  Patient presents for surveillance cystoscopy. He completed his maintenance BCG. His urinary urgency and frequency have improved. Nocturia is intermittent. It depends on the amount that he drinks. He is not taking Myrbetriq. It did not really help. He underwent a CT of the abdomen and pelvis, CT IVP. This revealed no evidence of upper tract malignancy.   07/02/2020  Patient underwent TURBT --small with instillation of gemcitabine . Pathology revealed early noninvasive low-grade urothelial cell carcinoma. He has no complaints today.   11/27/2020:  S/p TURBT 11/20/2020 with benign urothelium and submucosa with dystrophic calcification and reactive giant cells. NED.   06/25/2021  Patient had repeat upper tract imaging that revealed no evidence of genitourinary malignancy. He presents for surveillance cystoscopy.   10/14/2021  Patient presents for surveillance cystoscopy after undergoing a cystoscopy with office fulguration a couple months ago.   12/31/2021  Patient presents today for destruction of bladder tumor 0.5 to 2 cm.   04/01/2022  Patient status post destruction of bladder tumors in the office. Presents for surveillance cystoscopy. Completed  maintenance BCG x3.   08/04/2022  Patient completed repeat maintenance BCG x3. Presents for surveillance cystoscopy. Does complain about some urinary complaints  including frequency, urgency, weak stream. Has taken tamsulosin  in the past but did not think that helped very much.   11/30/2022  Patient status post TURBT with instillation of gemcitabine . 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 Patient doing well.   06/01/2023  Patient completed intravesical gemcitabine . He presents for surveillance cystoscopy.   07/12/2023  Patient again underwent TURBT with instillation of gemcitabine  and bilateral retrograde pyelogram which were negative. Bladder tumor again showed noninvasive low-grade papillary urothelial cell carcinoma. He has now had recurrence despite BCG and intravesical gemcitabine  x 6 treatments.   10/24/2023  In the interval, the patient was evaluated by oncology and ultimately decided to continue cystoscopy surveillance and holding off on Keytruda treatment given that his recurrence has been low-grade. He presents for surveillance cystoscopy.     ALLERGIES: No Allergies    MEDICATIONS: Calcium  Carbonate  Clobetasol Propionate PRN  Diphenhydramine  Hcl 25 mg capsule capsule PRN  Famotidine  20 mg tablet  Mometasone Furoate PRN  Oxycodone -Acetaminophen  PRN  Pimecrolimus 1 % cream PRN     GU PSH: Bladder Instill AntiCA Agent - 11/21/2022, 05/11/2022, 05/03/2022, 04/26/2022, 2023, 2023, 2023, 2022, 2021, 2021, 2021, 2021, 2021, 2021, 2021, 2020, 2020, 2020, 2020, 2020, 2020, 2020, 2020, 2020, 2020 Cystoscopy - 06/01/2023, 03/02/2023, 11/03/2022, 08/04/2022, 2022, 2022, 2022, 2021, 2021, 2021, 2020, 2020 Cystoscopy TURBT <2 cm - 2023, 2022, 2022, 2021 Cystoscopy TURBT >5 cm - 2020, 2020, 2020 Cystoscopy TURBT 2-5 cm - 11/21/2022, 2020 Locm 300-399Mg /Ml Iodine,1Ml - 2022, 2021, 2020       PSH Notes: spinal fusion 09-05-2021   NON-GU PSH: Visit Complexity (formerly GPC1X) - 07/12/2023     GU PMH: Bladder Cancer overlapping sites - 07/12/2023, - 06/01/2023, - 03/02/2023, - 11/30/2022, - 11/03/2022, - 08/04/2022, -  05/11/2022, - 05/03/2022, - 04/26/2022, - 2023, - 2023, - 2023, - 2023, - 2023, - 2022, - 2022, - 2022, - 2022, - 2022, - 2022, - 2021, HGT1 bladder cancer Catheter removed today We discussed proceeding with repeat TURBT for confirmatory staging and residual tumor resection. , - 2020 BPH w/LUTS - 03/02/2023, - 08/04/2022, - 2022, - 2021, - 2020 Nocturia - 03/02/2023, - 08/04/2022, - 2022 (Stable), - 2021, - 2020 Urinary Frequency - 08/04/2022, - 2021 Urinary Urgency - 08/04/2022, - 2022, - 2020 Weak Urinary Stream - 08/04/2022, - 2020 Bladder tumor/neoplasm - 2020 Encounter for Prostate Cancer screening - 2020 Gross hematuria - 2020    NON-GU PMH: No Non-GU PMH    FAMILY HISTORY: No Family History    SOCIAL HISTORY: Marital Status: Married    REVIEW OF SYSTEMS:    GU Review Male:   Patient denies frequent urination, hard to postpone urination, burning/ pain with urination, get up at night to urinate, leakage of urine, stream starts and stops, trouble starting your stream, have to strain to urinate , erection problems, and penile pain.  Gastrointestinal (Upper):   Patient denies nausea, vomiting, and indigestion/ heartburn.  Gastrointestinal (Lower):   Patient denies diarrhea and constipation.  Constitutional:   Patient denies fever, night sweats, weight loss, and fatigue.  Skin:   Patient denies skin rash/ lesion and itching.  Eyes:   Patient denies blurred vision and double vision.  Ears/ Nose/ Throat:   Patient denies sore throat and sinus problems.  Hematologic/Lymphatic:   Patient denies swollen glands and easy bruising.  Cardiovascular:  Patient denies chest pains and leg swelling.  Respiratory:   Patient denies cough and shortness of breath.  Endocrine:   Patient denies excessive thirst.  Musculoskeletal:   Patient denies back pain and joint pain.  Neurological:   Patient denies headaches and dizziness.  Psychologic:   Patient denies depression and anxiety.   VITAL SIGNS: None    MULTI-SYSTEM PHYSICAL EXAMINATION:    Constitutional: Well-nourished. No physical deformities. Normally developed. Good grooming.  Gastrointestinal: No mass, no tenderness, no rigidity, non obese abdomen.  Eyes: Normal conjunctivae. Normal eyelids.  Musculoskeletal: Normal gait and station of head and neck.     Complexity of Data:   01/24/19  PSA  Total PSA 2.93 ng/mL    PROCEDURES:         Flexible Cystoscopy - 52000  Risks, benefits, and some of the potential complications of the procedure were discussed at length with the patient including infection, bleeding, voiding discomfort, urinary retention, fever, chills, sepsis, and others. All questions were answered. Informed consent was obtained. Antibiotic prophylaxis was given. Sterile technique and intraurethral analgesia were used.  Meatus:  Normal size. Normal location. Normal condition.  Urethra:  No strictures.  External Sphincter:  Normal.  Verumontanum:  Normal.  Prostate:  Borderline obstructing. Mild hyperplasia.  Bladder Neck:  Non-obstructing.  Ureteral Orifices:  Normal location. Normal size. Normal shape.   Bladder:  No trabeculation. Multiple prior TUR scars. Just inside the bladder beyond the bladder neck at the midline he has an approximately 1 cm bladder tumor appears superficial. He had overlying fibrinous material over a prior TUR site on the anterior bladder wall that did not look like a recurrence.      The lower urinary tract was carefully examined. The procedure was well-tolerated and without complications. Antibiotic instructions were given. Instructions were given to call the office immediately for bloody urine, difficulty urinating, urinary retention, painful or frequent urination, fever, chills, nausea, vomiting or other illness. The patient stated that he understood these instructions and would comply with them.         Urinalysis Dipstick Dipstick Cont'd  Color: Yellow Bilirubin: Neg mg/dL  Appearance:  Clear Ketones: Neg mg/dL  Specific Gravity: 8.984 Blood: Neg ery/uL  pH: 7.0 Protein: Trace mg/dL  Glucose: Neg mg/dL Urobilinogen: 0.2 mg/dL    Nitrites: Neg    Leukocyte Esterase: Neg leu/uL    ASSESSMENT:      ICD-10 Details  1 GU:   Bladder Cancer overlapping sites - C67.8 Chronic, Stable   PLAN:           Document Letter(s):  Created for Patient: Clinical Summary         Notes:   Plan for cystoscopy with bilateral retrograde pyelogram, transurethral resection of bladder tumor, instillation of gemcitabine . Risk and benefits discussed.   CC: Dr. Douglass   Signed by Sherwood Edison, III, M.D. on 10/24/23 at 11:46 AM (EST

## 2023-11-10 NOTE — Transfer of Care (Signed)
 Immediate Anesthesia Transfer of Care Note  Patient: Jeremy Day  Procedure(s) Performed: TRANSURETHRAL RESECTION OF BLADDER TUMOR (TURBT) with GEMCITABINE  AND BILATERAL RETROGRADE PYELOGRAM (Bilateral)  Patient Location: PACU  Anesthesia Type:General  Level of Consciousness: sedated  Airway & Oxygen Therapy: Patient Spontanous Breathing  Post-op Assessment: Report given to RN  Post vital signs: Reviewed and stable  Last Vitals:  Vitals Value Taken Time  BP 117/77 11/10/23 1352  Temp    Pulse 79 11/10/23 1359  Resp 14 11/10/23 1359  SpO2 100 % 11/10/23 1359  Vitals shown include unfiled device data.  Last Pain:  Vitals:   11/10/23 1144  TempSrc: Oral  PainSc:          Complications: No notable events documented.

## 2023-11-10 NOTE — Anesthesia Postprocedure Evaluation (Signed)
 Anesthesia Post Note  Patient: Jeremy Day  Procedure(s) Performed: TRANSURETHRAL RESECTION OF BLADDER TUMOR (TURBT) with GEMCITABINE  AND BILATERAL RETROGRADE PYELOGRAM (Bilateral)     Patient location during evaluation: PACU Anesthesia Type: General Level of consciousness: awake and alert Pain management: pain level controlled Vital Signs Assessment: post-procedure vital signs reviewed and stable Respiratory status: spontaneous breathing, nonlabored ventilation, respiratory function stable and patient connected to nasal cannula oxygen Cardiovascular status: blood pressure returned to baseline and stable Postop Assessment: no apparent nausea or vomiting Anesthetic complications: no   No notable events documented.  Last Vitals:  Vitals:   11/10/23 1515 11/10/23 1542  BP: (!) 126/92 (!) 149/97  Pulse: 72 (!) 58  Resp:    Temp:  (!) 36.4 C  SpO2: 97% 100%    Last Pain:  Vitals:   11/10/23 1542  TempSrc: Oral  PainSc:                  Chamari Cutbirth

## 2023-11-10 NOTE — Progress Notes (Signed)
 Patient presented for surgery today, with the impression that he could take a cab home after surgery because when he had surgery back in 06/2023 that is what they did for him.  I explained to the patient that it is not safe for him to take a cab home due to anesthesia and due to the effects of the anesthesia medications and narcotics it causes impaired judgement. Patient stated that his sister is at home and that she is able to care for him, but she just can not drive.  Explained to the patient that if he has a medical emergency, he would need to call 911.  Notified anesthesia Dr Mallory of situation and explained that I would need to discuss with Dr Carolee.  Called Dr Carolee to discuss.  Dr Carolee stated that the tumor is small and that it is not urgent/emergent that the patient has this surgery today.    Spoke with Dr Mallory, who stated that the patient is ok to go home by cab since his sister is at home, Left message for Dr Carolee about this  11:10 AM Reached out to the [facility] to get help with getting a cab voucher,  Due to the distance from the patients home to the hospital, approval will be needed to get a voucher.    11:41 AM Patient told his pre-op RN that he is planning to pay for the cab himself. 11:43 AM Dr. Mallory made aware that the patient is planning to pay for his own cab  Spoke with [employer] who stated that the patient can pay for his cab.  Patient made aware

## 2023-11-10 NOTE — Discharge Instructions (Signed)

## 2023-11-13 LAB — SURGICAL PATHOLOGY

## 2024-02-15 DIAGNOSIS — C678 Malignant neoplasm of overlapping sites of bladder: Secondary | ICD-10-CM | POA: Diagnosis not present

## 2024-02-19 ENCOUNTER — Other Ambulatory Visit: Payer: Self-pay | Admitting: Urology

## 2024-02-26 DIAGNOSIS — Z85828 Personal history of other malignant neoplasm of skin: Secondary | ICD-10-CM | POA: Diagnosis not present

## 2024-02-26 DIAGNOSIS — D225 Melanocytic nevi of trunk: Secondary | ICD-10-CM | POA: Diagnosis not present

## 2024-02-26 DIAGNOSIS — L578 Other skin changes due to chronic exposure to nonionizing radiation: Secondary | ICD-10-CM | POA: Diagnosis not present

## 2024-02-26 DIAGNOSIS — L814 Other melanin hyperpigmentation: Secondary | ICD-10-CM | POA: Diagnosis not present

## 2024-02-26 DIAGNOSIS — L821 Other seborrheic keratosis: Secondary | ICD-10-CM | POA: Diagnosis not present

## 2024-02-26 DIAGNOSIS — D485 Neoplasm of uncertain behavior of skin: Secondary | ICD-10-CM | POA: Diagnosis not present

## 2024-02-26 DIAGNOSIS — C4442 Squamous cell carcinoma of skin of scalp and neck: Secondary | ICD-10-CM | POA: Diagnosis not present

## 2024-02-28 DIAGNOSIS — C4442 Squamous cell carcinoma of skin of scalp and neck: Secondary | ICD-10-CM | POA: Diagnosis not present

## 2024-02-29 DIAGNOSIS — F331 Major depressive disorder, recurrent, moderate: Secondary | ICD-10-CM | POA: Diagnosis not present

## 2024-02-29 DIAGNOSIS — R7303 Prediabetes: Secondary | ICD-10-CM | POA: Diagnosis not present

## 2024-02-29 DIAGNOSIS — E782 Mixed hyperlipidemia: Secondary | ICD-10-CM | POA: Diagnosis not present

## 2024-02-29 DIAGNOSIS — Z0000 Encounter for general adult medical examination without abnormal findings: Secondary | ICD-10-CM | POA: Diagnosis not present

## 2024-02-29 DIAGNOSIS — K219 Gastro-esophageal reflux disease without esophagitis: Secondary | ICD-10-CM | POA: Diagnosis not present

## 2024-02-29 DIAGNOSIS — Z125 Encounter for screening for malignant neoplasm of prostate: Secondary | ICD-10-CM | POA: Diagnosis not present

## 2024-02-29 DIAGNOSIS — R635 Abnormal weight gain: Secondary | ICD-10-CM | POA: Diagnosis not present

## 2024-02-29 DIAGNOSIS — M48062 Spinal stenosis, lumbar region with neurogenic claudication: Secondary | ICD-10-CM | POA: Diagnosis not present

## 2024-02-29 DIAGNOSIS — C679 Malignant neoplasm of bladder, unspecified: Secondary | ICD-10-CM | POA: Diagnosis not present

## 2024-02-29 DIAGNOSIS — R053 Chronic cough: Secondary | ICD-10-CM | POA: Diagnosis not present

## 2024-02-29 DIAGNOSIS — Z23 Encounter for immunization: Secondary | ICD-10-CM | POA: Diagnosis not present

## 2024-02-29 DIAGNOSIS — Z1331 Encounter for screening for depression: Secondary | ICD-10-CM | POA: Diagnosis not present

## 2024-02-29 DIAGNOSIS — C678 Malignant neoplasm of overlapping sites of bladder: Secondary | ICD-10-CM | POA: Diagnosis not present

## 2024-03-07 NOTE — Progress Notes (Signed)
 Anesthesia Review:  Jeremy Day  Cardiologist : Dorothye Gathers  LOV 5/28/24Renelda Carry DickNP   PPM/ ICD: Device Orders: Rep Notified:  Chest x-ra y : EKG : 03/13/24  Echo : 11/04/22  Stress test: Cardiac Cath :   Activity level: can do a flight of stairs wtihout difficulty  Sleep Study/ CPAP : none  Fasting Blood Sugar :      / Checks Blood Sugar -- times a day:    Blood Thinner/ Instructions /Last Dose: ASA / Instructions/ Last Dose :    11/10/23- TURBT - last one    [facility]

## 2024-03-11 NOTE — Patient Instructions (Signed)
 SURGICAL WAITING ROOM VISITATION  Patients having surgery or a procedure may have no more than 2 support people in the waiting area - these visitors may rotate.    Children under the age of 42 must have an adult with them who is not the patient.  Due to an increase in RSV and influenza rates and associated hospitalizations, children ages 68 and under may not visit patients in [facility] hospitals.  Visitors with respiratory illnesses are discouraged from visiting and should remain at home.  If the patient needs to stay at the hospital during part of their recovery, the visitor guidelines for inpatient rooms apply. Pre-op nurse will coordinate an appropriate time for 1 support person to accompany patient in pre-op.  This support person may not rotate.    Please refer to the [facility] website for the visitor guidelines for Inpatients (after your surgery is over and you are in a regular room).       Your procedure is scheduled on:  03/25/2024    Report to [facility] Main Entrance    Report to admitting at   0730AM   Call this number if you have problems the morning of surgery [phone]   Do not eat food  or drink liquids :After Midnight.                             If you have questions, please contact your surgeon's office.      Oral Hygiene is also important to reduce your risk of infection.                                    Remember - BRUSH YOUR TEETH THE MORNING OF SURGERY WITH YOUR REGULAR TOOTHPASTE  DENTURES WILL BE REMOVED PRIOR TO SURGERY PLEASE DO NOT APPLY "Poly grip" OR ADHESIVES!!!   Do NOT smoke after Midnight   Stop all vitamins and herbal supplements 7 days before surgery.   Take these medicines the morning of surgery with A SIP OF WATER :  pepcid , toprol  , cymbalta, flomax    DO NOT TAKE ANY ORAL DIABETIC MEDICATIONS DAY OF YOUR SURGERY  Bring CPAP mask and tubing day of surgery.                              You may not have any metal on  your body including hair pins, jewelry, and body piercing             Do not wear make-up, lotions, powders, perfumes/cologne, or deodorant  Do not wear nail polish including gel and S&S, artificial/acrylic nails, or any other type of covering on natural nails including finger and toenails. If you have artificial nails, gel coating, etc. that needs to be removed by a nail salon please have this removed prior to surgery or surgery may need to be canceled/ delayed if the surgeon/ anesthesia feels like they are unable to be safely monitored.   Do not shave  48 hours prior to surgery.               Men may shave face and neck.   Do not bring valuables to the hospital. [facility] IS NOT             RESPONSIBLE   FOR VALUABLES.   Contacts, glasses, dentures  or bridgework may not be worn into surgery.   Bring small overnight bag day of surgery.   DO NOT BRING YOUR HOME MEDICATIONS TO THE HOSPITAL. PHARMACY WILL DISPENSE MEDICATIONS LISTED ON YOUR MEDICATION LIST TO YOU DURING YOUR ADMISSION IN THE HOSPITAL!    Patients discharged on the day of surgery will not be allowed to drive home.  Someone NEEDS to stay with you for the first 24 hours after anesthesia.   Special Instructions: Bring a copy of your healthcare power of attorney and living will documents the day of surgery if you haven't scanned them before.              Please read over the following fact sheets you were given: IF YOU HAVE QUESTIONS ABOUT YOUR PRE-OP INSTRUCTIONS PLEASE CALL [phone]   If you received a COVID test during your pre-op visit  it is requested that you wear a mask when out in public, stay away from anyone that may not be feeling well and notify your surgeon if you develop symptoms. If you test positive for Covid or have been in contact with anyone that has tested positive in the last 10 days please notify you surgeon.    [place] - Preparing for Surgery Before surgery, you can play an important role.   Because skin is not sterile, your skin needs to be as free of germs as possible.  You can reduce the number of germs on your skin by washing with CHG (chlorahexidine gluconate) soap before surgery.  CHG is an antiseptic cleaner which kills germs and bonds with the skin to continue killing germs even after washing. Please DO NOT use if you have an allergy to CHG or antibacterial soaps.  If your skin becomes reddened/irritated stop using the CHG and inform your nurse when you arrive at Short Stay. Do not shave (including legs and underarms) for at least 48 hours prior to the first CHG shower.  You may shave your face/neck. Please follow these instructions carefully:  1.  Shower with CHG Soap the night before surgery and the  morning of Surgery.  2.  If you choose to wash your hair, wash your hair first as usual with your  normal  shampoo.  3.  After you shampoo, rinse your hair and body thoroughly to remove the  shampoo.                           4.  Use CHG as you would any other liquid soap.  You can apply chg directly  to the skin and wash                       Gently with a scrungie or clean washcloth.  5.  Apply the CHG Soap to your body ONLY FROM THE NECK DOWN.   Do not use on face/ open                           Wound or open sores. Avoid contact with eyes, ears mouth and genitals (private parts).                       Wash face,  Genitals (private parts) with your normal soap.             6.  Wash thoroughly, paying special attention to the area where your surgery  will be performed.  7.  Thoroughly rinse your body with warm water  from the neck down.  8.  DO NOT shower/wash with your normal soap after using and rinsing off  the CHG Soap.                9.  Pat yourself dry with a clean towel.            10.  Wear clean pajamas.            11.  Place clean sheets on your bed the night of your first shower and do not  sleep with pets. Day of Surgery : Do not apply any lotions/deodorants the  morning of surgery.  Please wear clean clothes to the hospital/surgery center.  FAILURE TO FOLLOW THESE INSTRUCTIONS MAY RESULT IN THE CANCELLATION OF YOUR SURGERY PATIENT SIGNATURE_________________________________  NURSE SIGNATURE__________________________________  ________________________________________________________________________

## 2024-03-13 ENCOUNTER — Encounter (HOSPITAL_COMMUNITY)
Admission: RE | Admit: 2024-03-13 | Discharge: 2024-03-13 | Disposition: A | Discharge status: Home / Self Care | Source: Ambulatory Visit | Attending: Urology | Admitting: Urology

## 2024-03-13 ENCOUNTER — Encounter (HOSPITAL_COMMUNITY): Payer: Self-pay

## 2024-03-13 ENCOUNTER — Other Ambulatory Visit: Payer: Self-pay

## 2024-03-13 VITALS — BP 118/85 | HR 60 | Temp 97.8°F | Resp 16 | Ht 72.0 in | Wt 204.0 lb

## 2024-03-13 DIAGNOSIS — Z01818 Encounter for other preprocedural examination: Secondary | ICD-10-CM | POA: Insufficient documentation

## 2024-03-13 LAB — BASIC METABOLIC PANEL WITH GFR
Anion gap: 7 (ref 5–15)
BUN: 11 mg/dL (ref 8–23)
CO2: 28 mmol/L (ref 22–32)
Calcium: 9 mg/dL (ref 8.9–10.3)
Chloride: 100 mmol/L (ref 98–111)
Creatinine, Ser: 0.91 mg/dL (ref 0.61–1.24)
GFR, Estimated: >60 mL/min (ref >60)
Glucose, Bld: 93 mg/dL (ref 70–99)
Potassium: 3.9 mmol/L (ref 3.5–5.1)
Sodium: 135 mmol/L (ref 135–145)

## 2024-03-13 LAB — CBC
HCT: 48.5 % (ref 39.0–52.0)
Hemoglobin: 15.1 g/dL (ref 13.0–17.0)
MCH: 30 pg (ref 26.0–34.0)
MCHC: 31.1 g/dL (ref 30.0–36.0)
MCV: 96.2 fL (ref 80.0–100.0)
Platelets: 206 10*3/uL (ref 150–400)
RBC: 5.04 MIL/uL (ref 4.22–5.81)
RDW: 15.5 % (ref 11.5–15.5)
WBC: 7.6 10*3/uL (ref 4.0–10.5)
nRBC: 0 % (ref 0.0–0.2)

## 2024-03-21 ENCOUNTER — Other Ambulatory Visit: Payer: Self-pay | Admitting: Family Medicine

## 2024-03-21 ENCOUNTER — Ambulatory Visit
Admission: RE | Admit: 2024-03-21 | Discharge: 2024-03-21 | Disposition: A | Discharge status: Home / Self Care | Source: Ambulatory Visit | Attending: Family Medicine | Admitting: Family Medicine

## 2024-03-21 DIAGNOSIS — R053 Chronic cough: Secondary | ICD-10-CM

## 2024-03-21 DIAGNOSIS — R059 Cough, unspecified: Secondary | ICD-10-CM | POA: Diagnosis not present

## 2024-03-25 ENCOUNTER — Encounter (HOSPITAL_COMMUNITY): Payer: Self-pay | Admitting: Urology

## 2024-03-25 ENCOUNTER — Ambulatory Visit (HOSPITAL_BASED_OUTPATIENT_CLINIC_OR_DEPARTMENT_OTHER): Admitting: Anesthesiology

## 2024-03-25 ENCOUNTER — Ambulatory Visit (HOSPITAL_COMMUNITY)
Admission: RE | Admit: 2024-03-25 | Discharge: 2024-03-25 | Disposition: A | Discharge status: Home / Self Care | Source: Ambulatory Visit | Attending: Urology | Admitting: Urology

## 2024-03-25 ENCOUNTER — Encounter (HOSPITAL_COMMUNITY)
Admission: RE | Disposition: A | Discharge status: Home / Self Care | Payer: Self-pay | Source: Ambulatory Visit | Attending: Urology

## 2024-03-25 ENCOUNTER — Ambulatory Visit (HOSPITAL_COMMUNITY): Payer: Self-pay | Admitting: Physician Assistant

## 2024-03-25 ENCOUNTER — Ambulatory Visit (HOSPITAL_COMMUNITY)

## 2024-03-25 DIAGNOSIS — I10 Essential (primary) hypertension: Secondary | ICD-10-CM

## 2024-03-25 DIAGNOSIS — F32A Depression, unspecified: Secondary | ICD-10-CM | POA: Diagnosis not present

## 2024-03-25 DIAGNOSIS — C679 Malignant neoplasm of bladder, unspecified: Secondary | ICD-10-CM | POA: Diagnosis not present

## 2024-03-25 DIAGNOSIS — C672 Malignant neoplasm of lateral wall of bladder: Secondary | ICD-10-CM | POA: Diagnosis present

## 2024-03-25 DIAGNOSIS — D494 Neoplasm of unspecified behavior of bladder: Secondary | ICD-10-CM | POA: Diagnosis not present

## 2024-03-25 DIAGNOSIS — D090 Carcinoma in situ of bladder: Secondary | ICD-10-CM | POA: Diagnosis not present

## 2024-03-25 DIAGNOSIS — Z01818 Encounter for other preprocedural examination: Secondary | ICD-10-CM

## 2024-03-25 SURGERY — TURBT, WITH CHEMOTHERAPEUTIC AGENT INSTILLATION INTO BLADDER
Anesthesia: General

## 2024-03-25 MED ORDER — LIDOCAINE HCL (CARDIAC) PF 100 MG/5ML IV SOSY
PREFILLED_SYRINGE | INTRAVENOUS | Status: DC | PRN
  Administered 2024-03-25: 60 mg via INTRAVENOUS

## 2024-03-25 MED ORDER — PROPOFOL 10 MG/ML IV BOLUS
INTRAVENOUS | Status: DC | PRN
  Administered 2024-03-25: 150 mg via INTRAVENOUS

## 2024-03-25 MED ORDER — ONDANSETRON HCL 4 MG/2ML IJ SOLN
4.0000 mg | Freq: Four times a day (QID) | INTRAMUSCULAR | Status: AC | PRN
  Administered 2024-03-25: 4 mg via INTRAVENOUS

## 2024-03-25 MED ORDER — PHENYLEPHRINE 80 MCG/ML (10ML) SYRINGE FOR IV PUSH (FOR BLOOD PRESSURE SUPPORT)
PREFILLED_SYRINGE | INTRAVENOUS | Status: AC
  Filled 2024-03-25: qty 10

## 2024-03-25 MED ORDER — FENTANYL CITRATE (PF) 100 MCG/2ML IJ SOLN
INTRAMUSCULAR | Status: AC
  Filled 2024-03-25: qty 2

## 2024-03-25 MED ORDER — PHENYLEPHRINE 80 MCG/ML (10ML) SYRINGE FOR IV PUSH (FOR BLOOD PRESSURE SUPPORT)
PREFILLED_SYRINGE | INTRAVENOUS | Status: DC | PRN
  Administered 2024-03-25: 80 ug via INTRAVENOUS

## 2024-03-25 MED ORDER — LACTATED RINGERS IV SOLN: INTRAVENOUS | Status: DC

## 2024-03-25 MED ORDER — ONDANSETRON HCL 4 MG/2ML IJ SOLN
INTRAMUSCULAR | Status: AC
  Filled 2024-03-25: qty 2

## 2024-03-25 MED ORDER — PROPOFOL 10 MG/ML IV BOLUS
INTRAVENOUS | Status: AC
  Filled 2024-03-25: qty 20

## 2024-03-25 MED ORDER — HYDROCODONE-ACETAMINOPHEN 5-325 MG PO TABS: 1.0000 | ORAL_TABLET | Freq: Four times a day (QID) | ORAL | 0 refills | Status: AC | PRN

## 2024-03-25 MED ORDER — DEXAMETHASONE SODIUM PHOSPHATE 10 MG/ML IJ SOLN
INTRAMUSCULAR | Status: AC
  Filled 2024-03-25: qty 1

## 2024-03-25 MED ORDER — SODIUM CHLORIDE 0.9 % IR SOLN
Status: DC | PRN
Start: 2024-03-25 — End: 2024-03-25
  Administered 2024-03-25: 1000 mL

## 2024-03-25 MED ORDER — OXYCODONE HCL 5 MG/5ML PO SOLN: 5.0000 mg | Freq: Once | ORAL | Status: DC | PRN

## 2024-03-25 MED ORDER — GEMCITABINE CHEMO FOR BLADDER INSTILLATION 2000 MG
2000.0000 mg | Freq: Once | INTRAVENOUS | Status: AC
  Administered 2024-03-25: 2000 mg via INTRAVESICAL
  Filled 2024-03-25: qty 2000

## 2024-03-25 MED ORDER — OXYCODONE HCL 5 MG PO TABS: 5.0000 mg | ORAL_TABLET | Freq: Once | ORAL | Status: DC | PRN

## 2024-03-25 MED ORDER — FENTANYL CITRATE (PF) 100 MCG/2ML IJ SOLN
INTRAMUSCULAR | Status: DC | PRN
Start: 2024-03-25 — End: 2024-03-25
  Administered 2024-03-25 (×2): 50 ug via INTRAVENOUS

## 2024-03-25 MED ORDER — CEFAZOLIN SODIUM-DEXTROSE 2-4 GM/100ML-% IV SOLN
2.0000 g | INTRAVENOUS | Status: AC
  Administered 2024-03-25: 2 g via INTRAVENOUS
  Filled 2024-03-25: qty 100

## 2024-03-25 MED ORDER — ORAL CARE MOUTH RINSE: 15.0000 mL | Freq: Once | OROMUCOSAL | Status: AC

## 2024-03-25 MED ORDER — CHLORHEXIDINE GLUCONATE 0.12 % MT SOLN
15.0000 mL | Freq: Once | OROMUCOSAL | Status: AC
  Administered 2024-03-25: 15 mL via OROMUCOSAL

## 2024-03-25 MED ORDER — FENTANYL CITRATE PF 50 MCG/ML IJ SOSY: 25.0000 ug | PREFILLED_SYRINGE | INTRAMUSCULAR | Status: DC | PRN

## 2024-03-25 MED ORDER — IOHEXOL 300 MG/ML  SOLN
INTRAMUSCULAR | Status: DC | PRN
  Administered 2024-03-25: 15 mL
  Administered 2024-03-25: 50 mL

## 2024-03-25 MED ORDER — LIDOCAINE HCL (PF) 2 % IJ SOLN
INTRAMUSCULAR | Status: AC
  Filled 2024-03-25: qty 5

## 2024-03-25 SURGICAL SUPPLY — 17 items
BAG URINE DRAIN 2000ML AR STRL (UROLOGICAL SUPPLIES) IMPLANT
BAG URO CATCHER STRL LF (MISCELLANEOUS) ×2 IMPLANT
CATH FOLEY 2WAY SLVR 5CC 18FR (CATHETERS) IMPLANT
CATH URETL OPEN END 6FR 70 (CATHETERS) IMPLANT
DRAPE FOOT SWITCH (DRAPES) ×2 IMPLANT
ELECT REM PT RETURN 15FT ADLT (MISCELLANEOUS) ×2 IMPLANT
GLOVE BIO SURGEON STRL SZ7.5 (GLOVE) ×2 IMPLANT
GOWN STRL REUS W/ TWL XL LVL3 (GOWN DISPOSABLE) ×2 IMPLANT
KIT TURNOVER KIT A (KITS) IMPLANT
LOOP CUT BIPOLAR 24F LRG (ELECTROSURGICAL) IMPLANT
MANIFOLD NEPTUNE II (INSTRUMENTS) ×2 IMPLANT
PACK CYSTO (CUSTOM PROCEDURE TRAY) ×2 IMPLANT
PLUG CATH AND CAP STRL 200 (CATHETERS) IMPLANT
SYR 10ML LL (SYRINGE) IMPLANT
SYRINGE TOOMEY IRRIG 70ML (MISCELLANEOUS) IMPLANT
TUBING CONNECTING 10 (TUBING) ×2 IMPLANT
TUBING UROLOGY SET (TUBING) ×2 IMPLANT

## 2024-03-25 NOTE — Discharge Instructions (Addendum)

## 2024-03-25 NOTE — Op Note (Signed)
 Operative Note  Preoperative diagnosis:  1.  Bladder tumor  Postoperative diagnosis: 1.  Bladder tumor--medium  Procedure(s): 1.  Cystoscopy with bilateral retrograde pyelogram 2.  Transurethral resection of bladder tumor--medium 3.  Intravesical instillation of gemcitabine   Surgeon: Leila Punt, MD  Assistants: None  Anesthesia: General  Complications: None immediate  EBL: Normal  Specimens: 1.  Bladder tumor  Drains/Catheters: 1.  18 French Foley catheter  Intraoperative findings: 1.  Normal anterior urethra 2.  Borderline obstructing prostate 3.  Bladder mucosa with an approxi-1 cm papillary bladder tumor on the right lateral wall.  He had a separate area of slightly raised papillary mucosa measuring 2 cm close to the bladder neck on the right.  All tumor resected and/or fulgurated.  4.  Left retrograde pyelogram without any filling defect or hydronephrosis  5.  Right retrograde pyelogram without any filling defect or hydronephrosis  Indication: 73 year old male with history of bladder cancer found to have a recurrence presents for the previously mentioned operation.  Description of procedure:  The patient was identified and consent was obtained.  The patient was taken to the operating room and placed in the supine position.  The patient was placed under general anesthesia.  Perioperative antibiotics were administered.  The patient was placed in dorsal lithotomy.  Patient was prepped and draped in a standard sterile fashion and a timeout was performed.  A 26 French resectoscope with a visual obturator in place was advanced into the urethra and into the bladder.  Complete cystoscopy was performed with findings noted above.  I exchanged for a bipolar working element and proceeded to resect the tumors of interest and fulgurated the resection bed.  Specimen was collected.  I inspected the bladder mucosa and no active bleeding was noted.  There was no perforation.  I withdrew  the scope and advanced a 21 French rigid cystoscope into the bladder.  Intubated the left ureteral orifice with an open-ended ureteral catheter and shot a retrograde pyelogram with no abnormal findings.  Same was performed on the right again with no abnormal findings.  I drained the bladder and withdrew the scope.  Patient tolerated the procedure well was stable postoperatively.  In the PACU, I instilled gemcitabine  into the bladder where it remained for approximately 1 hour prior to proper disposal.  Plan: Follow-up in 1 week for pathology review

## 2024-03-25 NOTE — Anesthesia Procedure Notes (Signed)
 Procedure Name: LMA Insertion Date/Time: 03/25/2024 9:50 AM  Performed by: Ladajah Soltys C, CRNAPre-anesthesia Checklist: Patient identified, Emergency Drugs available, Suction available and Patient being monitored Patient Re-evaluated:Patient Re-evaluated prior to induction Oxygen Delivery Method: Circle System Utilized Preoxygenation: Pre-oxygenation with 100% oxygen Induction Type: IV induction Ventilation: Mask ventilation without difficulty LMA: LMA inserted LMA Size: 4.0 Number of attempts: 1 Airway Equipment and Method: Bite block Placement Confirmation: positive ETCO2 Tube secured with: Tape Dental Injury: Teeth and Oropharynx as per pre-operative assessment

## 2024-03-25 NOTE — H&P (Signed)
 CC/HPI: CC: Bladder cancer  HPI:  01/24/2019  73 year old male presents with a primary complaint of urgency to urinate with low volume output. He also complains about mildly weak stream, nocturia 3 in difficulty postponing urination. AUA symptom score is 15. He denies a history of urinary tract infections except for once in the 1970s. He denies current hematuria or dysuria. Incidentally, he notes a 2 year history of intermittent gross hematuria. Most recently, the gross hematuria symptoms have increased and he had 3 episodes of gross hematuria last week. He has never had this worked up. He is asymptomatic in this regard. He started Flomax  about 2 weeks ago and has not noticed much of a difference.   02/01/2019  Patient underwent a CT IVP. This revealed multiple filling defects in his bladder consistent with likely tumors. Kidney and ureter were normal. PSA at the last visit was normal.   02/11/19: s/p 1st TURBT on 02/06/19. 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 He tolerated well and returns today for pathology review and TOV. Urine has been clear. He has no complaints today. In the operating room, he was found have a large amount of tumor. It was not completely resectable with one surgery.   02/26/2019  Patient is status post his second TURBT on 02/20/2019. 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 He has been doing well with the catheter. Urine is cleared up. He greatly desires to avoid cystectomy. He does understand that he has a large amount of tumor involvement and after 2 resections, I was not able to remove all the tumor. However, given no muscle involvement. He desires for me to try full resection endoscopically. 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   03/19/2019  Patient status post his third TURBT. He tolerated this well. Hematuria is resolving. 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 Muscle  was present in the specimen. All tumor was resected. After this third TURBT.   08/23/2019  Patient completed BCG. He presents for cystoscopy. He denies interval gross hematuria.   10/02/2019  Patient is status post TURBT. This revealed superficial low-grade urothelial cell carcinoma. No evidence of high-grade cancer. He has had no further gross hematuria. No pain.   02/27/2020  Patient completed repeat induction BCG. He presents for cystoscopy. He does also complain about significant urinary frequency and urgency as well as nocturia 2-4 times a night. He is on tamsulosin .   05/27/2020  Patient presents for surveillance cystoscopy. He completed his maintenance BCG. His urinary urgency and frequency have improved. Nocturia is intermittent. It depends on the amount that he drinks. He is not taking Myrbetriq. It did not really help. He underwent a CT of the abdomen and pelvis, CT IVP. This revealed no evidence of upper tract malignancy.   07/02/2020  Patient underwent TURBT --small with instillation of gemcitabine . Pathology revealed early noninvasive low-grade urothelial cell carcinoma. He has no complaints today.   11/27/2020:  S/p TURBT 11/20/2020 with benign urothelium and submucosa with dystrophic calcification and reactive giant cells. NED.   06/25/2021  Patient had repeat upper tract imaging that revealed no evidence of genitourinary malignancy. He presents for surveillance cystoscopy.   10/14/2021  Patient presents for surveillance cystoscopy after undergoing a cystoscopy with office fulguration a couple months ago.   12/31/2021  Patient presents today for destruction of bladder tumor 0.5 to 2 cm.   04/01/2022  Patient status post destruction of bladder tumors in the office. Presents for surveillance cystoscopy. Completed  maintenance BCG x3.   08/04/2022  Patient completed repeat maintenance BCG x3. Presents for surveillance cystoscopy. Does complain about some urinary complaints  including frequency, urgency, weak stream. Has taken tamsulosin  in the past but did not think that helped very much.   11/30/2022  Patient status post TURBT with instillation of gemcitabine . 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 Patient doing well.   06/01/2023  Patient completed intravesical gemcitabine . He presents for surveillance cystoscopy.   07/12/2023  Patient again underwent TURBT with instillation of gemcitabine  and bilateral retrograde pyelogram which were negative. Bladder tumor again showed noninvasive low-grade papillary urothelial cell carcinoma. He has now had recurrence despite BCG and intravesical gemcitabine  x 6 treatments.   10/24/2023  In the interval, the patient was evaluated by oncology and ultimately decided to continue cystoscopy surveillance and holding off on Keytruda treatment given that his recurrence has been low-grade. He presents for surveillance cystoscopy.   11/16/2023  Patient status post transurethral resection of bladder tumor with instillation of gemcitabine  and bilateral retrograde pyelogram. Pathology again revealed low-grade recurrence.   02/15/2024  Patient presents today for surveillance cystoscopy.   03/25/2024 Patient presents today for TURBT with instillation of gemcitabine  and bilateral retrograde pyelogram.  Cystoscopy in the office revealed:  Bladder:  No trabeculation. He had a 1 to 2 cm bladder tumor on the right lateral wall     ALLERGIES: No Allergies    MEDICATIONS: Calcium  Carbonate  Clobetasol Propionate PRN  diphenhydrAMINE  HCl 25 MG Capsule capsule PRN  Famotidine  20 MG Tablet  Mometasone Furoate PRN  Oxycodone -Acetaminophen  PRN  Pimecrolimus 1 % Cream PRN     GU PSH: Bladder Instill AntiCA Agent - 11/21/2022, 05/11/2022, 05/03/2022, 04/26/2022, 2023, 2023, 2023, 2022, 2021, 2021, 2021, 2021, 2021, 2021, 2021, 2020, 2020, 2020, 2020, 2020, 2020, 2020, 2020, 2020, 2020 Cystoscopy - 10/24/2023,  06/01/2023, 03/02/2023, 11/03/2022, 08/04/2022, 10/14/2021, 2022, 2022, 2021, 2021, 2021, 2020, 2020 Cystoscopy TURBT <2 cm - 2023, 2022, 2022, 2021 Cystoscopy TURBT >5 cm - 2020, 2020, 2020 Cystoscopy TURBT 2-5 cm - 11/21/2022, 2020 Locm 300-399Mg /Ml Iodine,1Ml - 2022, 2021, 2020       PSH Notes: spinal fusion 09-05-2021   NON-GU PSH: Visit Complexity (formerly GPC1X) - 11/16/2023, 07/12/2023     GU PMH: Bladder Cancer overlapping sites - 11/16/2023, - 10/24/2023, - 07/12/2023, - 06/01/2023, - 03/02/2023, - 11/30/2022, - 11/03/2022, - 08/04/2022, - 05/11/2022, - 05/03/2022, - 04/26/2022, - 04/01/2022, - 2023, - 2023, - 2023, - 2023, - 10/14/2021, - 2022, - 2022, - 2022, - 2022, - 2022, - 2021, HGT1 bladder cancer Catheter removed today We discussed proceeding with repeat TURBT for confirmatory staging and residual tumor resection. , - 2020 BPH w/LUTS - 03/02/2023, - 08/04/2022, - 2022, - 2021, - 2020 Nocturia - 03/02/2023, - 08/04/2022, - 2022 (Stable), - 2021, - 2020 Urinary Frequency - 08/04/2022, - 2021 Urinary Urgency - 08/04/2022, - 2022, - 2020 Weak Urinary Stream - 08/04/2022, - 2020 Bladder tumor/neoplasm - 2020 Encounter for Prostate Cancer screening - 2020 Gross hematuria - 2020    NON-GU PMH: No Non-GU PMH    FAMILY HISTORY: No Family History    SOCIAL HISTORY: Marital Status: Married    REVIEW OF SYSTEMS:    GU Review Male:   Patient denies frequent urination, hard to postpone urination, burning/ pain with urination, get up at night to urinate, leakage of urine, stream starts and stops, trouble starting your stream, have to strain to urinate , erection problems, and penile pain.  Gastrointestinal (Upper):   Patient denies nausea, vomiting, and indigestion/ heartburn.  Gastrointestinal (Lower):   Patient denies diarrhea and constipation.  Constitutional:   Patient denies fever, night sweats, weight loss, and fatigue.  Skin:   Patient denies skin rash/ lesion and itching.  Eyes:   Patient denies  blurred vision and double vision.  Ears/ Nose/ Throat:   Patient denies sore throat and sinus problems.  Hematologic/Lymphatic:   Patient denies swollen glands and easy bruising.  Cardiovascular:   Patient denies leg swelling and chest pains.  Respiratory:   Patient denies cough and shortness of breath.  Endocrine:   Patient denies excessive thirst.  Musculoskeletal:   Patient denies back pain and joint pain.  Neurological:   Patient denies headaches and dizziness.  Psychologic:   Patient denies depression and anxiety.   BP (!) 143/87 (BP Location: Right Arm)   Pulse (!) 58   Temp 97.9 F (36.6 C) (Oral)   Resp 16   SpO2 98%    MULTI-SYSTEM PHYSICAL EXAMINATION:    Constitutional: Well-nourished. No physical deformities. Normally developed. Good grooming.  Gastrointestinal: No mass, no tenderness, no rigidity, non obese abdomen.  Eyes: Normal conjunctivae. Normal eyelids.  Musculoskeletal: Normal gait and station of head and neck.    ASSESSMENT:      ICD-10 Details  1 GU:   Bladder Cancer overlapping sites - C67.8 Chronic, Stable   PLAN:     Proceed with transurethral resection of bladder tumor with instillation of gemcitabine  and bilateral retrograde pyelogram.

## 2024-03-25 NOTE — Anesthesia Preprocedure Evaluation (Signed)
 Anesthesia Evaluation  Patient identified by MRN, date of birth, ID band Patient awake    Reviewed: Allergy & Precautions, H&P , NPO status , Patient's Chart, lab work & pertinent test results  Airway Mallampati: II   Neck ROM: full    Dental   Pulmonary neg pulmonary ROS   breath sounds clear to auscultation       Cardiovascular hypertension,  Rhythm:regular Rate:Normal     Neuro/Psych  PSYCHIATRIC DISORDERS  Depression       GI/Hepatic ,GERD  ,,  Endo/Other    Renal/GU    Bladder CA    Musculoskeletal  (+) Arthritis ,    Abdominal   Peds  Hematology   Anesthesia Other Findings   Reproductive/Obstetrics                             Anesthesia Physical Anesthesia Plan  ASA: 3  Anesthesia Plan: General   Post-op Pain Management:    Induction: Intravenous  PONV Risk Score and Plan: 2 and Ondansetron , Dexamethasone  and Treatment may vary due to age or medical condition  Airway Management Planned: LMA  Additional Equipment:   Intra-op Plan:   Post-operative Plan: Extubation in OR  Informed Consent: I have reviewed the patients History and Physical, chart, labs and discussed the procedure including the risks, benefits and alternatives for the proposed anesthesia with the patient or authorized representative who has indicated his/her understanding and acceptance.     Dental advisory given  Plan Discussed with: CRNA, Anesthesiologist and Surgeon  Anesthesia Plan Comments:        Anesthesia Quick Evaluation

## 2024-03-25 NOTE — Transfer of Care (Signed)
 Immediate Anesthesia Transfer of Care Note  Patient: Jeremy Day  Procedure(s) Performed: TURBT, WITH CHEMOTHERAPEUTIC AGENT INSTILLATION INTO BLADDER  Patient Location: PACU  Anesthesia Type:General  Level of Consciousness: awake, alert , and oriented  Airway & Oxygen Therapy: Patient Spontanous Breathing and Patient connected to face mask oxygen  Post-op Assessment: Report given to RN and Post -op Vital signs reviewed and stable  Post vital signs: Reviewed and stable  Last Vitals:  Vitals Value Taken Time  BP 130/86 03/25/24 1035  Temp    Pulse 51 03/25/24 1038  Resp 13 03/25/24 1038  SpO2 100 % 03/25/24 1038  Vitals shown include unfiled device data.  Last Pain:  Vitals:   03/25/24 0851  TempSrc:   PainSc: 0-No pain         Complications: No notable events documented.

## 2024-03-26 LAB — SURGICAL PATHOLOGY

## 2024-03-26 NOTE — Anesthesia Postprocedure Evaluation (Signed)
 Anesthesia Post Note  Patient: Jeremy Day  Procedure(s) Performed: TURBT, WITH CHEMOTHERAPEUTIC AGENT INSTILLATION INTO BLADDER     Patient location during evaluation: PACU Anesthesia Type: General Level of consciousness: awake and alert Pain management: pain level controlled Vital Signs Assessment: post-procedure vital signs reviewed and stable Respiratory status: spontaneous breathing, nonlabored ventilation, respiratory function stable and patient connected to nasal cannula oxygen Cardiovascular status: blood pressure returned to baseline and stable Postop Assessment: no apparent nausea or vomiting Anesthetic complications: no   No notable events documented.  Last Vitals:  Vitals:   03/25/24 1257 03/25/24 1300  BP: (!) 134/91 (!) 137/99  Pulse:  (!) 50  Resp:  14  Temp: 36.4 C   SpO2:  97%    Last Pain:  Vitals:   03/25/24 1300  TempSrc:   PainSc: 0-No pain                 Mertie Haslem S

## 2024-04-03 DIAGNOSIS — C678 Malignant neoplasm of overlapping sites of bladder: Secondary | ICD-10-CM | POA: Diagnosis not present

## 2024-04-04 DIAGNOSIS — F330 Major depressive disorder, recurrent, mild: Secondary | ICD-10-CM | POA: Diagnosis not present

## 2024-04-04 DIAGNOSIS — C679 Malignant neoplasm of bladder, unspecified: Secondary | ICD-10-CM | POA: Diagnosis not present

## 2024-04-04 DIAGNOSIS — R29898 Other symptoms and signs involving the musculoskeletal system: Secondary | ICD-10-CM | POA: Diagnosis not present

## 2024-04-04 DIAGNOSIS — K429 Umbilical hernia without obstruction or gangrene: Secondary | ICD-10-CM | POA: Diagnosis not present

## 2024-04-12 ENCOUNTER — Other Ambulatory Visit: Payer: Self-pay | Admitting: Family Medicine

## 2024-04-12 ENCOUNTER — Ambulatory Visit
Admission: RE | Admit: 2024-04-12 | Discharge: 2024-04-12 | Disposition: A | Discharge status: Home / Self Care | Source: Ambulatory Visit | Attending: Family Medicine | Admitting: Family Medicine

## 2024-04-12 DIAGNOSIS — R29898 Other symptoms and signs involving the musculoskeletal system: Secondary | ICD-10-CM

## 2024-04-12 DIAGNOSIS — M25552 Pain in left hip: Secondary | ICD-10-CM | POA: Diagnosis not present

## 2024-04-12 DIAGNOSIS — M25551 Pain in right hip: Secondary | ICD-10-CM | POA: Diagnosis not present

## 2024-04-16 ENCOUNTER — Telehealth: Payer: Self-pay | Admitting: Nurse Practitioner

## 2024-04-16 NOTE — Telephone Encounter (Signed)
 I attempted to contact Jeremy Day to re-schedule his appointment per provider's request. Jeremy Day was not available for me to re-schedule his appointment.

## 2024-04-18 ENCOUNTER — Inpatient Hospital Stay: Attending: Nurse Practitioner | Admitting: Nurse Practitioner

## 2024-04-18 ENCOUNTER — Encounter: Payer: Self-pay | Admitting: Nurse Practitioner

## 2024-04-18 ENCOUNTER — Other Ambulatory Visit

## 2024-04-18 VITALS — BP 120/72 | HR 64 | Temp 98.0°F | Resp 16 | Ht 72.0 in | Wt 206.4 lb

## 2024-04-18 DIAGNOSIS — C678 Malignant neoplasm of overlapping sites of bladder: Secondary | ICD-10-CM | POA: Diagnosis not present

## 2024-04-18 DIAGNOSIS — R7303 Prediabetes: Secondary | ICD-10-CM | POA: Diagnosis not present

## 2024-04-18 DIAGNOSIS — N400 Enlarged prostate without lower urinary tract symptoms: Secondary | ICD-10-CM | POA: Insufficient documentation

## 2024-04-18 DIAGNOSIS — M1990 Unspecified osteoarthritis, unspecified site: Secondary | ICD-10-CM | POA: Insufficient documentation

## 2024-04-18 DIAGNOSIS — Z79899 Other long term (current) drug therapy: Secondary | ICD-10-CM | POA: Diagnosis not present

## 2024-04-18 DIAGNOSIS — I10 Essential (primary) hypertension: Secondary | ICD-10-CM | POA: Diagnosis not present

## 2024-04-18 DIAGNOSIS — Z85828 Personal history of other malignant neoplasm of skin: Secondary | ICD-10-CM | POA: Diagnosis not present

## 2024-04-18 DIAGNOSIS — Z87891 Personal history of nicotine dependence: Secondary | ICD-10-CM | POA: Insufficient documentation

## 2024-04-18 NOTE — Progress Notes (Addendum)
 [facility]   [phone] [phone]    Patient Care Team: Ransom Byers, MD as PCP - General (Family Medicine) Hugh Madura, MD as PCP - Cardiology (Cardiology) Sonja Brecon, MD as Attending Physician (Hematology and Oncology) Samson Croak, MD as Consulting Physician (Urology)   CHIEF COMPLAINT: Follow up recurrent bladder cancer   CURRENT THERAPY: Observation  INTERVAL HISTORY Jeremy Day returns for follow up. Last seen by Dr. Maryalice Smaller 08/2023 with plans to continue surveillance/observation. He underwent routine cystoscopy 11/10/23 with TURBT and intravesical gemcitabine  by Dr. Parke Boll. 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 He underwent same procedure 03/25/24, path showed 1 cm noninvasive high grade papillary urothelial carcinoma, muscularis propria not present.    Socially, he lives alone. Mobile and active at home. He has personal h/o non melanoma skin cancer on his scalp and family history of skin cancers. He is independent with ADLs. Denies alcohol or drug use and quit smoking in 1986. Other medical conditions include arthritis and pre-DM  He has recovered well from procedure. Baseline urgency and frequency are stable. Denies hematuria, pain, fever, or other concerns.    ROS  All other systems reviewed and negative  Past Medical History:  Diagnosis Date   Abnormal tandem walk    using 2 canes due to lower back arthritis   Ascending aorta dilatation (HCC)    echo  11-04-2022 aortoc root mild dilation 45mm, ascending aorta mild dilation 44 mm   Bladder tumor    BPH (benign prostatic hyperplasia)    Cancer (HCC) 2020   bladder   Depression    Diverticulosis    Full dentures    GERD (gastroesophageal reflux disease)    Hiatal hernia    History of colon polyps    History of staphylococcal infection 2008   right ankle  w/ sepsis   Hypertension    Meningitis spinal  age 15 or 4   OA (osteoarthritis)    lower back, right ankle,, bilateral knees and shouldres   Pre-diabetes    Psoriasis    PVC's (premature ventricular contractions)    2012-- hx bigeminy/ trigeminy with near syncope (cardiology consult note in epic dated 2012)  (last cardiology visit in epic w/ dr Renna Cary, 03-27-2016)   Seasonal allergies    Urgency of urination      Past Surgical History:  Procedure Laterality Date   ANKLE ARTHROSCOPY Right 02-13-2007    dr Murrell Arrant   w/ debridement and removal loose body   COLONOSCOPY  last one summer 2019   CYSTOSCOPY W/ RETROGRADES Bilateral 07/03/2023   Procedure: CYSTOSCOPY WITH BILATERAL RETROGRADE PYELOGRAM;  Surgeon: Samson Croak, MD;  Location: [facility];  Service: Urology;  Laterality: Bilateral;   INGUINAL HERNIA REPAIR Right 1980s   INGUINAL LYMPH NODE BIOPSY  2001  approx.   benign   lumbar back surgery  04/26/2023   MULTIPLE TOOTH EXTRACTIONS     SPINAL FUSION  08/2021   T8 through sacral area.   TONSILLECTOMY     TRANSURETHRAL RESECTION OF BLADDER TUMOR N/A 02/06/2019   Procedure: TRANSURETHRAL RESECTION OF BLADDER TUMOR (TURBT);  Surgeon: Samson Croak, MD;  [facility];  Service: Urology;  Laterality: N/A;   TRANSURETHRAL RESECTION OF BLADDER TUMOR N/A 02/20/2019   Procedure: TRANSURETHRAL RESECTION OF BLADDER TUMOR (TURBT);  Surgeon: Samson Croak, MD;  [facility];  Service:  Urology;  Laterality: N/A;   TRANSURETHRAL RESECTION OF BLADDER TUMOR N/A 03/13/2019   Procedure: TRANSURETHRAL RESECTION OF BLADDER TUMOR (TURBT);  Surgeon: Samson Croak, MD;  [facility];  Service: Urology;  Laterality: N/A;   TRANSURETHRAL RESECTION OF BLADDER TUMOR N/A 11/20/2020   Procedure: TRANSURETHRAL RESECTION OF BLADDER TUMOR (TURBT) WITH INSTILLATION OF post-op GEMCITABINE ;  Surgeon: Lahoma Pigg, MD;  Location: [facility];  Service: Urology;  Laterality: N/A;   TRANSURETHRAL  RESECTION OF BLADDER TUMOR WITH MITOMYCIN -C N/A 09/23/2019   Procedure: TRANSURETHRAL RESECTION OF BLADDER TUMOR WITH GEMCITABINE  INSTILLATION INTO THE BLADDER;  Surgeon: Samson Croak, MD;  Location: [facility];  Service: Urology;  Laterality: N/A;   TRANSURETHRAL RESECTION OF BLADDER TUMOR WITH MITOMYCIN -C N/A 06/24/2020   Procedure: TRANSURETHRAL RESECTION OF BLADDER TUMOR WITH POST OPERATIVE GEMCITABINE  INSTILLATION;  Surgeon: Samson Croak, MD;  Location: [facility] [place];  Service: Urology;  Laterality: N/A;   TRANSURETHRAL RESECTION OF BLADDER TUMOR WITH MITOMYCIN -C Bilateral 11/21/2022   Procedure: CYSTOSCOPY BILATERAL RETROGRADE PYELOGRAM TRANSURETHRAL RESECTION OF BLADDER TUMOR WITH GEMCITABINE ;  Surgeon: Samson Croak, MD;  [facility];  Service: Urology;  Laterality: Bilateral;  1 HR FOR CASE     Outpatient Encounter Medications as of 04/18/2024  Medication Sig   acetaminophen  (TYLENOL ) 500 MG tablet Take 1,000 mg by mouth every 6 (six) hours as needed for moderate pain.   calcium  carbonate (TUMS - DOSED IN MG ELEMENTAL CALCIUM ) 500 MG chewable tablet Chew 1 tablet by mouth daily as needed for indigestion or heartburn.   cetirizine (ZYRTEC) 10 MG tablet Take 10 mg by mouth daily as needed for allergies.   famotidine  (PEPCID ) 10 MG tablet Take 10 mg by mouth daily.   ibuprofen (ADVIL) 200 MG tablet Take 600 mg by mouth every 6 (six) hours as needed for moderate pain (pain score 4-6).   metoprolol  succinate (TOPROL -XL) 25 MG 24 hr tablet TAKE 1/2 TABLET(12.5 MG) BY MOUTH DAILY   pimecrolimus (ELIDEL) 1 % cream Apply 1 application  topically daily as needed (eczema).   triamcinolone  ointment (KENALOG) 0.1 % Apply 1 Application topically daily as needed (psoriasis).   DULoxetine (CYMBALTA) 60 MG capsule Take 60 mg by mouth daily. (Patient not taking: Reported on 04/18/2024)   HYDROcodone -acetaminophen  (NORCO/VICODIN) 5-325 MG tablet Take 1 tablet  by mouth every 6 (six) hours as needed. (Patient not taking: Reported on 04/18/2024)   meclizine (ANTIVERT) 25 MG tablet Take 25 mg by mouth 2 (two) times daily as needed for dizziness.   tamsulosin  (FLOMAX ) 0.4 MG CAPS capsule Take 0.4 mg by mouth daily. (Patient not taking: Reported on 04/18/2024)   No facility-administered encounter medications on file as of 04/18/2024.     Today's Vitals   04/18/24 1249  BP: 120/72  Pulse: 64  Resp: 16  Temp: 98 F (36.7 C)  TempSrc: Temporal  SpO2: 97%  Weight: 206 lb 6.4 oz (93.6 kg)  Height: 6' (1.829 m)  PainSc: 0-No pain   Body mass index is 27.99 kg/m.   ECOG PERFORMANCE STATUS: 0 - Asymptomatic  PHYSICAL EXAM GENERAL:alert, no distress and comfortable SKIN: no rash  EYES: sclera clear NECK: without mass LYMPH:  no palpable cervical or supraclavicular lymphadenopathy  LUNGS: clear with normal breathing effort HEART: regular rate & rhythm, no lower extremity edema ABDOMEN: abdomen soft, non-tender and normal bowel sounds NEURO: alert & oriented x 3 with fluent speech, no focal motor/sensory deficits  CBC    Latest Ref Rng & Units 03/13/2024   10:23 AM 10/30/2023   11:33 AM 08/08/2023   10:49 AM  CBC  WBC 4.0 - 10.5 K/uL 7.6  8.0  7.7   Hemoglobin 13.0 - 17.0 g/dL 19.1  47.8  29.5   Hematocrit 39.0 - 52.0 % 48.5  44.1  41.2   Platelets 150 - 400 K/uL 206  222  209       CMP     Latest Ref Rng & Units 03/13/2024   10:23 AM 08/08/2023   10:49 AM 07/03/2023    6:26 AM  CMP  Glucose 70 - 99 mg/dL 93  81  96   BUN 8 - 23 mg/dL 11  15  10    Creatinine 0.61 - 1.24 mg/dL 6.21  3.08  6.57   Sodium 135 - 145 mmol/L 135  138  140   Potassium 3.5 - 5.1 mmol/L 3.9  4.5  3.8   Chloride 98 - 111 mmol/L 100  105  101   CO2 22 - 32 mmol/L 28  30    Calcium  8.9 - 10.3 mg/dL 9.0  9.3    Total Protein 6.5 - 8.1 g/dL  6.7    Total Bilirubin 0.3 - 1.2 mg/dL  0.5    Alkaline Phos 38 - 126 U/L  71    AST 15 - 41 U/L  18    ALT 0 - 44 U/L   10        ASSESSMENT & PLAN: 73 yo male   Malignant neoplasm of overlapping sites of bladder -Initially diagnosed 02/06/19 with invasive high grade papillary urothelial carcinoma with squamous cell differentiation (20%), invading lamina propria, detrusor muscle present and not involved.  -s/p BCG and bladder instillation of gemcitabine  with TURBT, completed maintenance therapy 05/2020  -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 -Given the high risk disease, and multiple recurrence, he received intravesical gemcitabine  weekly for 6 treatments from 01/20/2023-02/24/2023 -He had recurrence in 09/02/2023, surgical path showed noninvasive low grade papillary urothelial carcinoma  -in 08/2023 Dr. Maryalice Smaller discussed systemic therapy with Keytruda q3 weeks for one year per [facility] guideline vs surveillance and he went on observation  -He had low grade recurrence in 11/2023 but most recent path showed noninvasive high grade papillary carcinoma,  -He has recovered well from procedure. Baseline urgency and frequency are stable, otherwise asymptomc  -Pt seen with Dr. Alita Irwin, we reviewed his history, past treatments, and most recent path. Discussed indication for adjuvant pembrolizumab and the potential risk, benefit, and SEs. Dr. Alita Irwin also reviewed potential cystectomy for potential cure.  -Discussed that if he has recurrence with muscle invasion will be more extensive and difficult to treat -After lengthy discussion and patients questions, he prefers to continue surveillance with q3 month cystoscopy.       PLAN: -Medical record and recent path reviewed -Discussed adjuvant immunotherapy vs surveillance -Pt opted to continue surveillance with Dr. Parke Boll q3 month cystoscopy -He will call us  if he decides to try Pembro -F/up open    All questions were answered.  The patient knows to call the clinic with any problems, questions or concerns. No barriers to learning were detected.  Jeremy Primeau K Kea Callan, Jeremy Day 04/18/2024    Addendum  Patient seen and discussed case extensively along with Jeremy Day Jeremy Day. 73 y.o. m.  With multiple recurrent NMIBC since 2020 and most recently with HG papillary urothelial carcinoma in May of 2025. Patient had failed BCG, gemcitabine  and here to discuss potentially pembrolizumab. We had long discussion on management of NMIBC. Given his multiple recurrence, any subsequent therapy will likely be palliative in nature, provide disease control, to potentially delay cystectomy. Discussed risk and benefit, efficacy of pembrolizumab. Discussed while a proportion of patient has response, 15-20% may face G3, or significant toxicities, sometimes life changing, at least requiring steroid treatment. There is likely a window for cure with cystectomy while PS is still adequate. He has good PS. He reports not interested in cystectomy. However, the potential side effects of pembrolizumab is concerning. After lengthy discussion and patients questions, he prefers to continue surveillance with q3 month cystoscopy. I let him know if he changes his mind feel free to call us . I will update Dr. Parke Boll.  Thank you for the consult. Please feel free to reach out with any questions.

## 2024-04-30 ENCOUNTER — Other Ambulatory Visit: Payer: Self-pay | Admitting: Family

## 2024-05-03 ENCOUNTER — Other Ambulatory Visit: Payer: Self-pay | Admitting: Cardiology

## 2024-06-03 DIAGNOSIS — M419 Scoliosis, unspecified: Secondary | ICD-10-CM | POA: Diagnosis not present

## 2024-06-12 DIAGNOSIS — L570 Actinic keratosis: Secondary | ICD-10-CM | POA: Diagnosis not present

## 2024-06-12 DIAGNOSIS — D044 Carcinoma in situ of skin of scalp and neck: Secondary | ICD-10-CM | POA: Diagnosis not present

## 2024-07-02 ENCOUNTER — Other Ambulatory Visit: Payer: Self-pay | Admitting: Cardiology

## 2024-07-02 DIAGNOSIS — C678 Malignant neoplasm of overlapping sites of bladder: Secondary | ICD-10-CM | POA: Diagnosis not present

## 2024-07-04 ENCOUNTER — Other Ambulatory Visit: Payer: Self-pay | Admitting: Urology

## 2024-07-05 ENCOUNTER — Encounter (HOSPITAL_COMMUNITY): Payer: Self-pay | Admitting: Urology

## 2024-07-05 MED ORDER — GEMCITABINE CHEMO FOR BLADDER INSTILLATION 2000 MG: 2000.0000 mg | Freq: Once | INTRAVENOUS | Status: AC

## 2024-07-05 NOTE — Progress Notes (Addendum)
 For Anesthesia: PCP - Lamarr GORMAN Rotunda, MD  Cardiologist - Oneil Parchment, MD/Dick, Jackee VEAR Raddle., NP   last office visit 04/04/23 in [facility]  Bowel Prep reminder:  Chest x-ray - 03/21/24 in [facility] EKG - 03/13/24 in [facility] Stress Test -  ECHO - 11/04/22 in [facility] Cardiac Cath -  Pacemaker/ICD device last checked: Pacemaker orders received: Device Rep notified:  Spinal Cord Stimulator:  Sleep Study -  CPAP -   Fasting Blood Sugar -  Checks Blood Sugar _____ times a day Date and result of last Hgb A1c-  Last dose of GLP1 agonist-  GLP1 instructions: Hold 7 days prior to schedule (Hold 24 hours-daily)   Last dose of SGLT-2 inhibitors-  SGLT-2 instructions: Hold 72 hours prior to surgery  Blood Thinner Instructions: Aspirin Instructions: Last Dose:  Activity level: Can go up a flight of stairs and activities of daily living without stopping and without chest pain and/or shortness of breath   Able to exercise without chest pain and/or shortness of breath   Unable to go up a flight of stairs without chest pain and/or shortness of breath     Anesthesia review:  h/o ventricular bigeminy with near syncope, aortic root dilation   Patient denies shortness of breath, fever, cough and chest pain at PAT appointment   Patient verbalized understanding of instructions that were reviewed over the telephone.

## 2024-07-09 ENCOUNTER — Telehealth: Payer: Self-pay | Admitting: Cardiology

## 2024-07-09 MED ORDER — METOPROLOL SUCCINATE ER 25 MG PO TB24: 12.5000 mg | ORAL_TABLET | Freq: Every day | ORAL | 0 refills | Status: DC

## 2024-07-09 NOTE — Telephone Encounter (Signed)
*  STAT* If patient is at the pharmacy, call can be transferred to refill team.   1. Which medications need to be refilled? (please list name of each medication and dose if known)   metoprolol  succinate (TOPROL -XL) 25 MG 24 hr tablet     4. Which pharmacy/location (including street and city if local pharmacy) is medication to be sent to?  [id number] - [place], [place] - [address]     5. Do they need a 30 day or 90 day supply? 90   Scheduled 07/16/24

## 2024-07-09 NOTE — Progress Notes (Signed)
 Attempted to obtain medical history via telephone, unable to reach at this time. HIPAA compliant voicemail message left requesting return call to pre surgical testing department.

## 2024-07-09 NOTE — Telephone Encounter (Signed)
 RX sent in

## 2024-07-10 ENCOUNTER — Other Ambulatory Visit: Payer: Self-pay

## 2024-07-10 ENCOUNTER — Encounter (HOSPITAL_COMMUNITY): Payer: Self-pay | Admitting: Urology

## 2024-07-10 NOTE — Progress Notes (Signed)
 Case: [id number] Date/Time: 07/15/24 1330   Procedures:      TURBT, WITH CHEMOTHERAPEUTIC AGENT INSTILLATION INTO BLADDER - INSTILL GEMCITABINE  INTRAVESICALLY     CYSTOSCOPY, WITH RETROGRADE PYELOGRAM (Bilateral)   Anesthesia type: General   Diagnosis: Malignant neoplasm of bladder neck (HCC) [C67.5]   Pre-op diagnosis: BLADDER CANCER   [facility]   Surgeons: Carolee Sherwood JONETTA DOUGLAS, MD       DISCUSSION: Jeremy Day is a 73 yo male who presents to PAT prior to surgery above. PMH of former smoking, bladder cancer s/p multiple TURBT, BPH, GERD, hiatal hernia, pre-diabetes, PVC's, dilated aortic root (45 mm on Echo 10/2022)   Patient follows with Cardiology for hx of ventricular bigeminy and aortic root dilatation. Last seen in clinic on 11/16/22 for pre op clearance for similar surgery on 11/21/2022. He was deemed low risk and was cleared for surgery at that time. He was scheduled for back surgery in May 2024 and was seen via tele visit in May 2024 and was cleared for that surgery as well. Echo in 10/2022 showed slightly increased aortic root from prior. Good BP control was recommended. Last TURBT was on 03/25/24 and was uncomplicated.    Patient follows with Oncology for recurrent bladder cancer. Last seen on 04/18/24. Patient opting for q3 month cystoscopies for surveillance    VS: Ht 6' (1.829 m)   Wt 90.7 kg   BMI 27.12 kg/m   PROVIDERS: Chrystal Lamarr RAMAN, MD   LABS: Obtain DOS   IMAGES:   EKG 03/13/24 Sinus bradycardia, rate 56 Left axis deviation Minimal voltage criteria for LVH, may be normal variant Possible Inferior infarct , age undetermined Poor anterior R wave progression When compared with ECG of 14-Nov-2022 09:01, No significant change since last tracing   CV: Echo 11/04/2022:   IMPRESSIONS      1. Left ventricular ejection fraction, by estimation, is 65 to 70%. Left ventricular ejection fraction by 3D volume is 68 %. The left ventricle  has normal function. The left ventricle has no regional wall motion abnormalities. There is moderate left ventricular hypertrophy of the basal-septal segment. Left ventricular diastolic parameters are consistent with Grade I diastolic dysfunction (impaired relaxation). The average left ventricular global longitudinal strain is -16.6 %. The global longitudinal strain is abnormal.  2. Right ventricular systolic function is normal. The right ventricular size is normal. There is normal pulmonary artery systolic pressure. The estimated right ventricular systolic pressure is 17.6 mmHg.  3. The mitral valve is normal in structure. Trivial mitral valve regurgitation.  4. The aortic valve is tricuspid. There is mild thickening of the aortic valve. Aortic valve regurgitation is trivial. Aortic valve sclerosis is present, with no evidence of aortic valve stenosis.  5. Aortic dilatation noted. There is mild dilatation of the aortic root, measuring 45 mm. There is mild dilatation of the ascending aorta, measuring 44 mm.  6. The inferior vena cava is normal in size with greater than 50% respiratory variability, suggesting right atrial pressure of 3 mmHg.   Comparison(s): No significant change from prior study. Past Medical History:  Diagnosis Date   Abnormal tandem walk    using 2 canes due to lower back arthritis   Ascending aorta dilatation (HCC)    echo  11-04-2022 aortoc root mild dilation 45mm, ascending aorta mild dilation 44 mm   Bladder tumor    BPH (benign prostatic hyperplasia)    Cancer (HCC) 2020   bladder   Depression  Diverticulosis    Full dentures    GERD (gastroesophageal reflux disease)    Hiatal hernia    History of blood transfusion    History of colon polyps    History of staphylococcal infection 2008   right ankle  w/ sepsis   HOH (hard of hearing)    Hypertension    Meningitis spinal age 49 or 4   OA (osteoarthritis)    lower back, right ankle,, bilateral knees  and shouldres   Pre-diabetes    Psoriasis    PVC's (premature ventricular contractions)    2012-- hx bigeminy/ trigeminy with near syncope (cardiology consult note in epic dated 2012)  (last cardiology visit in epic w/ dr jeffrie, 03-27-2016)   Seasonal allergies    Urgency of urination    Ventricular bigeminy    Wears hearing aid in both ears     Past Surgical History:  Procedure Laterality Date   ANKLE ARTHROSCOPY Right 02-13-2007    dr yvone   w/ debridement and removal loose body   COLONOSCOPY  last one summer 2019   CYSTOSCOPY W/ RETROGRADES Bilateral 07/03/2023   Procedure: CYSTOSCOPY WITH BILATERAL RETROGRADE PYELOGRAM;  Surgeon: Carolee Sherwood JONETTA DOUGLAS, MD;  Location: [facility];  Service: Urology;  Laterality: Bilateral;   INGUINAL HERNIA REPAIR Right 1980s   INGUINAL LYMPH NODE BIOPSY  2001  approx.   benign   lumbar back surgery  04/26/2023   MULTIPLE TOOTH EXTRACTIONS     SPINAL FUSION  08/2021   T8 through sacral area.   TONSILLECTOMY     TRANSURETHRAL RESECTION OF BLADDER TUMOR N/A 02/06/2019   Procedure: TRANSURETHRAL RESECTION OF BLADDER TUMOR (TURBT);  Surgeon: Carolee Sherwood JONETTA DOUGLAS, MD;  [facility];  Service: Urology;  Laterality: N/A;   TRANSURETHRAL RESECTION OF BLADDER TUMOR N/A 02/20/2019   Procedure: TRANSURETHRAL RESECTION OF BLADDER TUMOR (TURBT);  Surgeon: Carolee Sherwood JONETTA DOUGLAS, MD;  [facility];  Service: Urology;  Laterality: N/A;   TRANSURETHRAL RESECTION OF BLADDER TUMOR N/A 03/13/2019   Procedure: TRANSURETHRAL RESECTION OF BLADDER TUMOR (TURBT);  Surgeon: Carolee Sherwood JONETTA DOUGLAS, MD;  [facility];  Service: Urology;  Laterality: N/A;   TRANSURETHRAL RESECTION OF BLADDER TUMOR N/A 11/20/2020   Procedure: TRANSURETHRAL RESECTION OF BLADDER TUMOR (TURBT) WITH INSTILLATION OF post-op GEMCITABINE ;  Surgeon: Selma Donnice SAUNDERS, MD;  Location: [facility];  Service: Urology;  Laterality: N/A;   TRANSURETHRAL RESECTION OF BLADDER  TUMOR WITH MITOMYCIN -C N/A 09/23/2019   Procedure: TRANSURETHRAL RESECTION OF BLADDER TUMOR WITH GEMCITABINE  INSTILLATION INTO THE BLADDER;  Surgeon: Carolee Sherwood JONETTA DOUGLAS, MD;  Location: [facility];  Service: Urology;  Laterality: N/A;   TRANSURETHRAL RESECTION OF BLADDER TUMOR WITH MITOMYCIN -C N/A 06/24/2020   Procedure: TRANSURETHRAL RESECTION OF BLADDER TUMOR WITH POST OPERATIVE GEMCITABINE  INSTILLATION;  Surgeon: Carolee Sherwood JONETTA DOUGLAS, MD;  Location: [facility];  Service: Urology;  Laterality: N/A;   TRANSURETHRAL RESECTION OF BLADDER TUMOR WITH MITOMYCIN -C Bilateral 11/21/2022   Procedure: CYSTOSCOPY BILATERAL RETROGRADE PYELOGRAM TRANSURETHRAL RESECTION OF BLADDER TUMOR WITH GEMCITABINE ;  Surgeon: Carolee Sherwood JONETTA DOUGLAS, MD;  [facility];  Service: Urology;  Laterality: Bilateral;  1 HR FOR CASE    MEDICATIONS: No current facility-administered medications for this encounter.    acetaminophen  (TYLENOL ) 500 MG tablet   calcium  carbonate (TUMS - DOSED IN MG ELEMENTAL CALCIUM ) 500 MG chewable tablet   cetirizine (ZYRTEC) 10 MG tablet   DULoxetine (CYMBALTA) 60 MG capsule  famotidine  (PEPCID ) 10 MG tablet   HYDROcodone -acetaminophen  (NORCO/VICODIN) 5-325 MG tablet   ibuprofen (ADVIL) 200 MG tablet   meclizine (ANTIVERT) 25 MG tablet   metoprolol  succinate (TOPROL -XL) 25 MG 24 hr tablet   pimecrolimus (ELIDEL) 1 % cream   tamsulosin  (FLOMAX ) 0.4 MG CAPS capsule   triamcinolone  ointment (KENALOG) 0.1 %    gemcitabine  (GEMZAR ) chemo syringe for bladder instillation 2,000 mg

## 2024-07-10 NOTE — Anesthesia Preprocedure Evaluation (Addendum)
 Anesthesia Evaluation  Patient identified by MRN, date of birth, ID band Patient awake    Reviewed: Allergy & Precautions, Patient's Chart, lab work & pertinent test results  Airway Mallampati: II  TM Distance: >3 FB Neck ROM: Full    Dental  (+) Edentulous Upper, Edentulous Lower   Pulmonary former smoker   breath sounds clear to auscultation       Cardiovascular hypertension, Pt. on home beta blockers  Rhythm:Regular Rate:Normal     Neuro/Psych  PSYCHIATRIC DISORDERS  Depression     Neuromuscular disease    GI/Hepatic Neg liver ROS, hiatal hernia,GERD  Medicated,,  Endo/Other  negative endocrine ROS    Renal/GU negative Renal ROS     Musculoskeletal  (+) Arthritis ,    Abdominal   Peds  Hematology negative hematology ROS (+)   Anesthesia Other Findings   Reproductive/Obstetrics                              Anesthesia Physical Anesthesia Plan  ASA: 3  Anesthesia Plan: General   Post-op Pain Management: Tylenol  PO (pre-op)*   Induction: Intravenous  PONV Risk Score and Plan: 3 and Ondansetron  and Treatment may vary due to age or medical condition  Airway Management Planned: LMA  Additional Equipment: None  Intra-op Plan:   Post-operative Plan: Extubation in OR  Informed Consent: I have reviewed the patients History and Physical, chart, labs and discussed the procedure including the risks, benefits and alternatives for the proposed anesthesia with the patient or authorized representative who has indicated his/her understanding and acceptance.       Plan Discussed with: CRNA  Anesthesia Plan Comments: (See PAT note from 9/3)         Anesthesia Quick Evaluation

## 2024-07-15 ENCOUNTER — Encounter (HOSPITAL_COMMUNITY)
Admission: RE | Disposition: A | Discharge status: Home / Self Care | Payer: Self-pay | Source: Home / Self Care | Attending: Urology

## 2024-07-15 ENCOUNTER — Other Ambulatory Visit: Payer: Self-pay

## 2024-07-15 ENCOUNTER — Ambulatory Visit (HOSPITAL_COMMUNITY): Payer: Self-pay | Admitting: Medical

## 2024-07-15 ENCOUNTER — Ambulatory Visit (HOSPITAL_COMMUNITY)

## 2024-07-15 ENCOUNTER — Ambulatory Visit (HOSPITAL_BASED_OUTPATIENT_CLINIC_OR_DEPARTMENT_OTHER): Payer: Self-pay | Admitting: Medical

## 2024-07-15 ENCOUNTER — Encounter (HOSPITAL_COMMUNITY): Payer: Self-pay | Admitting: Urology

## 2024-07-15 ENCOUNTER — Ambulatory Visit (HOSPITAL_COMMUNITY)
Admission: RE | Admit: 2024-07-15 | Discharge: 2024-07-15 | Disposition: A | Discharge status: Home / Self Care | Attending: Urology | Admitting: Urology

## 2024-07-15 DIAGNOSIS — K449 Diaphragmatic hernia without obstruction or gangrene: Secondary | ICD-10-CM | POA: Insufficient documentation

## 2024-07-15 DIAGNOSIS — C679 Malignant neoplasm of bladder, unspecified: Secondary | ICD-10-CM

## 2024-07-15 DIAGNOSIS — K219 Gastro-esophageal reflux disease without esophagitis: Secondary | ICD-10-CM | POA: Diagnosis not present

## 2024-07-15 DIAGNOSIS — Z87891 Personal history of nicotine dependence: Secondary | ICD-10-CM | POA: Diagnosis not present

## 2024-07-15 DIAGNOSIS — F32A Depression, unspecified: Secondary | ICD-10-CM

## 2024-07-15 DIAGNOSIS — R351 Nocturia: Secondary | ICD-10-CM | POA: Diagnosis not present

## 2024-07-15 DIAGNOSIS — I2510 Atherosclerotic heart disease of native coronary artery without angina pectoris: Secondary | ICD-10-CM

## 2024-07-15 DIAGNOSIS — C675 Malignant neoplasm of bladder neck: Secondary | ICD-10-CM | POA: Diagnosis not present

## 2024-07-15 DIAGNOSIS — N401 Enlarged prostate with lower urinary tract symptoms: Secondary | ICD-10-CM | POA: Diagnosis not present

## 2024-07-15 DIAGNOSIS — D494 Neoplasm of unspecified behavior of bladder: Secondary | ICD-10-CM | POA: Diagnosis not present

## 2024-07-15 DIAGNOSIS — I10 Essential (primary) hypertension: Secondary | ICD-10-CM

## 2024-07-15 DIAGNOSIS — D090 Carcinoma in situ of bladder: Secondary | ICD-10-CM | POA: Diagnosis not present

## 2024-07-15 DIAGNOSIS — Z01818 Encounter for other preprocedural examination: Secondary | ICD-10-CM

## 2024-07-15 HISTORY — DX: Unspecified hearing loss, unspecified ear: H91.90

## 2024-07-15 HISTORY — DX: Other specified cardiac arrhythmias: I49.8

## 2024-07-15 HISTORY — PX: CYSTOSCOPY W/ RETROGRADES: SHX1426

## 2024-07-15 HISTORY — DX: Personal history of other medical treatment: Z92.89

## 2024-07-15 HISTORY — DX: Presence of external hearing-aid: Z97.4

## 2024-07-15 LAB — CBC
HCT: 47.8 % (ref 39.0–52.0)
Hemoglobin: 15.6 g/dL (ref 13.0–17.0)
MCH: 31.6 pg (ref 26.0–34.0)
MCHC: 32.6 g/dL (ref 30.0–36.0)
MCV: 96.8 fL (ref 80.0–100.0)
Platelets: 196 K/uL (ref 150–400)
RBC: 4.94 MIL/uL (ref 4.22–5.81)
RDW: 14.4 % (ref 11.5–15.5)
WBC: 8 K/uL (ref 4.0–10.5)
nRBC: 0 % (ref 0.0–0.2)

## 2024-07-15 LAB — BASIC METABOLIC PANEL WITH GFR
Anion gap: 10 (ref 5–15)
BUN: 12 mg/dL (ref 8–23)
CO2: 26 mmol/L (ref 22–32)
Calcium: 9.3 mg/dL (ref 8.9–10.3)
Chloride: 102 mmol/L (ref 98–111)
Creatinine, Ser: 0.81 mg/dL (ref 0.61–1.24)
GFR, Estimated: >60 mL/min (ref >60)
Glucose, Bld: 88 mg/dL (ref 70–99)
Potassium: 4.2 mmol/L (ref 3.5–5.1)
Sodium: 138 mmol/L (ref 135–145)

## 2024-07-15 SURGERY — TURBT, WITH CHEMOTHERAPEUTIC AGENT INSTILLATION INTO BLADDER
Anesthesia: General

## 2024-07-15 MED ORDER — LIDOCAINE HCL (PF) 2 % IJ SOLN
INTRAMUSCULAR | Status: DC | PRN
  Administered 2024-07-15: 60 mg via INTRADERMAL

## 2024-07-15 MED ORDER — ORAL CARE MOUTH RINSE: 15.0000 mL | Freq: Once | OROMUCOSAL | Status: AC

## 2024-07-15 MED ORDER — DEXAMETHASONE SODIUM PHOSPHATE 10 MG/ML IJ SOLN
INTRAMUSCULAR | Status: DC | PRN
  Administered 2024-07-15: 10 mg via INTRAVENOUS

## 2024-07-15 MED ORDER — LIDOCAINE HCL (PF) 2 % IJ SOLN
INTRAMUSCULAR | Status: AC
  Filled 2024-07-15: qty 5

## 2024-07-15 MED ORDER — EPHEDRINE SULFATE-NACL 50-0.9 MG/10ML-% IV SOSY
PREFILLED_SYRINGE | INTRAVENOUS | Status: DC | PRN
  Administered 2024-07-15: 5 mg via INTRAVENOUS

## 2024-07-15 MED ORDER — IOHEXOL 300 MG/ML  SOLN
INTRAMUSCULAR | Status: DC | PRN
  Administered 2024-07-15: 10 mL

## 2024-07-15 MED ORDER — MIDAZOLAM HCL 2 MG/2ML IJ SOLN
INTRAMUSCULAR | Status: AC
  Filled 2024-07-15: qty 2

## 2024-07-15 MED ORDER — SODIUM CHLORIDE 0.9 % IR SOLN
Status: DC | PRN
  Administered 2024-07-15: 3000 mL

## 2024-07-15 MED ORDER — ROCURONIUM BROMIDE 10 MG/ML (PF) SYRINGE
PREFILLED_SYRINGE | INTRAVENOUS | Status: AC
  Filled 2024-07-15: qty 10

## 2024-07-15 MED ORDER — CHLORHEXIDINE GLUCONATE 0.12 % MT SOLN
15.0000 mL | Freq: Once | OROMUCOSAL | Status: AC
  Administered 2024-07-15: 15 mL via OROMUCOSAL

## 2024-07-15 MED ORDER — FENTANYL CITRATE PF 50 MCG/ML IJ SOSY: 25.0000 ug | PREFILLED_SYRINGE | INTRAMUSCULAR | Status: DC | PRN

## 2024-07-15 MED ORDER — ACETAMINOPHEN 10 MG/ML IV SOLN: 1000.0000 mg | Freq: Once | INTRAVENOUS | Status: DC | PRN

## 2024-07-15 MED ORDER — GEMCITABINE CHEMO FOR BLADDER INSTILLATION 2000 MG
2000.0000 mg | Freq: Once | INTRAVENOUS | Status: AC
  Administered 2024-07-15: 2000 mg via INTRAVESICAL
  Filled 2024-07-15: qty 2000

## 2024-07-15 MED ORDER — ONDANSETRON HCL 4 MG/2ML IJ SOLN
INTRAMUSCULAR | Status: DC | PRN
Start: 2024-07-15 — End: 2024-07-15
  Administered 2024-07-15: 4 mg via INTRAVENOUS

## 2024-07-15 MED ORDER — FENTANYL CITRATE (PF) 100 MCG/2ML IJ SOLN
INTRAMUSCULAR | Status: DC | PRN
  Administered 2024-07-15 (×2): 50 ug via INTRAVENOUS

## 2024-07-15 MED ORDER — PROPOFOL 10 MG/ML IV BOLUS
INTRAVENOUS | Status: DC | PRN
  Administered 2024-07-15: 120 mg via INTRAVENOUS

## 2024-07-15 MED ORDER — FENTANYL CITRATE (PF) 100 MCG/2ML IJ SOLN
INTRAMUSCULAR | Status: AC
  Filled 2024-07-15: qty 2

## 2024-07-15 MED ORDER — ONDANSETRON HCL 4 MG/2ML IJ SOLN
INTRAMUSCULAR | Status: AC
  Filled 2024-07-15: qty 2

## 2024-07-15 MED ORDER — DROPERIDOL 2.5 MG/ML IJ SOLN: 0.6250 mg | Freq: Once | INTRAMUSCULAR | Status: DC | PRN

## 2024-07-15 MED ORDER — OXYCODONE HCL 5 MG PO TABS: 5.0000 mg | ORAL_TABLET | Freq: Once | ORAL | Status: DC | PRN

## 2024-07-15 MED ORDER — PROPOFOL 10 MG/ML IV BOLUS
INTRAVENOUS | Status: AC
  Filled 2024-07-15: qty 20

## 2024-07-15 MED ORDER — DEXAMETHASONE SODIUM PHOSPHATE 10 MG/ML IJ SOLN
INTRAMUSCULAR | Status: AC
  Filled 2024-07-15: qty 1

## 2024-07-15 MED ORDER — CEFAZOLIN SODIUM-DEXTROSE 2-4 GM/100ML-% IV SOLN
2.0000 g | INTRAVENOUS | Status: AC
  Administered 2024-07-15: 2 g via INTRAVENOUS
  Filled 2024-07-15: qty 100

## 2024-07-15 MED ORDER — LACTATED RINGERS IV SOLN: INTRAVENOUS | Status: DC

## 2024-07-15 MED ORDER — OXYCODONE HCL 5 MG/5ML PO SOLN: 5.0000 mg | Freq: Once | ORAL | Status: DC | PRN

## 2024-07-15 MED ORDER — STERILE WATER FOR INJECTION IJ SOLN
INTRAMUSCULAR | Status: AC
  Filled 2024-07-15: qty 10

## 2024-07-15 SURGICAL SUPPLY — 21 items
BAG URINE DRAIN 2000ML AR STRL (UROLOGICAL SUPPLIES) IMPLANT
BAG URO CATCHER STRL LF (MISCELLANEOUS) ×3 IMPLANT
CATH FOLEY 2WAY SLVR 5CC 18FR (CATHETERS) IMPLANT
CATH URETL OPEN END 6FR 70 (CATHETERS) ×3 IMPLANT
CLOTH BEACON ORANGE TIMEOUT ST (SAFETY) ×3 IMPLANT
DRAPE FOOT SWITCH (DRAPES) ×3 IMPLANT
ELECT REM PT RETURN 15FT ADLT (MISCELLANEOUS) IMPLANT
GLOVE BIO SURGEON STRL SZ7.5 (GLOVE) ×3 IMPLANT
GOWN STRL REUS W/ TWL XL LVL3 (GOWN DISPOSABLE) ×3 IMPLANT
GUIDEWIRE STR DUAL SENSOR (WIRE) IMPLANT
GUIDEWIRE STRT TIP .038X150X3 (WIRE) IMPLANT
KIT TURNOVER KIT A (KITS) ×3 IMPLANT
LOOP CUT BIPOLAR 24F LRG (ELECTROSURGICAL) IMPLANT
MANIFOLD NEPTUNE II (INSTRUMENTS) ×3 IMPLANT
NS IRRIG 1000ML POUR BTL (IV SOLUTION) IMPLANT
PACK CYSTO (CUSTOM PROCEDURE TRAY) ×3 IMPLANT
PAD TELFA 2X3 NADH STRL (GAUZE/BANDAGES/DRESSINGS) IMPLANT
PLUG CATH AND CAP STRL 200 (CATHETERS) IMPLANT
SYRINGE TOOMEY IRRIG 70ML (MISCELLANEOUS) IMPLANT
TUBING CONNECTING 10 (TUBING) ×3 IMPLANT
TUBING UROLOGY SET (TUBING) IMPLANT

## 2024-07-15 NOTE — H&P (Signed)
 CC/HPI: CC: Bladder cancer  HPI:  01/24/2019  73 year old male presents with a primary complaint of urgency to urinate with low volume output. He also complains about mildly weak stream, nocturia 3 in difficulty postponing urination. AUA symptom score is 15. He denies a history of urinary tract infections except for once in the 1970s. He denies current hematuria or dysuria. Incidentally, he notes a 2 year history of intermittent gross hematuria. Most recently, the gross hematuria symptoms have increased and he had 3 episodes of gross hematuria last week. He has never had this worked up. He is asymptomatic in this regard. He started Flomax  about 2 weeks ago and has not noticed much of a difference.   02/01/2019  Patient underwent a CT IVP. This revealed multiple filling defects in his bladder consistent with likely tumors. Kidney and ureter were normal. PSA at the last visit was normal.   02/11/19: s/p 1st TURBT on 02/06/19. 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 He tolerated well and returns today for pathology review and TOV. Urine has been clear. He has no complaints today. In the operating room, he was found have a large amount of tumor. It was not completely resectable with one surgery.   02/26/2019  Patient is status post his second TURBT on 02/20/2019. 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 He has been doing well with the catheter. Urine is cleared up. He greatly desires to avoid cystectomy. He does understand that he has a large amount of tumor involvement and after 2 resections, I was not able to remove all the tumor. However, given no muscle involvement. He desires for me to try full resection endoscopically. 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   03/19/2019  Patient status post his third TURBT. He tolerated this well. Hematuria is resolving. 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 Muscle  was present in the specimen. All tumor was resected. After this third TURBT.   08/23/2019  Patient completed BCG. He presents for cystoscopy. He denies interval gross hematuria.   10/02/2019  Patient is status post TURBT. This revealed superficial low-grade urothelial cell carcinoma. No evidence of high-grade cancer. He has had no further gross hematuria. No pain.   02/27/2020  Patient completed repeat induction BCG. He presents for cystoscopy. He does also complain about significant urinary frequency and urgency as well as nocturia 2-4 times a night. He is on tamsulosin .   05/27/2020  Patient presents for surveillance cystoscopy. He completed his maintenance BCG. His urinary urgency and frequency have improved. Nocturia is intermittent. It depends on the amount that he drinks. He is not taking Myrbetriq. It did not really help. He underwent a CT of the abdomen and pelvis, CT IVP. This revealed no evidence of upper tract malignancy.   07/02/2020  Patient underwent TURBT --small with instillation of gemcitabine . Pathology revealed early noninvasive low-grade urothelial cell carcinoma. He has no complaints today.   11/27/2020:  S/p TURBT 11/20/2020 with benign urothelium and submucosa with dystrophic calcification and reactive giant cells. NED.   06/25/2021  Patient had repeat upper tract imaging that revealed no evidence of genitourinary malignancy. He presents for surveillance cystoscopy.   10/14/2021  Patient presents for surveillance cystoscopy after undergoing a cystoscopy with office fulguration a couple months ago.   12/31/2021  Patient presents today for destruction of bladder tumor 0.5 to 2 cm.   04/01/2022  Patient status post destruction of bladder tumors in the office. Presents for surveillance cystoscopy. Completed  maintenance BCG x3.   08/04/2022  Patient completed repeat maintenance BCG x3. Presents for surveillance cystoscopy. Does complain about some urinary complaints  including frequency, urgency, weak stream. Has taken tamsulosin  in the past but did not think that helped very much.   11/30/2022  Patient status post TURBT with instillation of gemcitabine . 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 Patient doing well.   06/01/2023  Patient completed intravesical gemcitabine . He presents for surveillance cystoscopy.   07/12/2023  Patient again underwent TURBT with instillation of gemcitabine  and bilateral retrograde pyelogram which were negative. Bladder tumor again showed noninvasive low-grade papillary urothelial cell carcinoma. He has now had recurrence despite BCG and intravesical gemcitabine  x 6 treatments.   10/24/2023  In the interval, the patient was evaluated by oncology and ultimately decided to continue cystoscopy surveillance and holding off on Keytruda treatment given that his recurrence has been low-grade. He presents for surveillance cystoscopy.   11/16/2023  Patient status post transurethral resection of bladder tumor with instillation of gemcitabine  and bilateral retrograde pyelogram. Pathology again revealed low-grade recurrence.   02/15/2024  Patient presents today for surveillance cystoscopy.   04/03/2024  Patient status post TURBT with instillation of gemcitabine . Pathology revealed noninvasive high-grade papillary urothelial carcinoma.   07/02/2024  Patient presents today for surveillance cystoscopy. He met with oncology in the interval and decided against Keytruda.     ALLERGIES: None   MEDICATIONS: Calcium  Carbonate  Clobetasol Propionate PRN  diphenhydrAMINE  HCl 25 MG Capsule capsule PRN  Famotidine  20 MG Tablet  Mometasone Furoate PRN  Oxycodone -Acetaminophen  PRN  Pimecrolimus 1 % Cream PRN     GU PSH: Bladder Instill AntiCA Agent - 2024, 2023, 2023, 2023, 2023, 2023, 2023, 2022, 2021, 2021, 2021, 2021, 2021, 2021, 2021, 2020, 2020, 2020, 2020, 2020, 2020, 2020, 2020, 2020, 2020 Cystoscopy -  02/15/2024, 10/24/2023, 06/01/2023, 03/02/2023, 11/03/2022, 08/04/2022, 2022, 2022, 2022, 2021, 2021, 2021, 2020, 2020 Cystoscopy TURBT <2 cm - 2023, 2022, 2022, 2021 Cystoscopy TURBT >5 cm - 2020, 2020, 2020 Cystoscopy TURBT 2-5 cm - 2024, 2020 Locm 300-399Mg /Ml Iodine,1Ml - 2022, 2021, 2020       PSH Notes: spinal fusion 09-05-2021   NON-GU PSH: Visit Complexity (formerly GPC1X) - 04/03/2024, 11/16/2023, 07/12/2023     GU PMH: Bladder Cancer overlapping sites - 04/03/2024, - 02/15/2024, - 11/16/2023, - 10/24/2023, - 07/12/2023, - 06/01/2023, - 03/02/2023, - 2024, - 11/03/2022, - 08/04/2022, - 2023, - 2023, - 2023, - 2023, - 2023, - 2023, - 2023, - 2023, - 2022, - 2022, - 2022, - 2022, - 2022, - 2022, - 2021, HGT1 bladder cancer Catheter removed today We discussed proceeding with repeat TURBT for confirmatory staging and residual tumor resection. , - 2020 BPH w/LUTS - 03/02/2023, - 08/04/2022, - 2022, - 2021, - 2020 Nocturia - 03/02/2023, - 08/04/2022, - 2022 (Stable), - 2021, - 2020 Urinary Frequency - 08/04/2022, - 2021 Urinary Urgency - 08/04/2022, - 2022, - 2020 Weak Urinary Stream - 08/04/2022, - 2020 Bladder tumor/neoplasm - 2020 Encounter for Prostate Cancer screening - 2020 Gross hematuria - 2020    NON-GU PMH: None   FAMILY HISTORY: None   SOCIAL HISTORY: Marital Status: Married    REVIEW OF SYSTEMS:    GU Review Male:   Patient denies frequent urination, hard to postpone urination, burning/ pain with urination, get up at night to urinate, leakage of urine, stream starts and stops, trouble starting your stream, have to strain to urinate , erection problems, and penile pain.  Gastrointestinal (Upper):  Patient denies nausea, vomiting, and indigestion/ heartburn.  Gastrointestinal (Lower):   Patient denies diarrhea and constipation.  Constitutional:   Patient denies fever, night sweats, weight loss, and fatigue.  Skin:   Patient denies skin rash/ lesion and itching.  Eyes:   Patient denies  blurred vision and double vision.  Ears/ Nose/ Throat:   Patient denies sore throat and sinus problems.  Hematologic/Lymphatic:   Patient denies swollen glands and easy bruising.  Cardiovascular:   Patient denies leg swelling and chest pains.  Respiratory:   Patient denies cough and shortness of breath.  Endocrine:   Patient denies excessive thirst.  Musculoskeletal:   Patient denies joint pain and back pain.  Neurological:   Patient denies headaches and dizziness.  Psychologic:   Patient denies depression and anxiety.   VITAL SIGNS: None   MULTI-SYSTEM PHYSICAL EXAMINATION:    Constitutional: Well-nourished. No physical deformities. Normally developed. Good grooming.  Gastrointestinal: No mass, no tenderness, no rigidity, non obese abdomen.  Eyes: Normal conjunctivae. Normal eyelids.  Musculoskeletal: Normal gait and station of head and neck.     Complexity of Data:  Source Of History:  Patient  Records Review:   Previous Doctor Records, Previous Patient Records   01/24/19  PSA  Total PSA 2.93 ng/mL    PROCEDURES:         Flexible Cystoscopy - 52000  Risks, benefits, and some of the potential complications of the procedure were discussed at length with the patient including infection, bleeding, voiding discomfort, urinary retention, fever, chills, sepsis, and others. All questions were answered. Informed consent was obtained. Antibiotic prophylaxis was given. Sterile technique and intraurethral analgesia were used.  Meatus:  Normal size. Normal location. Normal condition.  Urethra:  No strictures.  External Sphincter:  Normal.  Verumontanum:  Normal.  Prostate:  Borderline obstructing. Mild hyperplasia.  Bladder Neck:  Non-obstructing.  Ureteral Orifices:  Normal location. Normal size. Normal shape. Effluxed clear urine.  Bladder:  Moderate trabeculation. Evidence of prior resection at several sites. He had a 2 cm papillary bladder tumor at the left bladder neck anteriorly       The lower urinary tract was carefully examined. The procedure was well-tolerated and without complications. Antibiotic instructions were given. Instructions were given to call the office immediately for bloody urine, difficulty urinating, urinary retention, painful or frequent urination, fever, chills, nausea, vomiting or other illness. The patient stated that he understood these instructions and would comply with them.         Urinalysis - 81003 Dipstick Dipstick Cont'd  Color: Yellow Bilirubin: Neg  Appearance: Clear Ketones: Neg  Specific Gravity: 1.025 Blood: Neg  pH: 6.0 Protein: Trace  Glucose: Neg Urobilinogen: 0.2    Nitrites: Neg    Leukocyte Esterase: Neg    Notes:      ASSESSMENT:      ICD-10 Details  1 GU:   Bladder Cancer overlapping sites - C67.8 Chronic, Stable   PLAN:           Document Letter(s):  Created for Patient: Clinical Summary         Notes:   Patient has yet another reoccurrence. Risk benefits of transurethral resection of bladder tumor were discussed. He is very knowledgeable about this process at this point. Will proceed with TURBT with instillation of gemcitabine  and bilateral retrograde pyelogram.   This was a very quick recurrence. Probably consider Keytruda or other additional therapy such as a intravesical agent   CC: Dr. Douglass  Dr. Tina  Signed by Sherwood Edison, III, M.D. on 07/03/24 at 2:22 PM (EDT

## 2024-07-15 NOTE — Op Note (Signed)
 Operative Note  Preoperative diagnosis:  1.  Bladder tumor  Postoperative diagnosis: 1.  Bladder tumor--medium  Procedure(s): 1.  Cystoscopy with bilateral retrograde pyelogram  2.  Transurethral resection of bladder tumor--medium 3.  Intravesical instillation of gemcitabine   Surgeon: Sherwood Edison, MD  Assistants: None  Anesthesia: General  Complications: None immediate  EBL: Minimal  Specimens: 1.  Bladder tumor  Drains/Catheters: 1.  18 French Foley catheter  Intraoperative findings: 1.  Normal anterior urethra 2.  Nonobstructing prostate 3.  Bladder mucosa with an approximately 2 to 2.5 cm papillary superficial appearing bladder tumor completely resected just to the left of the bladder neck  4.  Right retrograde pyelogram without any filling defect or hydronephrosis  5.  Left retrograde pyelogram without any filling defect or hydronephrosis  Indication: 73 year old male with history of high-grade bladder cancer with multiple recurrences presents with another recurrence.  Description of procedure:  The patient was identified and consent was obtained.  The patient was taken to the operating room and placed in the supine position.  The patient was placed under general anesthesia.  Perioperative antibiotics were administered.  The patient was placed in dorsal lithotomy.  Patient was prepped and draped in a standard sterile fashion and a timeout was performed.  A 21 French rigid cystoscope was advanced into the urethra and into the bladder.  Complete cystoscopy was performed with the findings noted above.  The left ureter was cannulated with an open-ended ureteral catheter and a retrograde pyelogram was performed with no abnormal findings.  Same was performed on the right again with no abnormal findings.  I withdrew the scope.  A 26 French resectoscope with the visual obturator in place was advanced into the urethra and into the bladder.  I exchanged for the bipolar working  element.  I resected the tumor of interest and collected it for specimen.  I fulgurated the resection bed.  There were no other tumors or masses.  I withdrew the scope and placed a Foley catheter.  Patient tolerated the procedure well was stable postoperatively  In the PACU, I instilled gemcitabine  into the bladder where it remained for proxy 1 hour prior to proper disposal.  Plan: Follow-up in 1 week for pathology review.  He needs to strongly consider additional adjuvant treatment such as Keytruda.

## 2024-07-15 NOTE — Discharge Instructions (Addendum)

## 2024-07-15 NOTE — Anesthesia Procedure Notes (Signed)
 Procedure Name: LMA Insertion Date/Time: 07/15/2024 2:45 PM  Performed by: Nada Corean CROME, CRNAPre-anesthesia Checklist: Emergency Drugs available, Patient identified, Suction available, Patient being monitored and Timeout performed Patient Re-evaluated:Patient Re-evaluated prior to induction Oxygen Delivery Method: Circle system utilized Preoxygenation: Pre-oxygenation with 100% oxygen Induction Type: IV induction Ventilation: Mask ventilation without difficulty LMA: LMA inserted LMA Size: 4.0 Number of attempts: 1 Placement Confirmation: positive ETCO2 and breath sounds checked- equal and bilateral Tube secured with: Tape Dental Injury: Teeth and Oropharynx as per pre-operative assessment

## 2024-07-15 NOTE — Transfer of Care (Signed)
 Immediate Anesthesia Transfer of Care Note  Patient: Jeremy Day  Procedure(s) Performed: TURBT, WITH CHEMOTHERAPEUTIC AGENT INSTILLATION INTO BLADDER CYSTOSCOPY, WITH RETROGRADE PYELOGRAM (Bilateral)  Patient Location: PACU  Anesthesia Type:General  Level of Consciousness: awake, alert , oriented, and patient cooperative  Airway & Oxygen Therapy: Patient Spontanous Breathing and Patient connected to face mask oxygen  Post-op Assessment: Report given to RN and Post -op Vital signs reviewed and stable  Post vital signs: Reviewed and stable  Last Vitals:  Vitals Value Taken Time  BP 130/82 07/15/24 15:40  Temp    Pulse 56 07/15/24 15:44  Resp 11 07/15/24 15:44  SpO2 100 % 07/15/24 15:44  Vitals shown include unfiled device data.  Last Pain:  Vitals:   07/15/24 1237  TempSrc:   PainSc: 0-No pain      Patients Stated Pain Goal: 3 (07/10/24 1004)  Complications: No notable events documented.

## 2024-07-16 ENCOUNTER — Ambulatory Visit: Attending: Cardiology | Admitting: Cardiology

## 2024-07-16 ENCOUNTER — Encounter (HOSPITAL_COMMUNITY): Payer: Self-pay | Admitting: Urology

## 2024-07-16 VITALS — BP 111/74 | HR 82 | Ht 72.0 in | Wt 200.0 lb

## 2024-07-16 DIAGNOSIS — D494 Neoplasm of unspecified behavior of bladder: Secondary | ICD-10-CM | POA: Diagnosis not present

## 2024-07-16 DIAGNOSIS — I77810 Thoracic aortic ectasia: Secondary | ICD-10-CM

## 2024-07-16 DIAGNOSIS — I493 Ventricular premature depolarization: Secondary | ICD-10-CM

## 2024-07-16 NOTE — Progress Notes (Signed)
  Cardiology Office Note:  .   Date:  07/16/2024  ID:  Jeremy Day, DOB Mar 24, 1951, MRN [id number] PCP: Chrystal Lamarr RAMAN, MD  [facility] Providers Cardiologist:  Oneil Parchment, MD     History of Present Illness: .   Jeremy Day is a 73 y.o. male Discussed the use of AI scribe software for clinical note transcription with the patient, who gave verbal consent to proceed.  History of Present Illness Jeremy Day is a 73 year old male with aortic root dilatation and ventricular bigeminy who presents for follow-up.  An echocardiogram from 2023 showed an aortic root measurement of 45 mm and an ascending aorta of 44 mm with a normal ejection fraction. No new cardiac symptoms, including palpitations or skipped beats, are reported.  He recently underwent bladder cancer surgery and feels 'a little shaky in the knees' but otherwise fine. He has been dealing with bladder tumors for the past five years, and this was his thirteenth procedure.  He is currently on metoprolol  12.5 mg daily. He ran out of his prescription two weeks ago but received an emergency supply. His blood pressure was high during his recent procedure.  Recent lab results from April 2025 show an LDL cholesterol of 67, hemoglobin A1c of 5.9, creatinine of 0.9, and hemoglobin of 15.      Studies Reviewed: .        Results LABS LDL cholesterol: 67 (02/2024) Hemoglobin A1c: 5.9 (02/2024) Creatinine: 0.9 (02/2024) Hemoglobin: 15 (02/2024)  DIAGNOSTIC Echocardiogram: Aortic root 45 mm, ascending aorta 44 mm, normal ejection fraction (2023) Risk Assessment/Calculations:            Physical Exam:   VS:  BP 111/74   Pulse 82   Ht 6' (1.829 m)   Wt 200 lb (90.7 kg)   SpO2 96%   BMI 27.12 kg/m    Wt Readings from Last 3 Encounters:  07/16/24 200 lb (90.7 kg)  07/15/24 200 lb (90.7 kg)  04/18/24 206 lb 6.4 oz (93.6 kg)    GEN: Well nourished, well developed in no acute distress NECK: No JVD; No carotid  bruits CARDIAC: RRR, no murmurs, no rubs, no gallops RESPIRATORY:  Clear to auscultation without rales, wheezing or rhonchi  ABDOMEN: Soft, non-tender, non-distended EXTREMITIES:  No edema; No deformity   ASSESSMENT AND PLAN: .    Assessment and Plan Assessment & Plan Aortic root and ascending aorta dilation Mild to moderate dilation of the aortic root and ascending aorta, with measurements of 45 mm and 44 mm respectively, as per the 2023 echocardiogram. The condition is not currently at a size that necessitates surgical intervention, which is typically considered when measurements exceed 50-55 mm. He is asymptomatic and understands the importance of avoiding heavy lifting to prevent exacerbation of the condition. - Order echocardiogram to monitor aortic root and ascending aorta dilation. - Advise to avoid heavy lifting to prevent increased pressure on the aorta.  Ventricular premature complexes (PVCs) Minimal PVCs with no reported palpitations or skipped beats. The condition is asymptomatic at this time.  Hypertension, controlled Blood pressure is well-controlled with current medication regimen. He is on a low dose of metoprolol  (12.5 mg daily), which he ran out of but received an emergency supply. - Continue to prescribe metoprolol  XL 12.5 mg daily.         Dispo: 1 yr  Signed, Oneil Parchment, MD

## 2024-07-16 NOTE — Patient Instructions (Signed)
 Medication Instructions:  The current medical regimen is effective;  continue present plan and medications.  *If you need a refill on your cardiac medications before your next appointment, please call your pharmacy*  Testing/Procedures: Your physician has requested that you have an echocardiogram. Echocardiography is a painless test that uses sound waves to create images of your heart. It provides your doctor with information about the size and shape of your heart and how well your heart's chambers and valves are working. This procedure takes approximately one hour. There are no restrictions for this procedure. Please do NOT wear cologne, perfume, aftershave, or lotions (deodorant is allowed). Please arrive 15 minutes prior to your appointment time.  Please note: We ask at that you not bring children with you during ultrasound (echo/ vascular) testing. Due to room size and safety concerns, children are not allowed in the ultrasound rooms during exams. Our front office staff cannot provide observation of children in our lobby area while testing is being conducted. An adult accompanying a patient to their appointment will only be allowed in the ultrasound room at the discretion of the ultrasound technician under special circumstances. We apologize for any inconvenience.  Follow-Up: At [facility], you and your health needs are our priority.  As part of our continuing mission to provide you with exceptional heart care, our providers are all part of one team.  This team includes your primary Cardiologist (physician) and Advanced Practice Providers or APPs (Physician Assistants and Nurse Practitioners) who all work together to provide you with the care you need, when you need it.  Your next appointment:   1 year(s)  Provider:   Dorothye Gathers, MD    We recommend signing up for the patient portal called "MyChart".  Sign up information is provided on this After Visit Summary.  MyChart is used to  connect with patients for Virtual Visits (Telemedicine).  Patients are able to view lab/test results, encounter notes, upcoming appointments, etc.  Non-urgent messages can be sent to your provider as well.   To learn more about what you can do with MyChart, go to [email or web address].

## 2024-07-16 NOTE — Anesthesia Postprocedure Evaluation (Signed)
 Anesthesia Post Note  Patient: LUNDY COZART  Procedure(s) Performed: TURBT, WITH CHEMOTHERAPEUTIC AGENT INSTILLATION INTO BLADDER CYSTOSCOPY, WITH RETROGRADE PYELOGRAM (Bilateral)     Patient location during evaluation: PACU Anesthesia Type: General Level of consciousness: awake and alert Pain management: pain level controlled Vital Signs Assessment: post-procedure vital signs reviewed and stable Respiratory status: spontaneous breathing, nonlabored ventilation, respiratory function stable and patient connected to nasal cannula oxygen Cardiovascular status: blood pressure returned to baseline and stable Postop Assessment: no apparent nausea or vomiting Anesthetic complications: no   No notable events documented.  Last Vitals:  Vitals:   07/15/24 1700 07/15/24 1734  BP: 137/87 (!) 146/92  Pulse: 61 62  Resp: 15 14  Temp:    SpO2: 94% 93%    Last Pain:  Vitals:   07/15/24 1734  TempSrc:   PainSc: 0-No pain                 Franky JONETTA Bald

## 2024-07-17 LAB — SURGICAL PATHOLOGY

## 2024-07-24 DIAGNOSIS — C678 Malignant neoplasm of overlapping sites of bladder: Secondary | ICD-10-CM | POA: Diagnosis not present

## 2024-08-20 ENCOUNTER — Ambulatory Visit (HOSPITAL_COMMUNITY)
Admission: RE | Admit: 2024-08-20 | Discharge: 2024-08-20 | Disposition: A | Discharge status: Home / Self Care | Source: Ambulatory Visit | Attending: Cardiology | Admitting: Cardiology

## 2024-08-20 DIAGNOSIS — R002 Palpitations: Secondary | ICD-10-CM | POA: Insufficient documentation

## 2024-08-20 DIAGNOSIS — I10 Essential (primary) hypertension: Secondary | ICD-10-CM | POA: Diagnosis not present

## 2024-08-20 DIAGNOSIS — I493 Ventricular premature depolarization: Secondary | ICD-10-CM | POA: Insufficient documentation

## 2024-08-20 DIAGNOSIS — I77810 Thoracic aortic ectasia: Secondary | ICD-10-CM | POA: Diagnosis not present

## 2024-08-20 DIAGNOSIS — Z87891 Personal history of nicotine dependence: Secondary | ICD-10-CM | POA: Insufficient documentation

## 2024-08-20 DIAGNOSIS — Z8551 Personal history of malignant neoplasm of bladder: Secondary | ICD-10-CM | POA: Diagnosis not present

## 2024-08-21 LAB — ECHOCARDIOGRAM COMPLETE
Area-P 1/2: 2.64 cm2
P 1/2 time: 566 ms
S' Lateral: 3 cm

## 2024-08-24 ENCOUNTER — Ambulatory Visit: Payer: Self-pay | Admitting: Cardiology

## 2024-09-03 ENCOUNTER — Telehealth: Payer: Self-pay | Admitting: Cardiology

## 2024-09-03 MED ORDER — METOPROLOL SUCCINATE ER 25 MG PO TB24: 12.5000 mg | ORAL_TABLET | Freq: Every day | ORAL | 3 refills | Status: AC

## 2024-09-03 NOTE — Telephone Encounter (Signed)
*  STAT* If patient is at the pharmacy, call can be transferred to refill team.   1. Which medications need to be refilled? (please list name of each medication and dose if known)   metoprolol  succinate (TOPROL -XL) 25 MG 24 hr tablet    2. Which pharmacy/location (including street and city if local pharmacy) is medication to be sent to? [id number] - [place], [place] - [address] Phone: [phone]  Fax: [phone]      3. Do they need a 30 day or 90 day supply? 90  Pt is out of medication

## 2024-09-03 NOTE — Telephone Encounter (Signed)
 Pt's medication was sent to pt's pharmacy as requested. Confirmation received.

## 2024-09-05 ENCOUNTER — Other Ambulatory Visit: Payer: Self-pay | Admitting: *Deleted

## 2024-09-05 DIAGNOSIS — I77810 Thoracic aortic ectasia: Secondary | ICD-10-CM

## 2024-09-06 DIAGNOSIS — R399 Unspecified symptoms and signs involving the genitourinary system: Secondary | ICD-10-CM | POA: Diagnosis not present

## 2024-09-06 DIAGNOSIS — C673 Malignant neoplasm of anterior wall of bladder: Secondary | ICD-10-CM | POA: Diagnosis not present
